# Patient Record
Sex: Female | Born: 1960 | Race: White | Hispanic: No | Marital: Married | Smoking: Former smoker
Health system: Southern US, Community
[De-identification: ages and names within clinical notes are randomized; demographics above are authoritative.]

## PROBLEM LIST (undated history)

## (undated) DIAGNOSIS — M1990 Unspecified osteoarthritis, unspecified site: Secondary | ICD-10-CM

## (undated) DIAGNOSIS — Z923 Personal history of irradiation: Secondary | ICD-10-CM

## (undated) DIAGNOSIS — M810 Age-related osteoporosis without current pathological fracture: Secondary | ICD-10-CM

## (undated) DIAGNOSIS — F419 Anxiety disorder, unspecified: Secondary | ICD-10-CM

## (undated) DIAGNOSIS — F32A Depression, unspecified: Secondary | ICD-10-CM

## (undated) DIAGNOSIS — Z862 Personal history of diseases of the blood and blood-forming organs and certain disorders involving the immune mechanism: Secondary | ICD-10-CM

## (undated) DIAGNOSIS — Z9221 Personal history of antineoplastic chemotherapy: Secondary | ICD-10-CM

## (undated) DIAGNOSIS — Z8619 Personal history of other infectious and parasitic diseases: Secondary | ICD-10-CM

## (undated) DIAGNOSIS — G709 Myoneural disorder, unspecified: Secondary | ICD-10-CM

## (undated) DIAGNOSIS — D649 Anemia, unspecified: Secondary | ICD-10-CM

## (undated) DIAGNOSIS — I447 Left bundle-branch block, unspecified: Secondary | ICD-10-CM

## (undated) DIAGNOSIS — T751XXA Unspecified effects of drowning and nonfatal submersion, initial encounter: Secondary | ICD-10-CM

## (undated) DIAGNOSIS — R011 Cardiac murmur, unspecified: Secondary | ICD-10-CM

## (undated) DIAGNOSIS — Z8669 Personal history of other diseases of the nervous system and sense organs: Secondary | ICD-10-CM

## (undated) DIAGNOSIS — F17200 Nicotine dependence, unspecified, uncomplicated: Secondary | ICD-10-CM

## (undated) DIAGNOSIS — T7840XA Allergy, unspecified, initial encounter: Secondary | ICD-10-CM

## (undated) DIAGNOSIS — C50919 Malignant neoplasm of unspecified site of unspecified female breast: Secondary | ICD-10-CM

## (undated) DIAGNOSIS — Z8659 Personal history of other mental and behavioral disorders: Secondary | ICD-10-CM

## (undated) DIAGNOSIS — C50912 Malignant neoplasm of unspecified site of left female breast: Secondary | ICD-10-CM

## (undated) HISTORY — PX: POLYPECTOMY: SHX149

## (undated) HISTORY — PX: TUBAL LIGATION: SHX77

## (undated) HISTORY — DX: Personal history of other mental and behavioral disorders: Z86.59

## (undated) HISTORY — DX: Malignant neoplasm of unspecified site of unspecified female breast: C50.919

## (undated) HISTORY — DX: Myoneural disorder, unspecified: G70.9

## (undated) HISTORY — DX: Personal history of other diseases of the nervous system and sense organs: Z86.69

## (undated) HISTORY — DX: Depression, unspecified: F32.A

## (undated) HISTORY — DX: Unspecified osteoarthritis, unspecified site: M19.90

## (undated) HISTORY — DX: Personal history of other infectious and parasitic diseases: Z86.19

## (undated) HISTORY — DX: Allergy, unspecified, initial encounter: T78.40XA

## (undated) HISTORY — DX: Anemia, unspecified: D64.9

## (undated) HISTORY — DX: Cardiac murmur, unspecified: R01.1

## (undated) HISTORY — DX: Age-related osteoporosis without current pathological fracture: M81.0

## (undated) HISTORY — DX: Unspecified effects of drowning and nonfatal submersion, initial encounter: T75.1XXA

## (undated) HISTORY — DX: Nicotine dependence, unspecified, uncomplicated: F17.200

## (undated) HISTORY — DX: Anxiety disorder, unspecified: F41.9

## (undated) HISTORY — DX: Personal history of diseases of the blood and blood-forming organs and certain disorders involving the immune mechanism: Z86.2

## (undated) HISTORY — DX: Malignant neoplasm of unspecified site of left female breast: C50.912

---
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
## 1985-11-04 HISTORY — PX: TONSILLECTOMY: SUR1361

## 1995-11-05 HISTORY — PX: KNEE SURGERY: SHX244

## 1999-10-04 ENCOUNTER — Emergency Department (HOSPITAL_COMMUNITY): Admission: EM | Admit: 1999-10-04 | Discharge: 1999-10-04 | Payer: Self-pay | Admitting: Emergency Medicine

## 2001-11-04 DIAGNOSIS — C50912 Malignant neoplasm of unspecified site of left female breast: Secondary | ICD-10-CM

## 2001-11-04 HISTORY — PX: BREAST LUMPECTOMY: SHX2

## 2001-11-04 HISTORY — DX: Malignant neoplasm of unspecified site of left female breast: C50.912

## 2002-09-13 ENCOUNTER — Encounter: Admission: RE | Admit: 2002-09-13 | Discharge: 2002-09-13 | Payer: Self-pay | Admitting: Family Medicine

## 2002-09-13 ENCOUNTER — Encounter: Payer: Self-pay | Admitting: Family Medicine

## 2002-09-13 ENCOUNTER — Other Ambulatory Visit: Admission: RE | Admit: 2002-09-13 | Discharge: 2002-09-13 | Payer: Self-pay | Admitting: Diagnostic Radiology

## 2002-09-13 ENCOUNTER — Encounter (INDEPENDENT_AMBULATORY_CARE_PROVIDER_SITE_OTHER): Payer: Self-pay | Admitting: *Deleted

## 2002-09-21 ENCOUNTER — Ambulatory Visit (HOSPITAL_COMMUNITY): Admission: RE | Admit: 2002-09-21 | Discharge: 2002-09-21 | Payer: Self-pay | Admitting: *Deleted

## 2002-09-21 ENCOUNTER — Encounter: Payer: Self-pay | Admitting: *Deleted

## 2002-09-23 ENCOUNTER — Encounter: Payer: Self-pay | Admitting: *Deleted

## 2002-09-23 ENCOUNTER — Ambulatory Visit (HOSPITAL_COMMUNITY): Admission: RE | Admit: 2002-09-23 | Discharge: 2002-09-23 | Payer: Self-pay | Admitting: *Deleted

## 2002-09-27 ENCOUNTER — Encounter: Payer: Self-pay | Admitting: General Surgery

## 2002-09-27 ENCOUNTER — Ambulatory Visit (HOSPITAL_BASED_OUTPATIENT_CLINIC_OR_DEPARTMENT_OTHER): Admission: RE | Admit: 2002-09-27 | Discharge: 2002-09-27 | Payer: Self-pay | Admitting: General Surgery

## 2002-10-14 ENCOUNTER — Encounter: Admission: RE | Admit: 2002-10-14 | Discharge: 2002-10-14 | Payer: Self-pay | Admitting: General Surgery

## 2002-10-14 ENCOUNTER — Encounter: Payer: Self-pay | Admitting: General Surgery

## 2002-12-17 ENCOUNTER — Ambulatory Visit (HOSPITAL_COMMUNITY): Admission: RE | Admit: 2002-12-17 | Discharge: 2002-12-17 | Payer: Self-pay | Admitting: General Surgery

## 2002-12-17 ENCOUNTER — Encounter: Payer: Self-pay | Admitting: General Surgery

## 2002-12-23 ENCOUNTER — Encounter: Admission: RE | Admit: 2002-12-23 | Discharge: 2002-12-23 | Payer: Self-pay | Admitting: General Surgery

## 2002-12-23 ENCOUNTER — Encounter: Payer: Self-pay | Admitting: General Surgery

## 2002-12-27 ENCOUNTER — Encounter (INDEPENDENT_AMBULATORY_CARE_PROVIDER_SITE_OTHER): Payer: Self-pay | Admitting: *Deleted

## 2002-12-27 ENCOUNTER — Ambulatory Visit (HOSPITAL_BASED_OUTPATIENT_CLINIC_OR_DEPARTMENT_OTHER): Admission: RE | Admit: 2002-12-27 | Discharge: 2002-12-27 | Payer: Self-pay | Admitting: General Surgery

## 2002-12-27 ENCOUNTER — Encounter: Admission: RE | Admit: 2002-12-27 | Discharge: 2002-12-27 | Payer: Self-pay | Admitting: General Surgery

## 2002-12-27 ENCOUNTER — Encounter: Payer: Self-pay | Admitting: General Surgery

## 2003-01-06 ENCOUNTER — Ambulatory Visit: Admission: RE | Admit: 2003-01-06 | Discharge: 2003-01-17 | Payer: Self-pay | Admitting: Radiation Oncology

## 2003-01-12 ENCOUNTER — Encounter: Admission: RE | Admit: 2003-01-12 | Discharge: 2003-01-12 | Payer: Self-pay | Admitting: Radiation Oncology

## 2003-01-18 ENCOUNTER — Ambulatory Visit (HOSPITAL_BASED_OUTPATIENT_CLINIC_OR_DEPARTMENT_OTHER): Admission: RE | Admit: 2003-01-18 | Discharge: 2003-01-18 | Payer: Self-pay | Admitting: General Surgery

## 2003-01-18 ENCOUNTER — Encounter: Payer: Self-pay | Admitting: General Surgery

## 2003-01-18 ENCOUNTER — Encounter (INDEPENDENT_AMBULATORY_CARE_PROVIDER_SITE_OTHER): Payer: Self-pay | Admitting: *Deleted

## 2003-01-18 ENCOUNTER — Encounter: Admission: RE | Admit: 2003-01-18 | Discharge: 2003-01-18 | Payer: Self-pay | Admitting: General Surgery

## 2003-01-24 ENCOUNTER — Ambulatory Visit: Admission: RE | Admit: 2003-01-24 | Discharge: 2003-04-06 | Payer: Self-pay | Admitting: Radiation Oncology

## 2003-03-19 ENCOUNTER — Encounter: Payer: Self-pay | Admitting: *Deleted

## 2003-03-19 ENCOUNTER — Inpatient Hospital Stay (HOSPITAL_COMMUNITY): Admission: EM | Admit: 2003-03-19 | Discharge: 2003-03-21 | Payer: Self-pay | Admitting: Emergency Medicine

## 2003-03-31 ENCOUNTER — Ambulatory Visit (HOSPITAL_COMMUNITY): Admission: RE | Admit: 2003-03-31 | Discharge: 2003-03-31 | Payer: Self-pay | Admitting: Radiation Oncology

## 2003-04-18 ENCOUNTER — Encounter: Payer: Self-pay | Admitting: Oncology

## 2003-04-18 ENCOUNTER — Ambulatory Visit (HOSPITAL_COMMUNITY): Admission: RE | Admit: 2003-04-18 | Discharge: 2003-04-18 | Payer: Self-pay | Admitting: Oncology

## 2003-05-05 ENCOUNTER — Ambulatory Visit: Admission: RE | Admit: 2003-05-05 | Discharge: 2003-05-05 | Payer: Self-pay | Admitting: Radiation Oncology

## 2003-06-27 ENCOUNTER — Ambulatory Visit (HOSPITAL_BASED_OUTPATIENT_CLINIC_OR_DEPARTMENT_OTHER): Admission: RE | Admit: 2003-06-27 | Discharge: 2003-06-27 | Payer: Self-pay | Admitting: General Surgery

## 2003-09-20 ENCOUNTER — Encounter: Admission: RE | Admit: 2003-09-20 | Discharge: 2003-09-20 | Payer: Self-pay | Admitting: Oncology

## 2003-12-08 ENCOUNTER — Ambulatory Visit: Admission: RE | Admit: 2003-12-08 | Discharge: 2003-12-08 | Payer: Self-pay | Admitting: Radiation Oncology

## 2004-09-03 ENCOUNTER — Encounter: Admission: RE | Admit: 2004-09-03 | Discharge: 2004-09-03 | Payer: Self-pay | Admitting: Oncology

## 2004-10-25 ENCOUNTER — Ambulatory Visit: Payer: Self-pay | Admitting: Oncology

## 2004-10-25 ENCOUNTER — Ambulatory Visit (HOSPITAL_COMMUNITY): Admission: RE | Admit: 2004-10-25 | Discharge: 2004-10-25 | Payer: Self-pay | Admitting: Oncology

## 2004-11-23 ENCOUNTER — Encounter: Admission: RE | Admit: 2004-11-23 | Discharge: 2004-11-23 | Payer: Self-pay | Admitting: Otolaryngology

## 2004-12-28 ENCOUNTER — Ambulatory Visit: Payer: Self-pay | Admitting: Oncology

## 2005-01-17 ENCOUNTER — Encounter: Admission: RE | Admit: 2005-01-17 | Discharge: 2005-01-17 | Payer: Self-pay | Admitting: Otolaryngology

## 2005-04-08 ENCOUNTER — Ambulatory Visit: Payer: Self-pay | Admitting: Oncology

## 2005-07-11 ENCOUNTER — Ambulatory Visit (HOSPITAL_COMMUNITY): Admission: RE | Admit: 2005-07-11 | Discharge: 2005-07-11 | Payer: Self-pay | Admitting: Oncology

## 2005-07-24 ENCOUNTER — Ambulatory Visit: Payer: Self-pay | Admitting: Oncology

## 2005-08-01 ENCOUNTER — Ambulatory Visit (HOSPITAL_COMMUNITY): Admission: RE | Admit: 2005-08-01 | Discharge: 2005-08-01 | Payer: Self-pay | Admitting: Oncology

## 2005-08-02 ENCOUNTER — Ambulatory Visit (HOSPITAL_COMMUNITY): Admission: RE | Admit: 2005-08-02 | Discharge: 2005-08-02 | Payer: Self-pay | Admitting: Oncology

## 2005-08-15 ENCOUNTER — Encounter: Admission: RE | Admit: 2005-08-15 | Discharge: 2005-08-15 | Payer: Self-pay | Admitting: General Surgery

## 2005-09-09 ENCOUNTER — Ambulatory Visit: Payer: Self-pay | Admitting: Oncology

## 2005-12-09 ENCOUNTER — Ambulatory Visit: Payer: Self-pay | Admitting: Oncology

## 2006-02-03 ENCOUNTER — Ambulatory Visit: Admission: RE | Admit: 2006-02-03 | Discharge: 2006-03-17 | Payer: Self-pay | Admitting: Radiation Oncology

## 2006-04-02 ENCOUNTER — Ambulatory Visit: Payer: Self-pay | Admitting: Oncology

## 2006-05-08 ENCOUNTER — Encounter: Admission: RE | Admit: 2006-05-08 | Discharge: 2006-05-08 | Payer: Self-pay | Admitting: Oncology

## 2006-08-18 ENCOUNTER — Encounter: Admission: RE | Admit: 2006-08-18 | Discharge: 2006-08-18 | Payer: Self-pay | Admitting: Oncology

## 2006-08-21 ENCOUNTER — Ambulatory Visit: Payer: Self-pay | Admitting: Oncology

## 2006-09-16 ENCOUNTER — Encounter: Admission: RE | Admit: 2006-09-16 | Discharge: 2006-09-16 | Payer: Self-pay | Admitting: Oncology

## 2006-12-24 ENCOUNTER — Ambulatory Visit: Payer: Self-pay | Admitting: Oncology

## 2007-02-03 ENCOUNTER — Emergency Department: Payer: Self-pay | Admitting: Emergency Medicine

## 2007-06-18 ENCOUNTER — Emergency Department: Payer: Self-pay

## 2007-06-28 ENCOUNTER — Ambulatory Visit: Payer: Self-pay | Admitting: Oncology

## 2007-09-09 ENCOUNTER — Encounter: Admission: RE | Admit: 2007-09-09 | Discharge: 2007-09-09 | Payer: Self-pay | Admitting: Oncology

## 2007-09-19 ENCOUNTER — Emergency Department (HOSPITAL_COMMUNITY): Admission: EM | Admit: 2007-09-19 | Discharge: 2007-09-19 | Payer: Self-pay | Admitting: Emergency Medicine

## 2007-10-16 ENCOUNTER — Ambulatory Visit: Payer: Self-pay | Admitting: Oncology

## 2007-10-20 LAB — TSH: TSH: 2.012 u[IU]/mL (ref 0.350–5.500)

## 2008-01-14 ENCOUNTER — Ambulatory Visit: Payer: Self-pay | Admitting: Oncology

## 2008-01-14 ENCOUNTER — Ambulatory Visit (HOSPITAL_COMMUNITY): Admission: RE | Admit: 2008-01-14 | Discharge: 2008-01-14 | Payer: Self-pay | Admitting: Oncology

## 2008-01-20 ENCOUNTER — Ambulatory Visit (HOSPITAL_COMMUNITY): Admission: RE | Admit: 2008-01-20 | Discharge: 2008-01-20 | Payer: Self-pay | Admitting: Oncology

## 2008-01-25 ENCOUNTER — Ambulatory Visit (HOSPITAL_COMMUNITY): Admission: RE | Admit: 2008-01-25 | Discharge: 2008-01-25 | Payer: Self-pay | Admitting: Oncology

## 2008-02-05 LAB — CBC WITH DIFFERENTIAL/PLATELET
Eosinophils Absolute: 0 10*3/uL (ref 0.0–0.5)
HCT: 41.4 % (ref 34.8–46.6)
LYMPH%: 12 % — ABNORMAL LOW (ref 14.0–48.0)
MCV: 86.4 fL (ref 81.0–101.0)
MONO#: 0.8 10*3/uL (ref 0.1–0.9)
MONO%: 6.3 % (ref 0.0–13.0)
NEUT#: 10.3 10*3/uL — ABNORMAL HIGH (ref 1.5–6.5)
NEUT%: 81.1 % — ABNORMAL HIGH (ref 39.6–76.8)
Platelets: 318 10*3/uL (ref 145–400)
RBC: 4.79 10*6/uL (ref 3.70–5.32)
WBC: 12.6 10*3/uL — ABNORMAL HIGH (ref 3.9–10.0)

## 2008-02-05 LAB — COMPREHENSIVE METABOLIC PANEL
Alkaline Phosphatase: 75 U/L (ref 39–117)
BUN: 21 mg/dL (ref 6–23)
CO2: 26 mEq/L (ref 19–32)
Creatinine, Ser: 1.02 mg/dL (ref 0.40–1.20)
Glucose, Bld: 93 mg/dL (ref 70–99)
Total Bilirubin: 0.4 mg/dL (ref 0.3–1.2)
Total Protein: 7.7 g/dL (ref 6.0–8.3)

## 2008-02-05 LAB — CANCER ANTIGEN 27.29: CA 27.29: 21 U/mL (ref 0–39)

## 2008-02-05 LAB — LACTATE DEHYDROGENASE: LDH: 160 U/L (ref 94–250)

## 2008-02-26 ENCOUNTER — Emergency Department (HOSPITAL_COMMUNITY): Admission: EM | Admit: 2008-02-26 | Discharge: 2008-02-26 | Payer: Self-pay | Admitting: Emergency Medicine

## 2008-03-01 ENCOUNTER — Ambulatory Visit: Payer: Self-pay | Admitting: Oncology

## 2008-03-10 ENCOUNTER — Ambulatory Visit (HOSPITAL_COMMUNITY): Admission: RE | Admit: 2008-03-10 | Discharge: 2008-03-10 | Payer: Self-pay | Admitting: Oncology

## 2008-03-14 ENCOUNTER — Ambulatory Visit: Payer: Self-pay | Admitting: Oncology

## 2008-06-09 ENCOUNTER — Ambulatory Visit: Payer: Self-pay | Admitting: Oncology

## 2008-06-23 ENCOUNTER — Other Ambulatory Visit: Admission: RE | Admit: 2008-06-23 | Discharge: 2008-06-23 | Payer: Self-pay | Admitting: Gynecology

## 2008-09-09 ENCOUNTER — Encounter: Admission: RE | Admit: 2008-09-09 | Discharge: 2008-09-09 | Payer: Self-pay | Admitting: Oncology

## 2008-09-09 ENCOUNTER — Ambulatory Visit: Payer: Self-pay | Admitting: Oncology

## 2008-09-14 ENCOUNTER — Ambulatory Visit: Payer: Self-pay | Admitting: Gynecology

## 2009-03-14 ENCOUNTER — Ambulatory Visit: Payer: Self-pay | Admitting: Oncology

## 2009-05-12 ENCOUNTER — Ambulatory Visit: Payer: Self-pay | Admitting: Genetic Counselor

## 2009-09-11 ENCOUNTER — Encounter: Admission: RE | Admit: 2009-09-11 | Discharge: 2009-09-11 | Payer: Self-pay | Admitting: Oncology

## 2009-09-22 ENCOUNTER — Encounter
Admission: RE | Admit: 2009-09-22 | Discharge: 2009-11-01 | Payer: Self-pay | Admitting: Physical Medicine & Rehabilitation

## 2009-09-25 ENCOUNTER — Ambulatory Visit (HOSPITAL_COMMUNITY)
Admission: RE | Admit: 2009-09-25 | Discharge: 2009-09-25 | Payer: Self-pay | Admitting: Physical Medicine and Rehabilitation

## 2009-09-25 ENCOUNTER — Ambulatory Visit: Payer: Self-pay | Admitting: Physical Medicine and Rehabilitation

## 2010-03-14 ENCOUNTER — Ambulatory Visit: Payer: Self-pay | Admitting: Oncology

## 2010-07-14 ENCOUNTER — Emergency Department: Payer: Self-pay | Admitting: Emergency Medicine

## 2010-09-06 ENCOUNTER — Ambulatory Visit: Payer: Self-pay | Admitting: Oncology

## 2010-10-05 ENCOUNTER — Encounter: Admission: RE | Admit: 2010-10-05 | Discharge: 2010-10-05 | Payer: Self-pay | Admitting: Oncology

## 2010-10-11 ENCOUNTER — Encounter
Admission: RE | Admit: 2010-10-11 | Discharge: 2010-10-11 | Payer: Self-pay | Source: Home / Self Care | Attending: Oncology | Admitting: Oncology

## 2010-11-04 HISTORY — PX: BREAST BIOPSY: SHX20

## 2010-11-14 ENCOUNTER — Emergency Department (HOSPITAL_COMMUNITY)
Admission: EM | Admit: 2010-11-14 | Discharge: 2010-11-14 | Payer: Self-pay | Source: Home / Self Care | Admitting: Emergency Medicine

## 2010-11-24 ENCOUNTER — Encounter: Payer: Self-pay | Admitting: Oncology

## 2010-12-11 ENCOUNTER — Encounter (HOSPITAL_BASED_OUTPATIENT_CLINIC_OR_DEPARTMENT_OTHER): Payer: Self-pay | Admitting: Oncology

## 2010-12-11 DIAGNOSIS — Z853 Personal history of malignant neoplasm of breast: Secondary | ICD-10-CM

## 2011-02-11 ENCOUNTER — Emergency Department (HOSPITAL_COMMUNITY)
Admission: EM | Admit: 2011-02-11 | Discharge: 2011-02-11 | Disposition: A | Discharge status: Home / Self Care | Payer: Self-pay | Attending: Emergency Medicine | Admitting: Emergency Medicine

## 2011-02-11 ENCOUNTER — Emergency Department (HOSPITAL_COMMUNITY): Payer: Self-pay

## 2011-02-11 DIAGNOSIS — Z853 Personal history of malignant neoplasm of breast: Secondary | ICD-10-CM | POA: Insufficient documentation

## 2011-02-11 DIAGNOSIS — M79609 Pain in unspecified limb: Secondary | ICD-10-CM | POA: Insufficient documentation

## 2011-03-22 NOTE — Op Note (Signed)
   NAMESHERRI, MCARTHY                          ACCOUNT NO.:  [id number]   MEDICAL RECORD NO.:  [id number]                   PATIENT TYPE:  AMB   LOCATION:  DSC                                  FACILITY:  [facility]   PHYSICIAN:  Rose Phi. Maple Hudson, M.D.                DATE OF BIRTH:  10-10-1961   DATE OF PROCEDURE:  01/18/2003  DATE OF DISCHARGE:                                 OPERATIVE REPORT   PREOPERATIVE DIAGNOSIS:  Residual intramammary node, left breast.   POSTOPERATIVE DIAGNOSIS:  Residual intramammary node, left breast.   OPERATION PERFORMED:  Excision of residual intramammary node, left breast  with needle localization and specimen mammography.   SURGEON:  Rose Phi. Maple Hudson, M.D.   ANESTHESIA:  MAC.   DESCRIPTION OF PROCEDURE:  Prior to coming to the operating room, the  intramammary node of the upper outer quadrant of the left breast had been  localized with a wire.  The patient was placed on the operating table with  the left arm extended on the arm board.  The left breast was prepped and  draped in the usual fashion.  A curved incision was then outlined using the  previously placed wire as a point.  The area was infiltrated with 1%  Xylocaine with Adrenalin.  The incision was made and the wire delivered into  the incision and then the wire and surrounding tissue excised.  Specimen  mammography confirmed the removal of the nodule.  Hemostasis obtained with  the cautery.  Subcuticular closure with 4-0 Monocryl and Steri-Strips  carried out.  Dressing applied.  The patient was then transferred to the  recovery room in satisfactory condition having tolerated the procedure well.                                                Rose Phi. Maple Hudson, M.D.    PRY/MEDQ  D:  01/18/2003  T:  01/18/2003  Job:  [id number]

## 2011-03-22 NOTE — Discharge Summary (Signed)
   Sabrina Wright, Sabrina Wright                          ACCOUNT NO.:  [id number]   MEDICAL RECORD NO.:  [id number]                   PATIENT TYPE:  INP   LOCATION:  0281                                 FACILITY:  [facility]   PHYSICIAN:  Merilynn Finland, M.D.                DATE OF BIRTH:  10/04/1961   DATE OF ADMISSION:  03/19/2003  DATE OF DISCHARGE:  03/21/2003                                 DISCHARGE SUMMARY   DISCHARGE SUMMARY:  Sabrina Wright is a very pleasant 50 year old female with a  history of breast cancer who had undergone radiation therapy and completion  of all of her adjunct treatment who had a subacute onset on the day prior to  admission of back pain radiating to both shoulders.  It was worse when she  had a cough or a deep breath.  She was admitted and had a thorough work up  including chest x-ray, a VQ scan and CT scan all of these things revealed no  abnormality.  EKG was normal.  She was given prednisone with a great deal of  relief of her pain and she was discharged home on 60 mg a day of prednisone  and Percocet as needed for pain.  She was discharged in stable condition and  she will followup with Dr. Aliene Altes one week after discharge.                                                Merilynn Finland, M.D.    JSS/MEDQ  D:  03/30/2003  T:  03/30/2003  Job:  [id number]

## 2011-03-22 NOTE — Op Note (Signed)
   Sabrina Wright, Sabrina Wright                          ACCOUNT NO.:  [id number]   MEDICAL RECORD NO.:  [id number]                   PATIENT TYPE:  AMB   LOCATION:  DSC                                  FACILITY:  [facility]   PHYSICIAN:  Rose Phi. Maple Hudson, M.D.                DATE OF BIRTH:  1961/10/21   DATE OF PROCEDURE:  06/27/2003  DATE OF DISCHARGE:                                 OPERATIVE REPORT   PREOPERATIVE DIAGNOSIS:  Carcinoma of the left breast.   POSTOPERATIVE DIAGNOSIS:  Carcinoma of the left breast.   OPERATION:  Removal of porta-cath.   SURGEON:  Rose Phi. Maple Hudson, M.D.   ANESTHESIA:  Local.   DESCRIPTION OF PROCEDURE:  The patient was placed on the operating table and  the right upper  chest was prepped and draped in the usual sterile fashion.  Local infiltration with 1% Xylocaine with adrenalin was then used and the  incision was made and exposed the port.   I grasped the catheter and removed it and then elevated the port and divided  the 2 stitches holding it in place and slipped it out. There was no  bleeding. The wound was closed with subcuticular 4-0 Monocryl and Steri-  Strips. A dressing was applied. The patient was then allowed to go home.                                               Rose Phi. Maple Hudson, M.D.    PRY/MEDQ  D:  06/27/2003  T:  06/27/2003  Job:  [id number]

## 2011-03-22 NOTE — H&P (Signed)
   NAME:  Sabrina Wright, Sabrina Wright NO.:  [id number]   MEDICAL RECORD NO.:  [id number]                   PATIENT TYPE:  INP   LOCATION:                                       FACILITY:  [facility]   PHYSICIAN:  Merilynn Finland, M.D.                DATE OF BIRTH:  1960/12/21   DATE OF ADMISSION:  03/19/2003  DATE OF DISCHARGE:  03/21/2003                                HISTORY & PHYSICAL   HISTORY OF PRESENT ILLNESS:  The patient is a very pleasant 50 year old  female with a history of breast cancer now undergoing radiation therapy and  completion all of her adjutant treatment with subacute onset yesterday of  back pain radiating to both shoulders.  The pain is worse with a cough and  deep inspiration.  She has had no fever or chills, no leg pain, no  productive cough, no sweat.  She is admitted for further workup to include a  CT scan of her chest, an EKG and pain control.   PAST MEDICAL HISTORY:  1. Significant  for the above mentioned breast cancer.  2. Status post now lumpectomy, February 2004.   SOCIAL HISTORY:  Smoking history.  No alcohol.   MEDICATIONS:  Currently on no medications.   ALLERGIES:  No known drug allergies.   FAMILY HISTORY:  Noncontributory.   PHYSICAL EXAMINATION:  VITAL SIGNS:  She is afebrile.  Vitals are stable.  She is 98% on room air.  HEART:  Regular, rate and rhythm.  ABDOMEN:  Soft, nontender.  CHEST:  Clear.  EXTREMITIES:  Range of motion.  No edema.   IMPRESSION:  A 50 year old female with a history of breast cancer undergoing  radiation at this point with subacute onset of back pain radiating to both  shoulders.  At this point the differential would have to include pulmonary  emboli, as well as pneumonitis.  At this point she is having excruciating  pain.  We are going to get pain control.  We are going to get a CT scan of  her chest to rule out a pulmonary embolus and also an EKG.           Merilynn Finland, M.D.    JSS/MEDQ  D:  03/19/2003  T:  03/19/2003  Job:  [id number]

## 2011-03-22 NOTE — Op Note (Signed)
[name], Sabrina Wright                          ACCOUNT NO.:  [id number]   MEDICAL RECORD NO.:  [id number]                   PATIENT TYPE:  AMB   LOCATION:  DSC                                  FACILITY:  [facility]   PHYSICIAN:  Rose Phi. Maple Hudson, M.D.                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   PROCEDURES:  1. Blue dye injection.  2. Left partial mastectomy with needle localization and specimen     mammography.  3. Left sentinel lymph node biopsy.   SURGEON:  Rose Phi. Maple Hudson, M.D.   ANESTHESIA:  General.   DESCRIPTION OF PROCEDURE:  This 50 year old married female had presented in  November with a 6 cm carcinoma in the upper inner quadrant of her left  breast.  She received preop chemotherapy with a total clinical resolution of  the palpable tumor and absence of any enhancing tissue on MRI.  She is  scheduled now for a partial mastectomy and sentinel node biopsy.   Prior to coming to the operating room, 1 mCi of Technetium sulfur colloid  was injected intradermally as well as having a localization wire placed in  the upper inner quadrant to identify where the previously-placed clip was.   After suitable general anesthesia was induced, the patient was placed in the  supine position with the left arm extended on the arm board.  Lymphazurin  blue 5 mL was injected in the subareolar tissue.   After prepping and draping, a curved incision in the upper inner quadrant of  the left breast was made and wide excision of the wire and surrounding  tissue was carried out.  All the tissue looked normal.  It was oriented for  the pathologist.  Specimen mammography confirmed the removal of the clip,  and touch prep showed no evidence of tumor on the margins.   While that was being done, we scanned the left axilla with the Neoprobe and  there was one real hot spot.  A transverse axillary incision was made with  dissection through the subcutaneous tissues to the clavipectoral fascia.  A  hot and blue lymph node was identified, and it was removed and submitted as  a sentinel node.  There were no other palpable blue or hot nodes.  Touch  prep on the node was negative.   With that information and good hemostasis, all the incisions were closed  with 3-0 Vicryl and subcuticular 4-0 Monocryl and Steri-Strips.  Dressing  applied.  Patient transferred to the recovery room in satisfactory  condition, having tolerated the procedure well.  Rose Phi. Maple Hudson, M.D.    PRY/MEDQ  D:  12/27/2002  T:  12/27/2002  Job:  [id number]   cc:   Merilynn Finland, M.D.  [address] [place] [facility]  [place]  [place] [address]  Fax: [address]  [address]  [place]  [place] [address]  Fax: [phone]

## 2011-03-22 NOTE — Op Note (Signed)
   Sabrina Wright, Sabrina Wright                          ACCOUNT NO.:  [id number]   MEDICAL RECORD NO.:  [id number]                   PATIENT TYPE:  AMB   LOCATION:  DSC                                  FACILITY:  [facility]   PHYSICIAN:  Rose Phi. Maple Hudson, M.D.                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   OPERATION/PROCEDURE:  Insertion of Port-A-Cath.   SURGEON:  Rose Phi. Maple Hudson, M.D.   ANESTHESIA:  MAC.   DESCRIPTION OF PROCEDURE:  With a roll between the shoulders, this patient  was placed on the operating room table and the right upper chest and neck  prepped and draped in the usual fashion.  Under local anesthesia we  attempted on several occasions to do a right subclavian puncture but could  not accomplish it.  A right internal jugular puncture was then carried out  by Dr. Gelene Mink with the guide wire inserted with great ease.  We then made a transverse incision on the anterior chest wall and developed  a pocket for the implantable X-port. Then tunneled between the IJ puncture  site and the port and passed the catheter and then attached it to the port  and anchored the port in the pocket with two 2-0 Prolene sutures.  Catheter  length was measured to the fourth interspace and cut.  We then passed the  dilator and peel-away sheath over the wire and removed the wire and the  dilator and passed the catheter to the peel-away sheath and then removed it.  Proper positioning of the catheter tip.  That the system had no kinking was  confirmed by fluoroscopy.  Incision was then closed with 3-0 Vicryl and  subcuticular 4-0 Monocryl and Steri-Strips.  The system was then accessed, aspirated and then fully heparinized.  Dressings were then applied and the patient was  transferred to the recovery  room in satisfactory condition having tolerated the procedure well.                                                Rose Phi. Maple Hudson, M.D.    PRY/MEDQ  D:  09/27/2002  T:  09/27/2002  Job:  [id number]   cc:   Merilynn Finland, M.D.  [address] [place] [facility]  [place]  [place] [address]  Fax: [address]  [address]  [place]  [place] [address]  Fax: [phone]

## 2011-04-11 ENCOUNTER — Encounter (HOSPITAL_BASED_OUTPATIENT_CLINIC_OR_DEPARTMENT_OTHER): Payer: PRIVATE HEALTH INSURANCE | Admitting: Oncology

## 2011-04-11 ENCOUNTER — Other Ambulatory Visit: Payer: Self-pay | Admitting: Oncology

## 2011-04-11 DIAGNOSIS — D249 Benign neoplasm of unspecified breast: Secondary | ICD-10-CM

## 2011-04-11 DIAGNOSIS — R079 Chest pain, unspecified: Secondary | ICD-10-CM

## 2011-04-11 DIAGNOSIS — Z9889 Other specified postprocedural states: Secondary | ICD-10-CM

## 2011-04-11 DIAGNOSIS — Z853 Personal history of malignant neoplasm of breast: Secondary | ICD-10-CM

## 2011-05-02 ENCOUNTER — Emergency Department: Payer: Self-pay | Admitting: Emergency Medicine

## 2011-05-09 ENCOUNTER — Emergency Department: Payer: Self-pay | Admitting: Emergency Medicine

## 2011-07-30 LAB — BASIC METABOLIC PANEL WITH GFR
BUN: 10
CO2: 25
Chloride: 102
Creatinine, Ser: 0.92

## 2011-07-30 LAB — CBC
HCT: 39.9
Hemoglobin: 13.2
MCHC: 33.1
MCV: 88
Platelets: 401 — ABNORMAL HIGH
RBC: 4.54
RDW: 13.8
WBC: 12.3 — ABNORMAL HIGH

## 2011-07-30 LAB — D-DIMER, QUANTITATIVE: D-Dimer, Quant: 0.37

## 2011-07-30 LAB — BASIC METABOLIC PANEL
Calcium: 9.3
GFR calc Af Amer: >60
GFR calc non Af Amer: >60
Glucose, Bld: 133 — ABNORMAL HIGH
Potassium: 3.8
Sodium: 138

## 2011-07-30 LAB — DIFFERENTIAL
Basophils Absolute: 0.1
Basophils Relative: 1
Eosinophils Absolute: 0.1
Eosinophils Relative: 1
Lymphocytes Relative: 13
Lymphs Abs: 1.6
Monocytes Absolute: 0.8
Monocytes Relative: 6
Neutro Abs: 9.7 — ABNORMAL HIGH
Neutrophils Relative %: 79 — ABNORMAL HIGH

## 2011-09-30 ENCOUNTER — Telehealth: Payer: Self-pay | Admitting: Oncology

## 2011-09-30 NOTE — Telephone Encounter (Signed)
lmonvm advising the pt of her r/s md appt from dec to jan 2013@9 :30am

## 2011-10-07 ENCOUNTER — Ambulatory Visit
Admission: RE | Admit: 2011-10-07 | Discharge: 2011-10-07 | Disposition: A | Discharge status: Home / Self Care | Payer: PRIVATE HEALTH INSURANCE | Source: Ambulatory Visit | Attending: Oncology | Admitting: Oncology

## 2011-10-07 DIAGNOSIS — Z9889 Other specified postprocedural states: Secondary | ICD-10-CM

## 2011-10-15 ENCOUNTER — Ambulatory Visit: Payer: PRIVATE HEALTH INSURANCE | Admitting: Oncology

## 2011-11-05 DIAGNOSIS — Z8619 Personal history of other infectious and parasitic diseases: Secondary | ICD-10-CM

## 2011-11-05 HISTORY — PX: CARDIAC CATHETERIZATION: SHX172

## 2011-11-05 HISTORY — DX: Personal history of other infectious and parasitic diseases: Z86.19

## 2011-11-15 ENCOUNTER — Other Ambulatory Visit: Payer: Self-pay | Admitting: *Deleted

## 2011-11-15 ENCOUNTER — Ambulatory Visit (HOSPITAL_BASED_OUTPATIENT_CLINIC_OR_DEPARTMENT_OTHER): Payer: PRIVATE HEALTH INSURANCE | Admitting: Oncology

## 2011-11-15 ENCOUNTER — Telehealth: Payer: Self-pay | Admitting: Oncology

## 2011-11-15 VITALS — BP 125/69 | HR 57 | Temp 96.8°F | Ht 64.0 in | Wt 166.5 lb

## 2011-11-15 DIAGNOSIS — C50919 Malignant neoplasm of unspecified site of unspecified female breast: Secondary | ICD-10-CM

## 2011-11-15 DIAGNOSIS — R0789 Other chest pain: Secondary | ICD-10-CM

## 2011-11-15 DIAGNOSIS — Z853 Personal history of malignant neoplasm of breast: Secondary | ICD-10-CM

## 2011-11-15 NOTE — Telephone Encounter (Signed)
Mammogram ordered [facility] for [facility]. Order done by [employer]. Other order will be cancelled by Dr. Truett Perna

## 2011-11-15 NOTE — Progress Notes (Signed)
OFFICE PROGRESS NOTE   INTERVAL HISTORY:   She returns as scheduled. She feels well. The left neck lymph node is smaller. The node in the left axilla is unchanged. She continues to have discomfort at the lateral left breast and axillary areas. She has a good appetite. She has no other complaint. A bilateral mammogram on 10/07/2011 was negative.   Objective:  Vital signs in last 24 hours:  Blood pressure 125/69, pulse 57, temperature 96.8 F (36 C), temperature source Oral, height 5\' 4"  (1.626 m), weight 166 lb 8 oz (75.524 kg).    HEENT: Oropharynx without visible mass. Neck without mass. Lymphatics: There is a 1/2-1 cm mobile left anterior cervical node. No other cervical or supraclavicular nodes. No right axillary node. There is a 1/2-1 cm mobile high medial left axillary node Resp: Lungs clear bilaterally Cardio: Regular rate and rhythm GI: No hepatomegaly Vascular: No leg edema  Breast: Right breast without mass. Status post left lumpectomy. No evidence for local tumor recurrence. No discrete mass in either breast. Mild tenderness at the lateral aspect of the left breast and in the left axilla.    Medications: I have reviewed the patient's current medications.  Assessment/Plan: 1. Left-sided breast cancer diagnosed in November 2003. 2. Left neck and left axillary lymph nodes:  Stable. 3. Chronic left chest wall pain and tenderness. 4. Biopsy of an abnormality at the 12 o'clock position of the right breast December 2011 with benign pathology. 5. Increased pain with focal tenderness at the left lateral chest wall with CT suggestive of 9th rib fracture on January 25, 2008. 6. History of left anterior chest pain/tenderness with CT February 26, 2008 suggestive of an infectious infiltrate. 7.     Status post GYN evaluation by Dr. Audie Box for irregular bleeding   Disposition:  She remains in clinical remission from breast cancer. She will continue annual mammography. She will return  for an office visit in one year.   Lucile Shutters, MD  11/15/2011  12:28 PM

## 2011-11-17 ENCOUNTER — Emergency Department: Payer: Self-pay | Admitting: Emergency Medicine

## 2012-02-24 ENCOUNTER — Emergency Department: Payer: Self-pay | Admitting: Emergency Medicine

## 2012-06-09 ENCOUNTER — Inpatient Hospital Stay: Payer: Self-pay | Admitting: Internal Medicine

## 2012-06-09 LAB — CBC
HGB: 13.5 g/dL (ref 12.0–16.0)
Platelet: 302 10*3/uL (ref 150–440)
RBC: 4.52 10*6/uL (ref 3.80–5.20)
RDW: 13.7 % (ref 11.5–14.5)
WBC: 9.7 10*3/uL (ref 3.6–11.0)

## 2012-06-09 LAB — BASIC METABOLIC PANEL
Calcium, Total: 9 mg/dL (ref 8.5–10.1)
Chloride: 107 mmol/L (ref 98–107)
Co2: 23 mmol/L (ref 21–32)
EGFR (Non-African Amer.): >60
Osmolality: 281 (ref 275–301)
Potassium: 3.7 mmol/L (ref 3.5–5.1)
Sodium: 141 mmol/L (ref 136–145)

## 2012-06-09 LAB — TROPONIN I: Troponin-I: <0.02 ng/mL

## 2012-06-09 LAB — CK TOTAL AND CKMB (NOT AT ARMC)
CK, Total: 251 U/L — ABNORMAL HIGH (ref 21–215)
CK-MB: 2.2 ng/mL (ref 0.5–3.6)

## 2012-06-10 DIAGNOSIS — R079 Chest pain, unspecified: Secondary | ICD-10-CM

## 2012-06-10 LAB — LIPID PANEL
Cholesterol: 161 mg/dL (ref 0–200)
HDL Cholesterol: 48 mg/dL (ref 40–60)
Ldl Cholesterol, Calc: 97 mg/dL (ref 0–100)
VLDL Cholesterol, Calc: 16 mg/dL (ref 5–40)

## 2012-06-10 LAB — CBC WITH DIFFERENTIAL/PLATELET
Basophil #: 0 10*3/uL (ref 0.0–0.1)
HCT: 39.7 % (ref 35.0–47.0)
Lymphocyte #: 3 10*3/uL (ref 1.0–3.6)
Lymphocyte %: 31.5 %
MCHC: 33.5 g/dL (ref 32.0–36.0)
MCV: 90 fL (ref 80–100)
Monocyte %: 7.8 %
Neutrophil #: 5.1 10*3/uL (ref 1.4–6.5)
Neutrophil %: 53.4 %
Platelet: 284 10*3/uL (ref 150–440)
RDW: 13.5 % (ref 11.5–14.5)

## 2012-06-10 LAB — CK TOTAL AND CKMB (NOT AT ARMC)
CK, Total: 184 U/L (ref 21–215)
CK-MB: 1.4 ng/mL (ref 0.5–3.6)

## 2012-06-10 LAB — COMPREHENSIVE METABOLIC PANEL
Albumin: 3.4 g/dL (ref 3.4–5.0)
Alkaline Phosphatase: 95 U/L (ref 50–136)
Anion Gap: 6 — ABNORMAL LOW (ref 7–16)
BUN: 9 mg/dL (ref 7–18)
Bilirubin,Total: 0.4 mg/dL (ref 0.2–1.0)
Calcium, Total: 8.9 mg/dL (ref 8.5–10.1)
Co2: 27 mmol/L (ref 21–32)
Creatinine: 0.67 mg/dL (ref 0.60–1.30)
EGFR (Non-African Amer.): >60
Osmolality: 280 (ref 275–301)
Potassium: 4.3 mmol/L (ref 3.5–5.1)
SGPT (ALT): 19 U/L (ref 12–78)
Sodium: 141 mmol/L (ref 136–145)
Total Protein: 6.7 g/dL (ref 6.4–8.2)

## 2012-06-11 LAB — CBC
MCH: 30.5 pg (ref 26.0–34.0)
Platelet: 301 10*3/uL (ref 150–440)
RBC: 4.69 10*6/uL (ref 3.80–5.20)
RDW: 13.5 % (ref 11.5–14.5)
WBC: 9.2 10*3/uL (ref 3.6–11.0)

## 2012-06-11 LAB — BASIC METABOLIC PANEL
Anion Gap: 7 (ref 7–16)
BUN: 13 mg/dL (ref 7–18)
Chloride: 106 mmol/L (ref 98–107)
Co2: 26 mmol/L (ref 21–32)
Creatinine: 0.74 mg/dL (ref 0.60–1.30)
EGFR (African American): >60
EGFR (Non-African Amer.): >60
Osmolality: 278 (ref 275–301)
Potassium: 4.1 mmol/L (ref 3.5–5.1)
Sodium: 139 mmol/L (ref 136–145)

## 2012-06-11 LAB — PROTIME-INR
INR: 0.9
Prothrombin Time: 12.3 secs (ref 11.5–14.7)

## 2012-06-15 ENCOUNTER — Telehealth: Payer: Self-pay | Admitting: Cardiovascular Disease

## 2012-06-15 NOTE — Telephone Encounter (Signed)
Pt was notified.  

## 2012-06-15 NOTE — Telephone Encounter (Signed)
Called patient to set up follow-up from hospital visit and post cath.  Patient does not have insurance for outpatient appointments and did not wish to schedule anything at the time due to financial constraints at this time.  Patient would like to know the results from her stress test and cath and I told her I would see if we could call her with these results after MD signed off on them at the hospital. I also told her she should follow up with [facility] for primary care needs, since she did not have one.

## 2012-06-15 NOTE — Telephone Encounter (Signed)
I spent a long time talking with her and her husband about the results Abnormal stress test led to cardiac catheterization Cardiac catheterization showed mild diffuse disease, no significant stenosis, no intervention needed Encouraged her to stop smoking

## 2012-06-19 ENCOUNTER — Encounter: Payer: PRIVATE HEALTH INSURANCE | Admitting: Cardiovascular Disease

## 2012-07-11 ENCOUNTER — Emergency Department (HOSPITAL_COMMUNITY)
Admission: EM | Admit: 2012-07-11 | Discharge: 2012-07-11 | Disposition: A | Discharge status: Home / Self Care | Payer: PRIVATE HEALTH INSURANCE | Attending: Emergency Medicine | Admitting: Emergency Medicine

## 2012-07-11 ENCOUNTER — Encounter (HOSPITAL_COMMUNITY): Payer: Self-pay | Admitting: *Deleted

## 2012-07-11 ENCOUNTER — Emergency Department (HOSPITAL_COMMUNITY): Payer: PRIVATE HEALTH INSURANCE

## 2012-07-11 DIAGNOSIS — Z885 Allergy status to narcotic agent status: Secondary | ICD-10-CM | POA: Insufficient documentation

## 2012-07-11 DIAGNOSIS — R079 Chest pain, unspecified: Secondary | ICD-10-CM | POA: Insufficient documentation

## 2012-07-11 DIAGNOSIS — Z91012 Allergy to eggs: Secondary | ICD-10-CM | POA: Insufficient documentation

## 2012-07-11 DIAGNOSIS — F17200 Nicotine dependence, unspecified, uncomplicated: Secondary | ICD-10-CM | POA: Insufficient documentation

## 2012-07-11 DIAGNOSIS — Z853 Personal history of malignant neoplasm of breast: Secondary | ICD-10-CM | POA: Insufficient documentation

## 2012-07-11 HISTORY — DX: Left bundle-branch block, unspecified: I44.7

## 2012-07-11 LAB — CBC
HCT: 40.7 % (ref 36.0–46.0)
Hemoglobin: 13.6 g/dL (ref 12.0–15.0)
MCH: 29.9 pg (ref 26.0–34.0)
RBC: 4.55 MIL/uL (ref 3.87–5.11)

## 2012-07-11 LAB — POCT I-STAT, CHEM 8
Creatinine, Ser: 0.7 mg/dL (ref 0.50–1.10)
Glucose, Bld: 87 mg/dL (ref 70–99)
Hemoglobin: 14.3 g/dL (ref 12.0–15.0)
Potassium: 3.8 mEq/L (ref 3.5–5.1)

## 2012-07-11 MED ORDER — OXYCODONE-ACETAMINOPHEN 5-325 MG PO TABS: 1.0000 | ORAL_TABLET | Freq: Four times a day (QID) | ORAL | Status: AC | PRN

## 2012-07-11 MED ORDER — MORPHINE SULFATE 4 MG/ML IJ SOLN
4.0000 mg | Freq: Once | INTRAMUSCULAR | Status: AC
  Administered 2012-07-11: 4 mg via INTRAVENOUS
  Filled 2012-07-11: qty 1

## 2012-07-11 NOTE — ED Notes (Signed)
Patient transported to X-ray 

## 2012-07-11 NOTE — ED Notes (Signed)
Pt to ED c/o L chest pressure that radiates to underneath her L scapula accompanied by L arm pain and nausea.  She was seen at [facility] on 06/09/12, was given a cardiac cath (no blockage) and was dx with a LBBB.  Pt states for last 5 days she has exp L chest pressure again and the last 2 days it has started radiating to her L shoulder blade.  Pt came in the am b/c the pain is too great.  Pt has not taken asa or nitro today.

## 2012-07-11 NOTE — ED Notes (Signed)
Authorization for disclosure of Medical records signed by Debe Coder and witnessed by Guerry Minors, RN.

## 2012-07-11 NOTE — ED Provider Notes (Signed)
History     CSN: [id number]  Arrival date & time 07/11/12  0609   First MD Initiated Contact with Patient 07/11/12 [phone]      Chief Complaint  Patient presents with  . Chest Pain    (Consider location/radiation/quality/duration/timing/severity/associated sxs/prior treatment) Patient is a 51 y.o. female presenting with chest pain.  Chest Pain Pertinent negatives for primary symptoms include no shortness of breath, no abdominal pain, no nausea and no vomiting.  Pertinent negatives for associated symptoms include no numbness and no weakness.   patient presents with left sided chest pain. Recently seen for the same at [facility] and had a reported negative heart cath. She was told that she has left bundle branch block. The pain on the front of her chest is constant it is pressure. The pain in the back interscapular area is sharper and comes and goes. The pain had improved after August when she was seen but has returned now. She's had for the last 5 days. No relief with her medicines at home. No fevers. No cough. She's a history of breast cancer had radiation in the shoulder. She states she's been told some of the arteries are hardening in that arm. She has some chronic pain in the shoulder. The  Past Medical History  Diagnosis Date  . LBBB (left bundle branch block)   . Breast CA   . Cardiac catheterization as cause of abnormal reaction of patient, or of later complication, without mention of misadventure at time of procedure     Past Surgical History  Procedure Date  . Breast lumpectomy 2003    L  . Tonsillectomy     No family history on file.  History  Substance Use Topics  . Smoking status: Current Everyday Smoker -- 0.5 packs/day  . Smokeless tobacco: Not on file  . Alcohol Use: Yes     occasional    OB History    Grav Para Term Preterm Abortions TAB SAB Ect Mult Living                  Review of Systems  Constitutional: Negative for activity change and  appetite change.  HENT: Negative for neck stiffness.   Eyes: Negative for pain.  Respiratory: Negative for chest tightness and shortness of breath.   Cardiovascular: Positive for chest pain. Negative for leg swelling.  Gastrointestinal: Negative for nausea, vomiting, abdominal pain and diarrhea.  Genitourinary: Negative for flank pain.  Musculoskeletal: Negative for back pain.  Skin: Negative for rash.  Neurological: Negative for weakness, numbness and headaches.  Psychiatric/Behavioral: Negative for behavioral problems.    Allergies  Codeine and Eggs or egg-derived products  Home Medications   Current Outpatient Rx  Name Route Sig Dispense Refill  . ACETAMINOPHEN 500 MG PO TABS Oral Take 1,000 mg by mouth every 6 (six) hours as needed. pain    . ASPIRIN 325 MG PO TABS Oral Take 650 mg by mouth 2 (two) times daily as needed. pain    . MUSCLE RUB 10-15 % EX CREA Topical Apply 1 application topically as needed. pain    . ONE-DAILY MULTI VITAMINS PO TABS Oral Take 1 tablet by mouth daily.    . OXYCODONE-ACETAMINOPHEN 5-325 MG PO TABS Oral Take 1-2 tablets by mouth every 6 (six) hours as needed for pain. 10 tablet 0    BP 100/65  Pulse 52  Temp 97.9 F (36.6 C) (Oral)  Resp 17  Ht 5\' 4"  (1.626 m)  Wt 165 lb (  74.844 kg)  BMI 28.32 kg/m2  SpO2 99%  Physical Exam  Nursing note and vitals reviewed. Constitutional: She is oriented to person, place, and time. She appears well-developed and well-nourished.  HENT:  Head: Normocephalic and atraumatic.  Eyes: EOM are normal. Pupils are equal, round, and reactive to light.  Neck: Normal range of motion. Neck supple.  Cardiovascular: Normal rate, regular rhythm and normal heart sounds.   No murmur heard. Pulmonary/Chest: Effort normal and breath sounds normal. No respiratory distress. She has no wheezes. She has no rales.  Abdominal: Soft. Bowel sounds are normal. She exhibits no distension. There is no tenderness. There is no rebound  and no guarding.  Musculoskeletal: Normal range of motion.       Tenderness over left para scapular area. No rash. No mass.   Neurological: She is alert and oriented to person, place, and time. No cranial nerve deficit.  Skin: Skin is warm and dry.  Psychiatric: She has a normal mood and affect. Her speech is normal.    ED Course  Procedures (including critical care time)   Labs Reviewed  CBC  POCT I-STAT, CHEM 8  POCT I-STAT TROPONIN I  LAB REPORT - SCANNED   No results found.   1. Chest pain     Date: 07/13/2012  Rate: 75  Rhythm: normal sinus rhythm  QRS Axis: normal  Intervals: normal  ST/T Wave abnormalities: normal  Conduction Disutrbances:nonspecific intraventricular conduction delay  Narrative Interpretation:   Old EKG Reviewed: none available     MDM  Patient with chest pain. Recent negative heart cath in reviewed records from [facility], EKG and xray reassure. Will d/c with pain meds        Juliet Rude. Rubin Payor, MD 07/13/12 2107

## 2012-10-14 ENCOUNTER — Ambulatory Visit
Admission: RE | Admit: 2012-10-14 | Discharge: 2012-10-14 | Disposition: A | Discharge status: Home / Self Care | Payer: PRIVATE HEALTH INSURANCE | Source: Ambulatory Visit | Attending: Oncology | Admitting: Oncology

## 2012-10-14 DIAGNOSIS — Z853 Personal history of malignant neoplasm of breast: Secondary | ICD-10-CM

## 2012-11-13 ENCOUNTER — Telehealth: Payer: Self-pay | Admitting: Oncology

## 2012-11-13 ENCOUNTER — Ambulatory Visit (HOSPITAL_BASED_OUTPATIENT_CLINIC_OR_DEPARTMENT_OTHER): Payer: BC Managed Care – PPO | Admitting: Oncology

## 2012-11-13 VITALS — BP 123/67 | HR 62 | Temp 97.0°F | Resp 20 | Ht 64.0 in | Wt 166.2 lb

## 2012-11-13 DIAGNOSIS — Z853 Personal history of malignant neoplasm of breast: Secondary | ICD-10-CM

## 2012-11-13 DIAGNOSIS — Z1231 Encounter for screening mammogram for malignant neoplasm of breast: Secondary | ICD-10-CM

## 2012-11-13 DIAGNOSIS — C50919 Malignant neoplasm of unspecified site of unspecified female breast: Secondary | ICD-10-CM

## 2012-11-13 NOTE — Telephone Encounter (Signed)
gv and printed appt scheduel for pt for Dec 2014 and Jan 2015....mammo scheduled with Charlynne Pander for Dec 12th @ 9:30am

## 2012-11-13 NOTE — Progress Notes (Signed)
   [facility]    OFFICE PROGRESS NOTE   INTERVAL HISTORY:   She returns as scheduled. She feels well. Stable left chest wall pain. The left neck lymph node is smaller.  A bilateral mammogram 10/14/2012 was negative.  Objective:  Vital signs in last 24 hours:  Blood pressure 123/67, pulse 62, temperature 97 F (36.1 C), temperature source Oral, resp. rate 20, height 5\' 4"  (1.626 m), weight 166 lb 3.2 oz (75.388 kg).    HEENT: ? Slight fullness at the right compared to the left tonsillar fossa. Lymphatics: 1/2-1 cm mobile high left anterior cervical node. No other cervical, supraclavicular, or axillary nodes Resp: Distant breath sounds, clear bilaterally Cardio: Regular rate and rhythm GI: No hepatomegaly Vascular: No leg edema  Breasts: Right breast without mass. Status post left lumpectomy. No evidence for local tumor recurrence. No mass in either breast. Tenderness at the lateral aspect of the left breast and in the left axilla.     Medications: I have reviewed the patient's current medications.  Assessment/Plan: 1. Left-sided breast cancer diagnosed in November 2003. 2. History of Left neck and left axillary lymph nodes: No axillary adenopathy appreciated today. The left neck node appears stable to smaller. 3. Chronic left chest wall pain and tenderness. 4. Biopsy of an abnormality at the 12 o'clock position of the right breast December 2011 with benign pathology. 5. Increased pain with focal tenderness at the left lateral chest wall with CT suggestive of 9th rib fracture on January 25, 2008. 6. History of left anterior chest pain/tenderness with CT February 26, 2008 suggestive of an infectious infiltrate. 7. Status post GYN evaluation by Dr. Audie Box for irregular bleeding    Disposition:  She remains in clinical remission from breast cancer. She will be scheduled for a bilateral mammogram in December of 2014. Ms. Garrod will return for an office visit in one  year.   Thornton Papas, MD  11/13/2012  1:44 PM

## 2012-12-12 ENCOUNTER — Emergency Department: Payer: Self-pay | Admitting: Emergency Medicine

## 2012-12-12 LAB — URINALYSIS, COMPLETE
Glucose,UR: NEGATIVE mg/dL (ref 0–75)
Ketone: NEGATIVE
Ph: 5 (ref 4.5–8.0)
Specific Gravity: 1.025 (ref 1.003–1.030)
Squamous Epithelial: 12
WBC UR: 7 /HPF (ref 0–5)

## 2012-12-12 LAB — COMPREHENSIVE METABOLIC PANEL
Albumin: 3.9 g/dL (ref 3.4–5.0)
Anion Gap: 6 — ABNORMAL LOW (ref 7–16)
BUN: 10 mg/dL (ref 7–18)
Bilirubin,Total: 0.2 mg/dL (ref 0.2–1.0)
Chloride: 108 mmol/L — ABNORMAL HIGH (ref 98–107)
Co2: 29 mmol/L (ref 21–32)
EGFR (Non-African Amer.): >60
Osmolality: 283 (ref 275–301)
SGOT(AST): 28 U/L (ref 15–37)
Sodium: 143 mmol/L (ref 136–145)
Total Protein: 7.5 g/dL (ref 6.4–8.2)

## 2012-12-12 LAB — CBC
HCT: 40.8 % (ref 35.0–47.0)
HGB: 13.7 g/dL (ref 12.0–16.0)
MCH: 30.4 pg (ref 26.0–34.0)
MCHC: 33.7 g/dL (ref 32.0–36.0)
Platelet: 363 10*3/uL (ref 150–440)
RBC: 4.52 10*6/uL (ref 3.80–5.20)
WBC: 12 10*3/uL — ABNORMAL HIGH (ref 3.6–11.0)

## 2012-12-12 LAB — CK TOTAL AND CKMB (NOT AT ARMC)
CK, Total: 101 U/L (ref 21–215)
CK-MB: <0.5 ng/mL — ABNORMAL LOW (ref 0.5–3.6)

## 2012-12-12 LAB — CK: CK, Total: 102 U/L (ref 21–215)

## 2013-01-28 ENCOUNTER — Ambulatory Visit (INDEPENDENT_AMBULATORY_CARE_PROVIDER_SITE_OTHER): Payer: BC Managed Care – PPO | Admitting: Family Medicine

## 2013-01-28 ENCOUNTER — Encounter: Payer: Self-pay | Admitting: Family Medicine

## 2013-01-28 VITALS — BP 128/72 | HR 64 | Temp 97.5°F | Ht 64.0 in | Wt 166.8 lb

## 2013-01-28 DIAGNOSIS — C50912 Malignant neoplasm of unspecified site of left female breast: Secondary | ICD-10-CM

## 2013-01-28 DIAGNOSIS — F17200 Nicotine dependence, unspecified, uncomplicated: Secondary | ICD-10-CM

## 2013-01-28 DIAGNOSIS — D649 Anemia, unspecified: Secondary | ICD-10-CM | POA: Insufficient documentation

## 2013-01-28 DIAGNOSIS — M79602 Pain in left arm: Secondary | ICD-10-CM

## 2013-01-28 DIAGNOSIS — Z853 Personal history of malignant neoplasm of breast: Secondary | ICD-10-CM | POA: Insufficient documentation

## 2013-01-28 DIAGNOSIS — M79609 Pain in unspecified limb: Secondary | ICD-10-CM

## 2013-01-28 DIAGNOSIS — C50919 Malignant neoplasm of unspecified site of unspecified female breast: Secondary | ICD-10-CM

## 2013-01-28 DIAGNOSIS — M792 Neuralgia and neuritis, unspecified: Secondary | ICD-10-CM | POA: Insufficient documentation

## 2013-01-28 DIAGNOSIS — Z87891 Personal history of nicotine dependence: Secondary | ICD-10-CM | POA: Insufficient documentation

## 2013-01-28 MED ORDER — PREGABALIN 75 MG PO CAPS: 75.0000 mg | ORAL_CAPSULE | Freq: Two times a day (BID) | ORAL | Status: DC

## 2013-01-28 MED ORDER — TRAMADOL HCL 50 MG PO TABS: 50.0000 mg | ORAL_TABLET | Freq: Three times a day (TID) | ORAL | Status: DC | PRN

## 2013-01-28 NOTE — Assessment & Plan Note (Signed)
Followed by onc.  Stable.

## 2013-01-28 NOTE — Assessment & Plan Note (Signed)
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 Trial of lyrica - samples and coupon provided today. Also provided with tramadol prn breakthrough pain. rtc 1 mo for f/u. If failed, consider gabapentin trial again.
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
## 2013-01-28 NOTE — Assessment & Plan Note (Signed)
Contemplative. Provided with [facility] resources and encouraged cessation.

## 2013-01-28 NOTE — Patient Instructions (Addendum)
Let's start lyrica 75mg  twice daily for nerve pain of left arm. (samples and prescription provided today). May also use tramadol as needed for breakthrough pain - sent to pharmacy. Keep working on smoking cessation.  Look into [facility]com for resources to help you quit. Return in 1 month for follow up. Return in next few months for well woman exam, prior fasting for blood work.

## 2013-02-04 ENCOUNTER — Telehealth: Payer: Self-pay

## 2013-02-04 MED ORDER — GABAPENTIN 300 MG PO CAPS: 300.0000 mg | ORAL_CAPSULE | Freq: Three times a day (TID) | ORAL | Status: DC

## 2013-02-04 NOTE — Telephone Encounter (Signed)
Pt left v/m; pt has taken samples of Lyrica given; pt went to [facility] [address] to get prescription of Lyrica; cost $80 with discount card; pt cannot afford.pt request substitute med for Lyrica sent to [facility] [address].Please advise.

## 2013-02-04 NOTE — Telephone Encounter (Signed)
Patient notified

## 2013-02-04 NOTE — Telephone Encounter (Signed)
We can try gabapentin again - sent in. Take 300mg  nightly for 1 week then 300mg  bid for 1 week then may go up to 300mg  tid.  Sedation precautions.

## 2013-02-24 ENCOUNTER — Telehealth: Payer: Self-pay

## 2013-02-24 NOTE — Telephone Encounter (Signed)
Marylene Land, case mgr with [employer] left v/m with courtesy call  to let Dr Reece Agar know that pt is currently working with nurse case mgr re; disease management education. Marylene Land does not need call back.

## 2013-02-25 ENCOUNTER — Telehealth: Payer: Self-pay | Admitting: *Deleted

## 2013-02-25 ENCOUNTER — Encounter: Payer: Self-pay | Admitting: Family Medicine

## 2013-02-25 ENCOUNTER — Encounter: Payer: Self-pay | Admitting: *Deleted

## 2013-02-25 ENCOUNTER — Ambulatory Visit (INDEPENDENT_AMBULATORY_CARE_PROVIDER_SITE_OTHER): Payer: BC Managed Care – PPO | Admitting: Family Medicine

## 2013-02-25 VITALS — BP 110/70 | HR 60 | Temp 97.3°F | Ht 63.5 in | Wt 164.5 lb

## 2013-02-25 DIAGNOSIS — M79602 Pain in left arm: Secondary | ICD-10-CM

## 2013-02-25 DIAGNOSIS — M79609 Pain in unspecified limb: Secondary | ICD-10-CM

## 2013-02-25 MED ORDER — HYDROCODONE-ACETAMINOPHEN 5-325 MG PO TABS: 0.5000 | ORAL_TABLET | Freq: Three times a day (TID) | ORAL | Status: DC | PRN

## 2013-02-25 MED ORDER — AMITRIPTYLINE HCL 50 MG PO TABS: 50.0000 mg | ORAL_TABLET | Freq: Every day | ORAL | Status: DC

## 2013-02-25 NOTE — Telephone Encounter (Signed)
PA form for Lyrica is in your inbox. 

## 2013-02-25 NOTE — Patient Instructions (Addendum)
Return for physical. May stop gabapentin and tramadol. May try elavil (amitriptyline).  Start 25mg  nightly, then after 7 days increase to 50mg  nightly for 7 nights then may increase to 100mg  nightly. May use hydrocodone as needed for pain (1/2 to 1 tablet at a time). Price out lyrica at some other pharmacies, check with insurance as well as to why so expensive (discount card says $25/month).

## 2013-02-25 NOTE — Telephone Encounter (Signed)
Filled and placed in my kim's box.
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
## 2013-02-25 NOTE — Assessment & Plan Note (Signed)
Thought neuropathic, has had what sounds like comprehensive workup for other causes. Lyrica did help, but too expensive for patient - I have suggested she call around local pharmacies for best cost and check with her insurance.  I will fill out PA for lyrica in hopes of cheaper price Gabapentin/tramadol has not helped. Requests trial of elavil - will start this.   Will also provide with hydrocodone for pain as needed. OTC NSAIDs/tylenol have not been effective in past. Worsened pain with increased exertion at work - I have asked her to stay on light duty for next 2 weeks to see if resting shoulder and upper arm will improve pain.   May need to discuss permanent light duty.

## 2013-02-25 NOTE — Telephone Encounter (Signed)
PA form for Lyrica is in your inbox.

## 2013-02-26 NOTE — Telephone Encounter (Signed)
Form faxed. Will await determination. 

## 2013-03-03 NOTE — Telephone Encounter (Signed)
Apparently the patient doesn't have drug coverage/"pharmacy" benefits through [facility] only medical.

## 2013-03-04 ENCOUNTER — Telehealth: Payer: Self-pay | Admitting: Family Medicine

## 2013-03-04 DIAGNOSIS — Z0000 Encounter for general adult medical examination without abnormal findings: Secondary | ICD-10-CM

## 2013-03-04 NOTE — Telephone Encounter (Signed)
Message left notifying patient. Advised to call me back and let me know if she would like for me to try to get her some samples of Lyrica.

## 2013-03-04 NOTE — Telephone Encounter (Signed)
Message copied by Judy Pimple on Thu Mar 04, 2013  5:43 PM ------      Message from: Alvina Chou      Created: Thu Mar 04, 2013 11:18 AM      Regarding: Lab orders for Friday, 5.2.14       Patient is scheduled for CPX labs, please order future labs, Thanks , Terri                  Dr Timoteo Expose patient ------

## 2013-03-08 ENCOUNTER — Other Ambulatory Visit (INDEPENDENT_AMBULATORY_CARE_PROVIDER_SITE_OTHER): Payer: BC Managed Care – PPO

## 2013-03-08 DIAGNOSIS — D649 Anemia, unspecified: Secondary | ICD-10-CM

## 2013-03-08 DIAGNOSIS — Z0000 Encounter for general adult medical examination without abnormal findings: Secondary | ICD-10-CM

## 2013-03-08 LAB — CBC WITH DIFFERENTIAL/PLATELET
Basophils Absolute: 0.1 10*3/uL (ref 0.0–0.1)
Basophils Relative: 0.6 % (ref 0.0–3.0)
Eosinophils Absolute: 0.3 10*3/uL (ref 0.0–0.7)
Eosinophils Relative: 3.2 % (ref 0.0–5.0)
HCT: 43.7 % (ref 36.0–46.0)
Lymphs Abs: 2.5 10*3/uL (ref 0.7–4.0)
MCV: 89.8 fl (ref 78.0–100.0)
Neutro Abs: 5.6 10*3/uL (ref 1.4–7.7)
Platelets: 306 10*3/uL (ref 150.0–400.0)
RBC: 4.86 Mil/uL (ref 3.87–5.11)
WBC: 9.1 10*3/uL (ref 4.5–10.5)

## 2013-03-09 LAB — LIPID PANEL
Cholesterol: 181 mg/dL (ref 0–200)
HDL: 49.5 mg/dL (ref >39.00)
Triglycerides: 72 mg/dL (ref 0.0–149.0)
VLDL: 14.4 mg/dL (ref 0.0–40.0)

## 2013-03-10 ENCOUNTER — Encounter: Payer: Self-pay | Admitting: Family Medicine

## 2013-03-11 ENCOUNTER — Ambulatory Visit (INDEPENDENT_AMBULATORY_CARE_PROVIDER_SITE_OTHER): Payer: BC Managed Care – PPO | Admitting: Family Medicine

## 2013-03-11 ENCOUNTER — Encounter: Payer: Self-pay | Admitting: Family Medicine

## 2013-03-11 ENCOUNTER — Other Ambulatory Visit (HOSPITAL_COMMUNITY)
Admission: RE | Admit: 2013-03-11 | Discharge: 2013-03-11 | Disposition: A | Discharge status: Home / Self Care | Payer: BC Managed Care – PPO | Source: Ambulatory Visit | Attending: Family Medicine | Admitting: Family Medicine

## 2013-03-11 VITALS — BP 128/74 | HR 64 | Temp 97.8°F | Ht 64.0 in | Wt 163.8 lb

## 2013-03-11 DIAGNOSIS — M5412 Radiculopathy, cervical region: Secondary | ICD-10-CM

## 2013-03-11 DIAGNOSIS — Z0000 Encounter for general adult medical examination without abnormal findings: Secondary | ICD-10-CM

## 2013-03-11 DIAGNOSIS — Z1151 Encounter for screening for human papillomavirus (HPV): Secondary | ICD-10-CM | POA: Insufficient documentation

## 2013-03-11 DIAGNOSIS — N6320 Unspecified lump in the left breast, unspecified quadrant: Secondary | ICD-10-CM | POA: Insufficient documentation

## 2013-03-11 DIAGNOSIS — M792 Neuralgia and neuritis, unspecified: Secondary | ICD-10-CM

## 2013-03-11 DIAGNOSIS — Z1211 Encounter for screening for malignant neoplasm of colon: Secondary | ICD-10-CM

## 2013-03-11 DIAGNOSIS — N63 Unspecified lump in unspecified breast: Secondary | ICD-10-CM

## 2013-03-11 DIAGNOSIS — F17200 Nicotine dependence, unspecified, uncomplicated: Secondary | ICD-10-CM

## 2013-03-11 DIAGNOSIS — Z01419 Encounter for gynecological examination (general) (routine) without abnormal findings: Secondary | ICD-10-CM | POA: Insufficient documentation

## 2013-03-11 NOTE — Assessment & Plan Note (Signed)
Preventative protocols reviewed and updated unless pt declined. Discussed healthy diet and lifestyle. Pap and pelvic and breast exam today. sent home with iFOB today.

## 2013-03-11 NOTE — Patient Instructions (Addendum)
Pass by lab for stool kit Watch dry mouth on amitriptyline - if too severe, only take 100mg  nightly. Breast exam today.  Pass by Marion's office to make appointment for left diagnostic mammogram. Pap smear today. Return as needed or in 1 year for next physical.

## 2013-03-11 NOTE — Assessment & Plan Note (Signed)
Improved on amitriptyline 
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
## 2013-03-11 NOTE — Assessment & Plan Note (Signed)
Continue to encourage cessation. 

## 2013-03-11 NOTE — Assessment & Plan Note (Signed)
Small barely palpable nodule on left inferior breast.  ?cyst. Given personal hx, will obtain diagnostic mammogram of left breast to further evaluate. Pt agrees with plan.

## 2013-03-17 ENCOUNTER — Encounter: Payer: Self-pay | Admitting: *Deleted

## 2013-03-18 ENCOUNTER — Other Ambulatory Visit: Payer: Self-pay

## 2013-03-18 MED ORDER — AMITRIPTYLINE HCL 50 MG PO TABS: 100.0000 mg | ORAL_TABLET | Freq: Every day | ORAL | Status: DC

## 2013-03-18 NOTE — Telephone Encounter (Signed)
plz notify I sent in new sig

## 2013-03-18 NOTE — Telephone Encounter (Signed)
Pt left v/m that Dr Sharen Hones had advised pt to increase amityriptyline 50 mg from taking one tab to taking 2 tabs at bedtime. Pt said [facility] had refill available but was for one tab at bedtime.Please advise.

## 2013-03-19 NOTE — Telephone Encounter (Signed)
Advised patient script has been sent to pharmacy. 

## 2013-03-23 ENCOUNTER — Ambulatory Visit
Admission: RE | Admit: 2013-03-23 | Discharge: 2013-03-23 | Disposition: A | Discharge status: Home / Self Care | Payer: BC Managed Care – PPO | Source: Ambulatory Visit | Attending: Family Medicine | Admitting: Family Medicine

## 2013-03-23 DIAGNOSIS — N6320 Unspecified lump in the left breast, unspecified quadrant: Secondary | ICD-10-CM

## 2013-03-24 ENCOUNTER — Encounter: Payer: Self-pay | Admitting: *Deleted

## 2013-04-04 ENCOUNTER — Encounter: Payer: Self-pay | Admitting: Family Medicine

## 2013-08-06 ENCOUNTER — Telehealth: Payer: Self-pay

## 2013-08-06 MED ORDER — AMITRIPTYLINE HCL 50 MG PO TABS: 100.0000 mg | ORAL_TABLET | Freq: Every day | ORAL | Status: DC

## 2013-08-06 NOTE — Telephone Encounter (Signed)
Notified patient that medication was sent into pharmacy

## 2013-08-06 NOTE — Telephone Encounter (Signed)
Pt called back and Lyrica cost to pt is $200.00 and still too expensive; pt request  Amitriptyline refill faxed to [facility];fax # [phone].pt request cb when refilled. (Pt taking amitriptyline for nerve damage to lt arm).

## 2013-08-06 NOTE — Telephone Encounter (Signed)
plz notify amitriptyline sent in

## 2013-08-06 NOTE — Telephone Encounter (Signed)
Pt said the amitriptyline is not as effective as Lyrica; pt has spoken with ins co and pt thinks Lyrica will be more affordable now and request to stop amitriptyline and start Lyrica. [facility]. Pt request cb. Pt wants to call ins co again to verify cost of Lyrica before submit this request. Pt will cb.

## 2013-10-06 ENCOUNTER — Telehealth: Payer: Self-pay

## 2013-10-06 DIAGNOSIS — E28319 Asymptomatic premature menopause: Secondary | ICD-10-CM | POA: Insufficient documentation

## 2013-10-06 NOTE — Telephone Encounter (Signed)
plz notify dexa scan ordered.

## 2013-10-06 NOTE — Telephone Encounter (Signed)
Patient notified

## 2013-10-06 NOTE — Telephone Encounter (Signed)
Pt left v/m pt is scheduled to have mammogram on 10/15/13; pt received letter from breast center and recommend pt to have bone density test this year. Pt request order for bone density.Please advise.

## 2013-10-15 ENCOUNTER — Ambulatory Visit: Payer: BC Managed Care – PPO

## 2013-11-04 DIAGNOSIS — M810 Age-related osteoporosis without current pathological fracture: Secondary | ICD-10-CM

## 2013-11-04 HISTORY — DX: Age-related osteoporosis without current pathological fracture: M81.0

## 2013-11-10 ENCOUNTER — Ambulatory Visit
Admission: RE | Admit: 2013-11-10 | Discharge: 2013-11-10 | Disposition: A | Discharge status: Home / Self Care | Payer: BC Managed Care – PPO | Source: Ambulatory Visit | Attending: Oncology | Admitting: Oncology

## 2013-11-10 DIAGNOSIS — Z1231 Encounter for screening mammogram for malignant neoplasm of breast: Secondary | ICD-10-CM

## 2013-11-12 ENCOUNTER — Encounter (INDEPENDENT_AMBULATORY_CARE_PROVIDER_SITE_OTHER): Payer: Self-pay

## 2013-11-12 ENCOUNTER — Ambulatory Visit (HOSPITAL_BASED_OUTPATIENT_CLINIC_OR_DEPARTMENT_OTHER): Payer: BC Managed Care – PPO | Admitting: Oncology

## 2013-11-12 VITALS — BP 135/72 | HR 85 | Temp 97.2°F | Resp 18 | Ht 64.0 in | Wt 151.5 lb

## 2013-11-12 DIAGNOSIS — C50912 Malignant neoplasm of unspecified site of left female breast: Secondary | ICD-10-CM

## 2013-11-12 DIAGNOSIS — Z853 Personal history of malignant neoplasm of breast: Secondary | ICD-10-CM

## 2013-11-12 DIAGNOSIS — F17200 Nicotine dependence, unspecified, uncomplicated: Secondary | ICD-10-CM

## 2013-11-12 DIAGNOSIS — R071 Chest pain on breathing: Secondary | ICD-10-CM
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
## 2013-11-13 ENCOUNTER — Encounter: Payer: Self-pay | Admitting: Family Medicine

## 2013-11-26 ENCOUNTER — Ambulatory Visit
Admission: RE | Admit: 2013-11-26 | Discharge: 2013-11-26 | Disposition: A | Discharge status: Home / Self Care | Payer: BC Managed Care – PPO | Source: Ambulatory Visit | Attending: Family Medicine | Admitting: Family Medicine

## 2013-11-26 DIAGNOSIS — E28319 Asymptomatic premature menopause: Secondary | ICD-10-CM

## 2013-11-29 ENCOUNTER — Encounter: Payer: Self-pay | Admitting: Family Medicine

## 2013-11-29 DIAGNOSIS — M810 Age-related osteoporosis without current pathological fracture: Secondary | ICD-10-CM | POA: Insufficient documentation

## 2013-12-06 ENCOUNTER — Encounter: Payer: Self-pay | Admitting: Family Medicine

## 2013-12-06 ENCOUNTER — Ambulatory Visit (INDEPENDENT_AMBULATORY_CARE_PROVIDER_SITE_OTHER): Payer: BC Managed Care – PPO | Admitting: Family Medicine

## 2013-12-06 VITALS — BP 138/84 | HR 68 | Temp 97.5°F | Wt 173.5 lb

## 2013-12-06 DIAGNOSIS — R238 Other skin changes: Secondary | ICD-10-CM | POA: Insufficient documentation

## 2013-12-06 DIAGNOSIS — R21 Rash and other nonspecific skin eruption: Secondary | ICD-10-CM

## 2013-12-06 DIAGNOSIS — M810 Age-related osteoporosis without current pathological fracture: Secondary | ICD-10-CM

## 2013-12-06 DIAGNOSIS — F17200 Nicotine dependence, unspecified, uncomplicated: Secondary | ICD-10-CM

## 2013-12-06 MED ORDER — ALENDRONATE SODIUM 70 MG PO TABS: 70.0000 mg | ORAL_TABLET | ORAL | Status: DC

## 2013-12-06 MED ORDER — DESONIDE 0.05 % EX CREA: TOPICAL_CREAM | Freq: Two times a day (BID) | CUTANEOUS | Status: DC

## 2013-12-06 NOTE — Assessment & Plan Note (Signed)
Continue to encourage cessation. Provided website for further resources. Did not tolerate wellbutrin in past. Has used patches in past. Consider chantix.

## 2013-12-06 NOTE — Assessment & Plan Note (Addendum)
Anticipate initial bath salt reaction with superimposed xerosis.  No evidence of infection today.  Treat with desonide cream and moisturizing cream, see pt instructions for further plan. Update if persistent rash for further evaluation.
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
## 2013-12-06 NOTE — Patient Instructions (Addendum)
Try to get most or all of your calcium from your food--aim for 1200. To figure out dietary calcium: 300 mg/day from all non dairy foods plus 300 mg per cup of milk, other dairy, or fortified juice. Non dairy foods that contain calcium: Kale, oranges, sardines, oatmeal, soy milk/soybeans, salmon, white beans, dried figs, turnip greens, almonds, broccoli, tofu. Increase dietary vitamin D intake.  That means more fish - mackerel, tuna, salmon, herring, sardines, catfish, cod liver oil, and eggs.  And small doses of sunshine throughout the day. I'd suggest starting calcium and vitamin D supplement (600mg /400units) once daily along with dietary changes. Start fosamax for osteoporosis. Keep working on quitting smoking - we may talk about medicine to help.  Look into www.quitline[place].org For skin - use topical steroid on legs and then moisturize with aveeno or eucerin cream daily.  Avoid hot showers.  May try oatmeal baths.  Osteoporosis Throughout your life, your body breaks down old bone and replaces it with new bone. As you get older, your body does not replace bone as quickly as it breaks it down. By the age of 6 years, most people begin to gradually lose bone because of the imbalance between bone loss and replacement. Some people lose more bone than others. Bone loss beyond a specified normal degree is considered osteoporosis.  Osteoporosis affects the strength and durability of your bones. The inside of the ends of your bones and your flat bones, like the bones of your pelvis, look like honeycomb, filled with tiny open spaces. As bone loss occurs, your bones become less dense. This means that the open spaces inside your bones become bigger and the walls between these spaces become thinner. This makes your bones weaker. Bones of a person with osteoporosis can become so weak that they can break (fracture) during minor accidents, such as a simple fall. CAUSES  The following factors have been associated  with the development of osteoporosis:  Smoking.  Drinking more than 2 alcoholic drinks several days per week.  Long-term use of certain medicines:  Corticosteroids.  Chemotherapy medicines.  Thyroid medicines.  Antiepileptic medicines.  Gonadal hormone suppression medicine.  Immunosuppression medicine.  Being underweight.  Lack of physical activity.  Lack of exposure to the sun. This can lead to vitamin D deficiency.  Certain medical conditions:  Certain inflammatory bowel diseases, such as Crohn disease and ulcerative colitis.  Diabetes.  Hyperthyroidism.  Hyperparathyroidism. RISK FACTORS Anyone can develop osteoporosis. However, the following factors can increase your risk of developing osteoporosis:  Gender Women are at higher risk than men.  Age Being older than 71 years increases your risk.  Ethnicity White and Asian people have an increased risk.  Weight Being extremely underweight can increase your risk of osteoporosis.  Family history of osteoporosis Having a family member who has developed osteoporosis can increase your risk. SYMPTOMS  Usually, people with osteoporosis have no symptoms.  DIAGNOSIS  Signs during a physical exam that may prompt your caregiver to suspect osteoporosis include:  Decreased height. This is usually caused by the compression of the bones that form your spine (vertebrae) because they have weakened and become fractured.  A curving or rounding of the upper back (kyphosis). To confirm signs of osteoporosis, your caregiver may request a procedure that uses 2 low-dose X-ray beams with different levels of energy to measure your bone mineral density (dual-energy X-ray absorptiometry [DXA]). Also, your caregiver may check your level of vitamin D. TREATMENT  The goal of osteoporosis treatment is to  strengthen bones in order to decrease the risk of bone fractures. There are different types of medicines available to help achieve this  goal. Some of these medicines work by slowing the processes of bone loss. Some medicines work by increasing bone density. Treatment also involves making sure that your levels of calcium and vitamin D are adequate. PREVENTION  There are things you can do to help prevent osteoporosis. Adequate intake of calcium and vitamin D can help you achieve optimal bone mineral density. Regular exercise can also help, especially resistance and weight-bearing activities. If you smoke, quitting smoking is an important part of osteoporosis prevention. MAKE SURE YOU:  Understand these instructions.  Will watch your condition.  Will get help right away if you are not doing well or get worse. FOR MORE INFORMATION www.osteo.org and EquipmentWeekly.com.ee Document Released: 07/31/2005 Document Revised: 02/15/2013 Document Reviewed: 10/05/2011 [facility] Patient Information 2014 [place], [place].

## 2013-12-06 NOTE — Assessment & Plan Note (Signed)
New dx - reviewed dx and management. Reviewed recommended daily amt of calcium and vitamin D. Check vit D next lab work. Reviewed treatments for osteoporosis - will avoid hormonal med 2/2 breast cancer history. Start bisphosphonate. Discussed risks common side effects and benefits of med including GI distress, atypical fracture, avoidance during dental work. See pt instructions for plan and provided osteoporosis patient handout.

## 2013-12-06 NOTE — Progress Notes (Signed)
Pre-visit discussion using our clinic review tool. No additional management support is needed unless otherwise documented below in the visit note.  

## 2013-12-08 ENCOUNTER — Telehealth: Payer: Self-pay | Admitting: Family Medicine

## 2013-12-08 NOTE — Telephone Encounter (Signed)
Relevant patient education assigned to patient using Emmi. ° °

## 2014-02-03 ENCOUNTER — Encounter: Payer: Self-pay | Admitting: Family Medicine

## 2014-02-03 ENCOUNTER — Ambulatory Visit (INDEPENDENT_AMBULATORY_CARE_PROVIDER_SITE_OTHER): Payer: BC Managed Care – PPO | Admitting: Family Medicine

## 2014-02-03 VITALS — BP 140/82 | HR 72 | Temp 97.5°F | Ht 64.0 in | Wt 174.0 lb

## 2014-02-03 DIAGNOSIS — R21 Rash and other nonspecific skin eruption: Secondary | ICD-10-CM

## 2014-02-03 MED ORDER — PREDNISONE 20 MG PO TABS: ORAL_TABLET | ORAL | Status: DC

## 2014-02-03 NOTE — Progress Notes (Signed)
   BP 140/82  Pulse 72  Temp(Src) 97.5 F (36.4 C) (Oral)  Ht 5\' 4"  (1.626 m)  Wt 174 lb (78.926 kg)  BMI 29.85 kg/m2  SpO2 97%   CC: worsening skin rash  Subjective:    Patient ID: Sabrina Wright, female    DOB: May 31, 1961, 53 y.o.   MRN: [id number]  HPI: RODERICA CATHELL is a 53 y.o. female presenting on 02/03/2014 for Rash   See prior note for details. From last office visit 12/06/2013: New skin rash on bilateral legs and lower back. Very itchy. Did use new bath salts recently - since then had reaction to bath salts (11/2013). Was spread throughout body but now better. Has residual dry patches on areas of skin. Has tried lotions (coco butter), baby oil.  No new foods, medicines, lotions, detergents, soaps, shampoos.  No other similar rashes at home. At that time, anticipated initial bath salt reaction with superimposed xerosis. No infection.  Treated with desonide cream and moisturizing cream.  Today: No more bath salt exposure.  Desonide cream did help significantly.  Ran out 2 wks ago and since rash has returned with a vengeance. Pruritic red rash on chest, abdomen, thighs and shins.  Some on arms. No similar rashes at home.  No systemic sxs.  Relevant past medical, surgical, family and social history reviewed and updated as indicated.  Allergies and medications reviewed and updated. Current Outpatient Prescriptions on File Prior to Visit  Medication Sig  . alendronate (FOSAMAX) 70 MG tablet Take 1 tablet (70 mg total) by mouth every 7 (seven) days. Take with a full glass of water on an empty stomach.  Marland Kitchen amitriptyline (ELAVIL) 50 MG tablet Take 2 tablets (100 mg total) by mouth at bedtime.  . Calcium Carb-Cholecalciferol (CALCIUM-VITAMIN D) 600-400 MG-UNIT TABS Take 1 tablet by mouth daily.  Marland Kitchen desonide (DESOWEN) 0.05 % cream Apply topically 2 (two) times daily. Apply to AA, no more than 2 wks at a time.  . Multiple Vitamins-Minerals (MULTIVITAMIN PO) Take 1 tablet by mouth daily.    No current facility-administered medications on file prior to visit.    Review of Systems Per HPI unless specifically indicated above    Objective:    BP 140/82  Pulse 72  Temp(Src) 97.5 F (36.4 C) (Oral)  Ht 5\' 4"  (1.626 m)  Wt 174 lb (78.926 kg)  BMI 29.85 kg/m2  SpO2 97%  Physical Exam  Nursing note and vitals reviewed. Constitutional: She appears well-developed and well-nourished. No distress.  Skin: Skin is warm and dry. Rash noted. There is erythema.  Erythematous pruritic rash on arms, chest and abdomen and anterior thighs, some annular areas Spares posterior legs and back and face/scalp.       Assessment & Plan:   Problem List Items Addressed This Visit   Rash and nonspecific skin eruption - Primary     Persistent rash for last 3 months.  Still looks like an irritant dermatitis but unclear etiology.  Not consistent with scabies. Will treat with prednisone and antihistamine course. Continue other measures for symptomatic relief. If return of rash, will refer to dermatology.        Follow up plan: No Follow-up on file.

## 2014-02-03 NOTE — Progress Notes (Signed)
Pre visit review using our clinic review tool, if applicable. No additional management support is needed unless otherwise documented below in the visit note. 

## 2014-02-03 NOTE — Assessment & Plan Note (Signed)
Persistent rash for last 3 months.  Still looks like an irritant dermatitis but unclear etiology.  Not consistent with scabies. Will treat with prednisone and antihistamine course. Continue other measures for symptomatic relief. If return of rash, will refer to dermatology.

## 2014-02-03 NOTE — Patient Instructions (Signed)
I'm not sure where this rash comes from but it seems consistent with an irritant dermatitis.   Treat with claritin during the day and benadryl at night for itch. Start prednisone course (sent to pharmacy) to calm down inflammation. If rash returns, call me for referral to dermatology. Good to see you today, call us with questions.

## 2014-03-09 ENCOUNTER — Encounter: Payer: Self-pay | Admitting: Family Medicine

## 2014-03-09 ENCOUNTER — Ambulatory Visit (INDEPENDENT_AMBULATORY_CARE_PROVIDER_SITE_OTHER): Payer: BC Managed Care – PPO | Admitting: Family Medicine

## 2014-03-09 ENCOUNTER — Ambulatory Visit (INDEPENDENT_AMBULATORY_CARE_PROVIDER_SITE_OTHER)
Admission: RE | Admit: 2014-03-09 | Discharge: 2014-03-09 | Disposition: A | Discharge status: Home / Self Care | Payer: BC Managed Care – PPO | Source: Ambulatory Visit | Attending: Family Medicine | Admitting: Family Medicine

## 2014-03-09 VITALS — BP 100/60 | HR 83 | Temp 97.9°F | Ht 64.0 in | Wt 172.5 lb

## 2014-03-09 DIAGNOSIS — R0781 Pleurodynia: Secondary | ICD-10-CM

## 2014-03-09 DIAGNOSIS — F17200 Nicotine dependence, unspecified, uncomplicated: Secondary | ICD-10-CM

## 2014-03-09 DIAGNOSIS — R079 Chest pain, unspecified: Secondary | ICD-10-CM

## 2014-03-09 MED ORDER — HYDROCODONE-ACETAMINOPHEN 5-325 MG PO TABS: 1.0000 | ORAL_TABLET | Freq: Four times a day (QID) | ORAL | Status: DC | PRN

## 2014-03-09 NOTE — Patient Instructions (Signed)
Use heat on painful area 10 minutes at a time  Take a deep breath every hour with a pillow to prevent pneumonia  xrays now - we will call you with a result  Use norco for pain with caution of sedation  Think about quitting smoking

## 2014-03-09 NOTE — Progress Notes (Signed)
Pre visit review using our clinic review tool, if applicable. No additional management support is needed unless otherwise documented below in the visit note. 
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
## 2014-03-10 NOTE — Assessment & Plan Note (Signed)
Disc in detail risks of smoking and possible outcomes including copd, vascular/ heart disease, cancer , respiratory and sinus infections  Pt voices understanding  cxr today

## 2014-03-10 NOTE — Assessment & Plan Note (Signed)
After trauma -see HPI Xray of chest and R ribs Reassuring exam  Suspect contusion or non displ rib fx  Hx of breast ca and smoking  norco for pain with caution  Disc deep breaths to prevent atelectasis

## 2014-05-02 ENCOUNTER — Telehealth: Payer: Self-pay

## 2014-05-02 DIAGNOSIS — R21 Rash and other nonspecific skin eruption: Secondary | ICD-10-CM

## 2014-05-02 NOTE — Telephone Encounter (Signed)
Pt left v/m pt has been seen x 2 for rash; pt was to cb if rash reappeared. Pt has rash again; 02/03/14 office note advised if rash again would do dermatology referral.Please advise.

## 2014-05-03 NOTE — Telephone Encounter (Signed)
Patient notified and will await call.

## 2014-05-03 NOTE — Telephone Encounter (Signed)
plz notify referral placed.

## 2014-05-05 ENCOUNTER — Other Ambulatory Visit: Payer: Self-pay | Admitting: Dermatology

## 2014-05-21 ENCOUNTER — Encounter: Payer: Self-pay | Admitting: Family Medicine

## 2014-09-16 ENCOUNTER — Other Ambulatory Visit: Payer: Self-pay

## 2014-09-16 DIAGNOSIS — Z1231 Encounter for screening mammogram for malignant neoplasm of breast: Secondary | ICD-10-CM

## 2014-11-11 ENCOUNTER — Encounter: Payer: Self-pay | Admitting: *Deleted

## 2014-11-11 ENCOUNTER — Ambulatory Visit
Admission: RE | Admit: 2014-11-11 | Discharge: 2014-11-11 | Disposition: A | Discharge status: Home / Self Care | Payer: BLUE CROSS/BLUE SHIELD | Source: Ambulatory Visit

## 2014-11-11 DIAGNOSIS — Z1231 Encounter for screening mammogram for malignant neoplasm of breast: Secondary | ICD-10-CM

## 2014-11-11 LAB — HM MAMMOGRAPHY: HM Mammogram: NORMAL

## 2014-12-08 ENCOUNTER — Other Ambulatory Visit: Payer: Self-pay | Admitting: *Deleted

## 2014-12-08 NOTE — Telephone Encounter (Signed)
Ok to refill 

## 2014-12-09 MED ORDER — AMITRIPTYLINE HCL 50 MG PO TABS: 100.0000 mg | ORAL_TABLET | Freq: Every day | ORAL | Status: DC

## 2015-02-01 ENCOUNTER — Other Ambulatory Visit: Payer: Self-pay | Admitting: Family Medicine

## 2015-02-01 DIAGNOSIS — Z13220 Encounter for screening for lipoid disorders: Secondary | ICD-10-CM

## 2015-02-01 DIAGNOSIS — M810 Age-related osteoporosis without current pathological fracture: Secondary | ICD-10-CM

## 2015-02-03 ENCOUNTER — Other Ambulatory Visit (INDEPENDENT_AMBULATORY_CARE_PROVIDER_SITE_OTHER): Payer: BLUE CROSS/BLUE SHIELD

## 2015-02-03 DIAGNOSIS — Z13220 Encounter for screening for lipoid disorders: Secondary | ICD-10-CM | POA: Diagnosis not present

## 2015-02-03 DIAGNOSIS — M810 Age-related osteoporosis without current pathological fracture: Secondary | ICD-10-CM | POA: Diagnosis not present

## 2015-02-03 LAB — BASIC METABOLIC PANEL
BUN: 13 mg/dL (ref 6–23)
CO2: 29 meq/L (ref 19–32)
Calcium: 9.5 mg/dL (ref 8.4–10.5)
Chloride: 105 mEq/L (ref 96–112)
Creatinine, Ser: 0.77 mg/dL (ref 0.40–1.20)
GFR: 83.03 mL/min (ref >60.00)
Glucose, Bld: 92 mg/dL (ref 70–99)
Potassium: 4.1 mEq/L (ref 3.5–5.1)
SODIUM: 137 meq/L (ref 135–145)

## 2015-02-03 LAB — LIPID PANEL
CHOLESTEROL: 174 mg/dL (ref 0–200)
HDL: 55.2 mg/dL (ref >39.00)
LDL Cholesterol: 104 mg/dL — ABNORMAL HIGH (ref 0–99)
NonHDL: 118.8
Total CHOL/HDL Ratio: 3
Triglycerides: 73 mg/dL (ref 0.0–149.0)
VLDL: 14.6 mg/dL (ref 0.0–40.0)

## 2015-02-03 LAB — TSH: TSH: 2.45 u[IU]/mL (ref 0.35–4.50)

## 2015-02-03 LAB — VITAMIN D 25 HYDROXY (VIT D DEFICIENCY, FRACTURES): VITD: 10.17 ng/mL — ABNORMAL LOW (ref 30.00–100.00)

## 2015-02-08 ENCOUNTER — Ambulatory Visit (INDEPENDENT_AMBULATORY_CARE_PROVIDER_SITE_OTHER): Payer: BLUE CROSS/BLUE SHIELD | Admitting: Family Medicine

## 2015-02-08 ENCOUNTER — Encounter: Payer: Self-pay | Admitting: *Deleted

## 2015-02-08 ENCOUNTER — Other Ambulatory Visit: Payer: Self-pay | Admitting: Family Medicine

## 2015-02-08 ENCOUNTER — Encounter: Payer: Self-pay | Admitting: Family Medicine

## 2015-02-08 ENCOUNTER — Other Ambulatory Visit (HOSPITAL_COMMUNITY)
Admission: RE | Admit: 2015-02-08 | Discharge: 2015-02-08 | Disposition: A | Discharge status: Home / Self Care | Payer: BLUE CROSS/BLUE SHIELD | Source: Ambulatory Visit | Attending: Family Medicine | Admitting: Family Medicine

## 2015-02-08 VITALS — BP 110/66 | HR 76 | Temp 98.0°F | Ht 64.0 in | Wt 184.0 lb

## 2015-02-08 DIAGNOSIS — Z113 Encounter for screening for infections with a predominantly sexual mode of transmission: Secondary | ICD-10-CM | POA: Insufficient documentation

## 2015-02-08 DIAGNOSIS — Z1211 Encounter for screening for malignant neoplasm of colon: Secondary | ICD-10-CM

## 2015-02-08 DIAGNOSIS — F17200 Nicotine dependence, unspecified, uncomplicated: Secondary | ICD-10-CM

## 2015-02-08 DIAGNOSIS — Z853 Personal history of malignant neoplasm of breast: Secondary | ICD-10-CM

## 2015-02-08 DIAGNOSIS — E28319 Asymptomatic premature menopause: Secondary | ICD-10-CM

## 2015-02-08 DIAGNOSIS — Z01419 Encounter for gynecological examination (general) (routine) without abnormal findings: Secondary | ICD-10-CM | POA: Diagnosis not present

## 2015-02-08 DIAGNOSIS — M940 Chondrocostal junction syndrome [Tietze]: Secondary | ICD-10-CM | POA: Diagnosis not present

## 2015-02-08 DIAGNOSIS — Z124 Encounter for screening for malignant neoplasm of cervix: Secondary | ICD-10-CM

## 2015-02-08 DIAGNOSIS — N950 Postmenopausal bleeding: Secondary | ICD-10-CM

## 2015-02-08 DIAGNOSIS — Z0000 Encounter for general adult medical examination without abnormal findings: Secondary | ICD-10-CM

## 2015-02-08 DIAGNOSIS — M810 Age-related osteoporosis without current pathological fracture: Secondary | ICD-10-CM

## 2015-02-08 DIAGNOSIS — Z23 Encounter for immunization: Secondary | ICD-10-CM

## 2015-02-08 DIAGNOSIS — Z720 Tobacco use: Secondary | ICD-10-CM

## 2015-02-08 NOTE — Addendum Note (Signed)
Addended by: Royann Shivers A on: 02/08/2015 12:15 PM   Modules accepted: Orders

## 2015-02-08 NOTE — Assessment & Plan Note (Addendum)
Pap today. Exam with some discharge.  Schedule pelvic US and refer to GYN. Pt agrees. Will also send CT/GC and wet prep with pap.

## 2015-02-08 NOTE — Assessment & Plan Note (Signed)
Preventative protocols reviewed and updated unless pt declined. Discussed healthy diet and lifestyle.  

## 2015-02-08 NOTE — Assessment & Plan Note (Signed)
UTD mammogram. Check breast exam today. Released from onc care (breast cancer 2003)

## 2015-02-08 NOTE — Assessment & Plan Note (Signed)
Continue to encourage smoking cessation. Contemplative.  °

## 2015-02-08 NOTE — Assessment & Plan Note (Signed)
Reassured. Discussed tylenol/nsaid use and heating pad

## 2015-02-08 NOTE — Patient Instructions (Addendum)
Tdap today, pneumovax today. Start vitamin D 2000 units daily. Pap smear and pelvic exam and breast exam today. We will schedule pelvic ultrasound and refer you to [facility] by lab to pick up stool kit. Good to see you today, call us with questions Return as needed or in 1yr for next physical. 

## 2015-02-08 NOTE — Progress Notes (Signed)
Pre visit review using our clinic review tool, if applicable. No additional management support is needed unless otherwise documented below in the visit note. 

## 2015-02-08 NOTE — Progress Notes (Signed)
BP 110/66 mmHg  Pulse 76  Temp(Src) 98 F (36.7 C) (Oral)  Ht 5\' 4"  (1.626 m)  Wt 184 lb (83.462 kg)  BMI 31.57 kg/m2   CC: CPE  Subjective:    Patient ID: Sabrina Wright, female    DOB: 12/04/1960, 54 y.o.   MRN: [id number]  HPI: Sabrina Wright is a 54 y.o. female presenting on 02/08/2015 for Annual Exam   LMP - 2005 - early due to chemo from breast cancer. Noticed vaginal spotting 3 weeks ago, lasted 3 days associated with cramping. Not associated with sex.  Osteoporosis - has trouble taking weekly fosamax. Only taking 1 calcium tablet daily. Tries to get ice cream and yogurt in diet or cheese. No milk.   Smoking - 3/4 ppd.   Preventative: Colon cancer screening - never returned prior stool kit - forgot. Requests new stool kit. Mammograms with onc given personal hx 2003. Latest normal 11/2014. Well woman exam 2014 - normal. h/o abnl paps in past. LMP 2005 - early menopause from chemo. Tdap today Flu shot - allergic to eggs so does not receive. Seat belt use discussed Sunscreen use discussed. No changing moles on skin.  Lives with husband and 2 daughters, 2 dogs and 2 cats  Occupation: Animal nutritionist at [employer]  Edu: HS  Activity: walks daily Diet: good water, fruits/vegetables daily, red meat 2x/wk, fish rarely.  Relevant past medical, surgical, family and social history reviewed and updated as indicated. Interim medical history since our last visit reviewed. Allergies and medications reviewed and updated. Current Outpatient Prescriptions on File Prior to Visit  Medication Sig  . amitriptyline (ELAVIL) 50 MG tablet Take 2 tablets (100 mg total) by mouth at bedtime.  . Calcium Carb-Cholecalciferol (CALCIUM-VITAMIN D) 600-400 MG-UNIT TABS Take 1 tablet by mouth daily.  . Multiple Vitamins-Minerals (MULTIVITAMIN PO) Take 1 tablet by mouth daily.  Marland Kitchen alendronate (FOSAMAX) 70 MG tablet Take 1 tablet (70 mg total) by mouth every 7 (seven) days. Take with a full  glass of water on an empty stomach. (Patient not taking: Reported on 02/08/2015)  . HYDROcodone-acetaminophen (NORCO) 5-325 MG per tablet Take 1 tablet by mouth every 6 (six) hours as needed for moderate pain. (Patient not taking: Reported on 02/08/2015)   No current facility-administered medications on file prior to visit.    Review of Systems  Constitutional: Negative for fever, chills, activity change, appetite change, fatigue and unexpected weight change.  HENT: Negative for hearing loss.   Eyes: Negative for visual disturbance.  Respiratory: Negative for cough, chest tightness, shortness of breath and wheezing.   Cardiovascular: Positive for chest pain (with lifting heavy things at work). Negative for palpitations and leg swelling.  Gastrointestinal: Negative for nausea, vomiting, abdominal pain, diarrhea, constipation, blood in stool and abdominal distention.  Genitourinary: Negative for hematuria and difficulty urinating.  Musculoskeletal: Negative for myalgias, arthralgias and neck pain.  Skin: Negative for rash.  Neurological: Positive for headaches. Negative for dizziness, seizures and syncope.  Hematological: Negative for adenopathy. Does not bruise/bleed easily.  Psychiatric/Behavioral: Negative for dysphoric mood. The patient is not nervous/anxious.    Per HPI unless specifically indicated above     Objective:    BP 110/66 mmHg  Pulse 76  Temp(Src) 98 F (36.7 C) (Oral)  Ht 5\' 4"  (1.626 m)  Wt 184 lb (83.462 kg)  BMI 31.57 kg/m2  Wt Readings from Last 3 Encounters:  02/08/15 184 lb (83.462 kg)  03/09/14 172 lb 8 oz (78.245  kg)  02/03/14 174 lb (78.926 kg)    Physical Exam  Constitutional: She is oriented to person, place, and time. She appears well-developed and well-nourished. No distress.  HENT:  Head: Normocephalic and atraumatic.  Right Ear: Hearing, tympanic membrane, external ear and ear canal normal.  Left Ear: Hearing, tympanic membrane, external ear and ear  canal normal.  Nose: Nose normal.  Mouth/Throat: Uvula is midline, oropharynx is clear and moist and mucous membranes are normal. No oropharyngeal exudate, posterior oropharyngeal edema or posterior oropharyngeal erythema.  Eyes: Conjunctivae and EOM are normal. Pupils are equal, round, and reactive to light. No scleral icterus.  Neck: Normal range of motion. Neck supple. No thyromegaly present.  Cardiovascular: Normal rate, regular rhythm, normal heart sounds and intact distal pulses.   No murmur heard. Pulses:      Radial pulses are 2+ on the right side, and 2+ on the left side.  Pulmonary/Chest: Effort normal and breath sounds normal. No respiratory distress. She has no wheezes. She has no rales. She exhibits tenderness (L 2nd intercostal tenderness). Right breast exhibits no inverted nipple, no mass, no nipple discharge, no skin change and no tenderness. Left breast exhibits no inverted nipple, no mass, no nipple discharge, no skin change and no tenderness.  Abdominal: Soft. Bowel sounds are normal. She exhibits no distension and no mass. There is no tenderness. There is no rebound and no guarding.  Genitourinary: Uterus normal. Pelvic exam was performed with patient supine. There is no rash, tenderness, lesion or injury on the right labia. There is no rash, tenderness, lesion or injury on the left labia. Cervix exhibits no motion tenderness, no discharge and no friability. Right adnexum displays no mass, no tenderness and no fullness. Left adnexum displays no mass, no tenderness and no fullness. Vaginal discharge (thick white) found.  Pap performed on cervix  Musculoskeletal: Normal range of motion. She exhibits no edema.  Lymphadenopathy:       Head (right side): No submental, no submandibular, no tonsillar, no preauricular and no posterior auricular adenopathy present.       Head (left side): No submental, no submandibular, no tonsillar, no preauricular and no posterior auricular adenopathy  present.    She has no cervical adenopathy.    She has axillary adenopathy.       Right axillary: No lateral adenopathy present.       Left axillary: Lateral (<1cm, tender) adenopathy present.       Right: No supraclavicular adenopathy present.       Left: No supraclavicular adenopathy present.  Neurological: She is alert and oriented to person, place, and time.  CN grossly intact, station and gait intact  Skin: Skin is warm and dry. No rash noted.  Psychiatric: She has a normal mood and affect. Her behavior is normal. Judgment and thought content normal.  Nursing note and vitals reviewed.  Results for orders placed or performed in visit on 02/03/15  Lipid panel  Result Value Ref Range   Cholesterol 174 0 - 200 mg/dL   Triglycerides 73.0 0.0 - 149.0 mg/dL   HDL 55.20 >39.00 mg/dL   VLDL 14.6 0.0 - 40.0 mg/dL   LDL Cholesterol 104 (H) 0 - 99 mg/dL   Total CHOL/HDL Ratio 3    NonHDL 118.80   TSH  Result Value Ref Range   TSH 2.45 0.35 - 4.50 uIU/mL  Basic metabolic panel  Result Value Ref Range   Sodium 137 135 - 145 mEq/L   Potassium 4.1 3.5 -  5.1 mEq/L   Chloride 105 96 - 112 mEq/L   CO2 29 19 - 32 mEq/L   Glucose, Bld 92 70 - 99 mg/dL   BUN 13 6 - 23 mg/dL   Creatinine, Ser 0.77 0.40 - 1.20 mg/dL   Calcium 9.5 8.4 - 10.5 mg/dL   GFR 83.03 >60.00 mL/min  Vit D  25 hydroxy (rtn osteoporosis monitoring)  Result Value Ref Range   VITD 10.17 (L) 30.00 - 100.00 ng/mL      Assessment & Plan:   Problem List Items Addressed This Visit    Smoker    Continue to encourage smoking cessation. Contemplative.      Postmenopausal bleeding    Pap today. Exam with some discharge.  Schedule pelvic US and refer to GYN. Pt agrees. Will also send CT/GC and wet prep with pap.      Relevant Orders   US Pelvis Complete   US Transvaginal Non-OB   Ambulatory referral to Gynecology   Osteoporosis    Trouble remembering to take bisphosphonate weekly. Discussed options. Pt will try to  place sticker on calendar one day a week. Vit D low - start 2000 IU daily. Last dexa 11/2013.      Relevant Medications   Cholecalciferol (VITAMIN D) 2000 UNITS CAPS   History of left breast cancer    UTD mammogram. Check breast exam today. Released from onc care (breast cancer 2003)      Health maintenance examination - Primary    Preventative protocols reviewed and updated unless pt declined. Discussed healthy diet and lifestyle.       Early menopause   Relevant Orders   Ambulatory referral to Gynecology   Costochondritis    Reassured. Discussed tylenol/nsaid use and heating pad       Other Visit Diagnoses    Special screening for malignant neoplasms, colon        Relevant Orders    Fecal occult blood, imunochemical        Follow up plan: Return in about 1 year (around 02/08/2016), or as needed, for annual exam, prior fasting for blood work.

## 2015-02-08 NOTE — Assessment & Plan Note (Signed)
Trouble remembering to take bisphosphonate weekly. Discussed options. Pt will try to place sticker on calendar one day a week. Vit D low - start 2000 IU daily. Last dexa 11/2013.

## 2015-02-09 ENCOUNTER — Ambulatory Visit
Admit: 2015-02-09 | Disposition: A | Discharge status: Home / Self Care | Payer: Self-pay | Attending: Family Medicine | Admitting: Family Medicine

## 2015-02-09 LAB — CYTOLOGY - PAP

## 2015-02-10 ENCOUNTER — Encounter: Payer: Self-pay | Admitting: Family Medicine

## 2015-02-10 ENCOUNTER — Encounter: Payer: Self-pay | Admitting: *Deleted

## 2015-02-10 LAB — CERVICOVAGINAL ANCILLARY ONLY: Candida vaginitis: NEGATIVE

## 2015-02-15 ENCOUNTER — Ambulatory Visit (INDEPENDENT_AMBULATORY_CARE_PROVIDER_SITE_OTHER): Payer: BLUE CROSS/BLUE SHIELD | Admitting: Obstetrics & Gynecology

## 2015-02-15 ENCOUNTER — Encounter: Payer: Self-pay | Admitting: Obstetrics & Gynecology

## 2015-02-15 VITALS — BP 111/66 | HR 89 | Ht 64.0 in | Wt 184.0 lb

## 2015-02-15 DIAGNOSIS — R9389 Abnormal findings on diagnostic imaging of other specified body structures: Secondary | ICD-10-CM

## 2015-02-15 DIAGNOSIS — N950 Postmenopausal bleeding: Secondary | ICD-10-CM | POA: Diagnosis not present

## 2015-02-15 DIAGNOSIS — Z853 Personal history of malignant neoplasm of breast: Secondary | ICD-10-CM | POA: Diagnosis not present

## 2015-02-15 DIAGNOSIS — R938 Abnormal findings on diagnostic imaging of other specified body structures: Secondary | ICD-10-CM

## 2015-02-15 NOTE — Patient Instructions (Signed)
Postmenopausal Bleeding  Postmenopausal bleeding is any bleeding a woman has after she has entered into menopause. Menopause is the end of a woman's fertile years. After menopause, a woman no longer ovulates or has menstrual periods.   Postmenopausal bleeding can be caused by various things. Any type of postmenopausal bleeding, even if it appears to be a typical menstrual period, is concerning. This should be evaluated by your health care provider. Any treatment will depend on the cause of the bleeding.  HOME CARE INSTRUCTIONS  Monitor your condition for any changes. The following actions may help to alleviate any discomfort you are experiencing:  · Avoid the use of tampons and douches as directed by your health care provider.   · Change your pads frequently.  · Get regular pelvic exams and Pap tests.  · Keep all follow-up appointments for diagnostic tests as directed by your health care provider.  SEEK MEDICAL CARE IF:   · Your bleeding lasts more than 1 week.  · You have abdominal pain.  · You have bleeding with sexual intercourse.  SEEK IMMEDIATE MEDICAL CARE IF:   · You have a fever, chills, headache, dizziness, muscle aches, and bleeding.  · You have severe pain with bleeding.  · You are passing blood clots.  · You have bleeding and need more than 1 pad an hour.  · You feel faint.  MAKE SURE YOU:  · Understand these instructions.  · Will watch your condition.  · Will get help right away if you are not doing well or get worse.  Document Released: 01/29/2006 Document Revised: 08/11/2013 Document Reviewed: 05/20/2013  ExitCare® Patient Information ©2015 [employer]. This information is not intended to replace advice given to you by your health care provider. Make sure you discuss any questions you have with your health care provider.

## 2015-02-15 NOTE — Progress Notes (Signed)
Patient had an episode of bleeding about one month ago, just a small amount lasted about 3 days, experienced cramps as well.  She has not had a cycle for about 10 years.  Last pap smear done last week at [facility].

## 2015-02-15 NOTE — Addendum Note (Signed)
Addended by: Verita Schneiders A on: 02/15/2015 11:25 AM   Modules accepted: Level of Service

## 2015-02-15 NOTE — Progress Notes (Signed)
   CLINIC ENCOUNTER NOTE  History:  54 y.o. PMP woman with history of hormone positive left breast cancer here for evaluation of postmenopausal bleeding. Occurred last month, lasted 3 days with some cramping.  No other associated symptoms.  Seen by her PCP at [facility], did pap smear which was normal. Of note, patient has been menopausal since 2005 secondary to chemotherapy received after left breast lumpectomy, did not receive any Tamoxifen or other therapy.  The following portions of the patient's history were reviewed and updated as appropriate: allergies, current medications, past family history, past medical history, past social history, past surgical history and problem list. Normal pap on 02/08/15.  Normal mammogram on 11/09/14.   Review of Systems:  Pertinent items are noted in HPI. Comprehensive review of systems was otherwise negative.  Objective:  Physical Exam BP 111/66 mmHg  Pulse 89  Ht 5\' 4"  (1.626 m)  Wt 184 lb (83.462 kg)  BMI 31.57 kg/m2  LMP 02/14/2005 (Approximate) Constitutional: Well-developed, well-nourished female in no acute distress.  Head: Normocephalic, atraumatic,  Eyes: Conjunctivae and EOM are normal. Pupils are equal, round, and reactive to light. No scleral icterus.  Neck: Normal range of motion, supple, no masses Neurologic: Alert and oriented to person, place, and time.  Psychiatric: She has a normal mood and affect. Her behavior is normal. Judgment and thought content normal.  Cardiovascular: Normal heart rate noted, regular rhythm Respiratory: No problems with respiration noted, normal respiratory effort Abdomen: Soft, nontender and nondistended Pelvic: Normal appearing external genitalia; normal appearing vaginal mucosa and cervix with mild atrophy.  Normal appearing discharge. Normal uterine size, no other palpable masses, no uterine or adnexal tenderness  Labs and Imaging 02/09/15 Pelvic ultrasound at [facility]  7 week sized uterus, 15 mm heterogeonous  endometrial stripe, normal adnexa. No other anomalies  ENDOMETRIAL BIOPSY     The indications for endometrial biopsy were reviewed.   Risks of the biopsy including cramping, bleeding, infection, uterine perforation, inadequate specimen and need for additional procedures  were discussed. The patient states she understands and agrees to undergo procedure today. Consent was signed. Time out was performed. Urine HCG was negative. During the pelvic exam, the cervix was prepped with Betadine. A single-toothed tenaculum was placed on the anterior lip of the cervix to stabilize it. The 3 mm pipelle was introduced into the endometrial cavity without difficulty to a depth of 7cm, and a small amount of mucus-rich tissue was obtained after two passes and sent to pathology. The instruments were removed from the patient's vagina. Minimal bleeding from the cervix was noted. The patient tolerated the procedure well. Routine post-procedure instructions were given to the patient.    Assessment & Plan:  Had an in-depth discussion about etiologies of postmenopausal bleeding, concern about precancerous/hyperplasia or cancerous etiology. Also discussed role of unopposed estrogen exposure in leading to thickened or proliferative endometrium; and its possible correlation with endometrial hyperplasia/carcinoma.  Discussed that obesity is linked to endometrial pathology given that adipose cells produce extra estrogen (estrone) which can cause the endometrium to have a high amount of estrogen exposure. Will follow up results of endometrial biopsy and manage accordingly. Bleeding precautions reviewed Routine preventative health maintenance measures emphasized.   Total face-to-face time with patient: 30 minutes. Over 50% of encounter was spent on counseling and coordination of care.   Verita Schneiders, MD, [place] Att[facility] [place] for [employer], [place]

## 2015-02-21 NOTE — Discharge Summary (Signed)
PATIENT NAME:  Sabrina Wright, Sabrina Wright MR#:  [id number] DATE OF BIRTH:  07-11-1961  DATE OF ADMISSION:  06/09/2012 DATE OF DISCHARGE:  06/11/2012  ADMISSION DIAGNOSIS: Chest pain.   DISCHARGE DIAGNOSES:  1. Chest pain, noncardiac in origin, status post stress test which was abnormal. Had a cardiac cath which just showed minor irregularities.  2. Nicotine addiction. The patient was counseled regarding stopping of smoking.   CONSULTANT: Dr. Rockey Situ   PERTINENT LABS AND EVALUATIONS: Admitting glucose 106, BUN 11, creatinine 0.81, sodium 141, potassium 3.7, chloride 107, CO2 23, calcium 9.0. LFTs were normal. Troponin was less than 0.02 x3. CK-MB 2.2, 1.6, and 1.4.   EKG showed normal sinus rhythm with no ST-T wave changes. WBC 9.7, hemoglobin 13.5, platelet count 302. INR 0.9.   Stress MIBI showed anterior wall abnormality concerning for possible ischemia.   HOSPITAL COURSE: Cardiac cath was done by Dr. Rockey Situ showed minor irregularities and no other significant abnormalities. The patient was asymptomatic, is doing well, and is stable for discharge. Her pain was likely noncardiac in origin. It has since resolved.   DISCHARGE MEDICATIONS:  1. Multivitamins daily.  2. Tylenol 650 mg q.4 p.r.n. pain.   DISCHARGE INSTRUCTIONS: The patient is instructed to stop smoking.   DIET: Regular.   ACTIVITY: As tolerated.   TIMEFRAME FOR FOLLOW-UP: Follow-up with primary MD in 1 to 2 weeks. If no MD then can go to the [facility].   TIME SPENT: 32 minutes.   ____________________________ Lafonda Mosses Posey Pronto, MD shp:drc D: 06/11/2012 13:45:31 ET T: 06/12/2012 10:23:21 ET JOB#: [id number]  cc: Aubrielle Stroud H. Posey Pronto, MD, <Dictator> Alric Seton MD ELECTRONICALLY SIGNED 06/13/2012 10:50

## 2015-02-21 NOTE — H&P (Signed)
PATIENT NAME:  Sabrina Wright, Sabrina Wright MR#:  [id number] DATE OF BIRTH:  [birth year]  DATE OF ADMISSION:  06/09/2012  PRIMARY CARE PHYSICIAN: Previously Dr. Miguel Aschoff but she has not seen anybody recently because of insurance issues.   ER PHYSICIAN: Conni Slipper, MD  CHIEF COMPLAINT: Chest pain.   HISTORY OF PRESENT ILLNESS: The patient is a 54 year old female with no past medical history who came in because of chest pain that started around 3:30 this morning. The patient's thought she pulled a muscle and then she slept. After that she woke up and went to work and she drank a bottle of water. Then after that she felt very severe chest heaviness on the left side that radiated to the back. She also felt some nausea and also sweating at that time. She took some water and then came to the emergency room.  In the ER, she did receive some nitroglycerin. She says that helped her a lot. She mainly feels tightness in the chest at this time. No cough and no fever. Also, she has trouble breathing at this time.  PAST MEDICAL HISTORY: History of breast cancer status post lumpectomy and lymph node dissection, in 2003. She received some chemotherapy and radiation.   DRUG ALLERGIES: Codeine.   SOCIAL HISTORY: She smokes about 3/4 pack per day. No alcohol. She works at a [employer].   FAMILY HISTORY: Significant for father who had a heart attack and died at the age of 19. Parents also had history of diabetes.   MEDICATIONS: She does not take any medications on a regular basis.   PAST SURGICAL HISTORY: Lumpectomy for breast cancer.   REVIEW OF SYSTEMS: CONSTITUTIONAL: She feels okay at this time. Denies any fever or fatigue. EYES: No blurred vision. No glaucoma. RESPIRATORY: The patient has slight cough but no trouble breathing. CARDIOVASCULAR: Chest pain and some nausea this morning. She had no syncope, no palpitations. GASTROINTESTINAL: No nausea. No vomiting. No abdominal pain. No constipation. No diarrhea.  No change in bowel habits. GU: No dysuria. ENDOCRINE: No polyuria or nocturia. MUSCULOSKELETAL: No joint pain. NEUROLOGIC: No numbness or weakness. PSYCH: No anxiety or insomnia.   PHYSICAL EXAMINATION:   VITAL SIGNS: Temperature 98, pulse 70, respirations 18, blood pressure 116/59, and saturations 98% on room air.   GENERAL: Alert, awake, and oriented, answering questions appropriately.   HEAD/EYES: Head atraumatic, normocephalic. Pupils equally reacting to light. Extraocular movements are intact.   ENT: No tympanic membrane congestion. No turbinate hypertrophy. No oropharyngeal erythema.   NECK: Normal range of motion. No JVD. No carotid bruits. No lymphadenopathy.   CARDIOVASCULAR: S1 and S2 regular. PMI not displaced.   LUNGS: Clear to auscultation. No wheeze. No rales.   ABDOMEN: Soft, nontender, and nondistended. Bowel sounds present.   EXTREMITIES: No extremity edema. No cyanosis. No clubbing.   NEUROLOGIC: The patient is alert, awake, and oriented. Cranial nerves II through XII are intact. Power is 5/5 in upper and lower extremities. Sensation is intact. Deep tendon reflexes are 2+ bilaterally.   PSYCH: Mood and affect are within normal limits.  LABORATORY, DIAGNOSTIC AND RADIOLOGIC DATA: EKG showed normal sinus rhythm. No ST-T changes. Rate is 73 beats per minute.   Chest x-ray: No acute cardiopulmonary disease.   WBC 9.7, hemoglobin 13.5, hematocrit 40.5, and platelets 302.   Electrolytes: Sodium 141, potassium 3.7, chloride 107, bicarbonate 23, BUN 11, creatinine 0.81, and glucose 106. Troponin less than 0.02. CK total is elevated to 51 and CPK-MB 2.2.  ASSESSMENT AND PLAN: This is a 54 year old female patient with chest pain relieved with nitroglycerin. Symptoms are concerning for angina. The patient is admitted to the hospitalist service on telemetry. Continue to cycle cardiac enzymes, two more sets, and get stress test tomorrow morning. Continue her on aspirin and  nitroglycerin and also a small dose of beta blocker and Lovenox. Obtain a lipid panel in the morning. The patient has history of smoking. I counseled the patient for 3 minutes. She says she does not want to quit at this time.   TIME SPENT ON ADMISSION: Approximately 55 minutes.  ____________________________ Epifanio Lesches, MD sk:slb D: 06/09/2012 15:54:50 ET T: 06/09/2012 16:15:35 ET JOB#: [id number]  cc: Epifanio Lesches, MD, <Dictator> Richard L. Rosanna Randy, MD Epifanio Lesches MD ELECTRONICALLY SIGNED 06/19/2012 13:18

## 2015-03-02 ENCOUNTER — Ambulatory Visit (INDEPENDENT_AMBULATORY_CARE_PROVIDER_SITE_OTHER): Payer: BLUE CROSS/BLUE SHIELD | Admitting: Obstetrics & Gynecology

## 2015-03-02 ENCOUNTER — Encounter: Payer: Self-pay | Admitting: Obstetrics & Gynecology

## 2015-03-02 VITALS — BP 126/80 | HR 76 | Resp 16 | Ht 64.0 in | Wt 185.0 lb

## 2015-03-02 DIAGNOSIS — N950 Postmenopausal bleeding: Secondary | ICD-10-CM | POA: Diagnosis not present

## 2015-03-02 NOTE — Patient Instructions (Signed)
Return to clinic for any scheduled appointments or for any gynecologic concerns as needed.   
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
## 2015-03-21 ENCOUNTER — Encounter: Payer: Self-pay | Admitting: Family Medicine

## 2015-03-21 ENCOUNTER — Ambulatory Visit (INDEPENDENT_AMBULATORY_CARE_PROVIDER_SITE_OTHER): Payer: BLUE CROSS/BLUE SHIELD | Admitting: Family Medicine

## 2015-03-21 VITALS — BP 100/70 | HR 97 | Temp 98.3°F | Wt 177.0 lb

## 2015-03-21 DIAGNOSIS — R21 Rash and other nonspecific skin eruption: Secondary | ICD-10-CM | POA: Diagnosis not present

## 2015-03-21 MED ORDER — CLOBETASOL PROPIONATE 0.05 % EX CREA: 1.0000 "application " | TOPICAL_CREAM | Freq: Two times a day (BID) | CUTANEOUS | Status: DC

## 2015-03-21 NOTE — Patient Instructions (Signed)
This doesn't look like shingles. I wonder about bug bite. Treat with steroid cream sent to pharmacy today - should help calm itch. Continue benadryl. Let us know if spreading or not improving with treatment.

## 2015-03-21 NOTE — Progress Notes (Signed)
   BP 100/70 mmHg  Pulse 97  Temp(Src) 98.3 F (36.8 C)  Wt 177 lb (80.287 kg)  SpO2 98%  LMP 02/14/2005 (Approximate)   CC: ?shingles  Subjective:    Patient ID: Sabrina Wright, female    DOB: 1961/04/14, 54 y.o.   MRN: [id number]  HPI: Sabrina Wright is a 54 y.o. female presenting on 03/21/2015 for Acute Visit   Rash noticed 4d ago on L lateral abdomen that has been very itchy. Has bruised skin from itching. Tried neosporin and coco butter cream without significant improvement.  No new lotions, detergents, soaps or shampoo. No new foods, no new medications.  H/o recurrent shingles last 2013 usually L buttock. This feels similar.   Relevant past medical, surgical, family and social history reviewed and updated as indicated. Interim medical history since our last visit reviewed. Allergies and medications reviewed and updated. Current Outpatient Prescriptions on File Prior to Visit  Medication Sig  . alendronate (FOSAMAX) 70 MG tablet TAKE 1 TABLET BY MOUTH 30 MINUTES BEFOREBREAKFAST ONCE A WEEK WITH ATLEAST 8 OZ. OF WATER.  Marland Kitchen amitriptyline (ELAVIL) 50 MG tablet Take 2 tablets (100 mg total) by mouth at bedtime.  . Calcium Carb-Cholecalciferol (CALCIUM-VITAMIN D) 600-400 MG-UNIT TABS Take 1 tablet by mouth daily.  . Cholecalciferol (VITAMIN D) 2000 UNITS CAPS Take 1 capsule by mouth daily.  Marland Kitchen HYDROcodone-acetaminophen (NORCO) 5-325 MG per tablet Take 1 tablet by mouth every 6 (six) hours as needed for moderate pain.  . Multiple Vitamins-Minerals (MULTIVITAMIN PO) Take 1 tablet by mouth daily.   No current facility-administered medications on file prior to visit.    Review of Systems Per HPI unless specifically indicated above     Objective:    BP 100/70 mmHg  Pulse 97  Temp(Src) 98.3 F (36.8 C)  Wt 177 lb (80.287 kg)  SpO2 98%  LMP 02/14/2005 (Approximate)  Wt Readings from Last 3 Encounters:  03/21/15 177 lb (80.287 kg)  03/02/15 185 lb (83.915 kg)  02/15/15 184 lb  (83.462 kg)    Physical Exam  Constitutional: She appears well-developed and well-nourished. No distress.  Skin: Skin is warm and dry. Rash noted. There is erythema.  Papular rash with surrounding erythema L lateral abdominal wall - superior to patch bruising noted as well  Nursing note and vitals reviewed.     Assessment & Plan:   Problem List Items Addressed This Visit    Skin rash - Primary    Left lateral abdomen rash not consistent with shingles (nonvesicular). Anticipate bug bites with surrounding local skin reaction and bruising from scratching superior to this - treat with clobetasol cream x 1-2 wks and benadryl for itch. Update if not improving with treatment.          Follow up plan: Return if symptoms worsen or fail to improve.

## 2015-03-21 NOTE — Assessment & Plan Note (Addendum)
Left lateral abdomen rash not consistent with shingles (nonvesicular). Anticipate bug bites with surrounding local skin reaction and bruising from scratching superior to this - treat with clobetasol cream x 1-2 wks and benadryl for itch. Update if not improving with treatment.

## 2015-03-21 NOTE — Progress Notes (Signed)
Pre visit review using our clinic review tool, if applicable. No additional management support is needed unless otherwise documented below in the visit note. 

## 2015-05-31 ENCOUNTER — Telehealth: Payer: Self-pay | Admitting: *Deleted

## 2015-06-01 NOTE — Telephone Encounter (Signed)
error 

## 2015-08-05 ENCOUNTER — Emergency Department (HOSPITAL_COMMUNITY): Payer: BLUE CROSS/BLUE SHIELD

## 2015-08-05 ENCOUNTER — Emergency Department (HOSPITAL_COMMUNITY)
Admission: EM | Admit: 2015-08-05 | Discharge: 2015-08-05 | Disposition: A | Discharge status: Home / Self Care | Payer: BLUE CROSS/BLUE SHIELD | Attending: Physician Assistant | Admitting: Physician Assistant

## 2015-08-05 ENCOUNTER — Encounter (HOSPITAL_COMMUNITY): Payer: Self-pay | Admitting: *Deleted

## 2015-08-05 DIAGNOSIS — R079 Chest pain, unspecified: Secondary | ICD-10-CM | POA: Diagnosis not present

## 2015-08-05 DIAGNOSIS — R110 Nausea: Secondary | ICD-10-CM | POA: Insufficient documentation

## 2015-08-05 DIAGNOSIS — M810 Age-related osteoporosis without current pathological fracture: Secondary | ICD-10-CM | POA: Diagnosis not present

## 2015-08-05 DIAGNOSIS — Z862 Personal history of diseases of the blood and blood-forming organs and certain disorders involving the immune mechanism: Secondary | ICD-10-CM | POA: Diagnosis not present

## 2015-08-05 DIAGNOSIS — Z853 Personal history of malignant neoplasm of breast: Secondary | ICD-10-CM | POA: Insufficient documentation

## 2015-08-05 DIAGNOSIS — G43909 Migraine, unspecified, not intractable, without status migrainosus: Secondary | ICD-10-CM | POA: Insufficient documentation

## 2015-08-05 DIAGNOSIS — Z79899 Other long term (current) drug therapy: Secondary | ICD-10-CM | POA: Diagnosis not present

## 2015-08-05 DIAGNOSIS — R05 Cough: Secondary | ICD-10-CM | POA: Diagnosis not present

## 2015-08-05 DIAGNOSIS — F329 Major depressive disorder, single episode, unspecified: Secondary | ICD-10-CM | POA: Diagnosis not present

## 2015-08-05 DIAGNOSIS — Z720 Tobacco use: Secondary | ICD-10-CM | POA: Diagnosis not present

## 2015-08-05 DIAGNOSIS — Z8619 Personal history of other infectious and parasitic diseases: Secondary | ICD-10-CM | POA: Diagnosis not present

## 2015-08-05 DIAGNOSIS — G8929 Other chronic pain: Secondary | ICD-10-CM | POA: Insufficient documentation

## 2015-08-05 LAB — BASIC METABOLIC PANEL
ANION GAP: 9 (ref 5–15)
BUN: 9 mg/dL (ref 6–20)
CHLORIDE: 100 mmol/L — AB (ref 101–111)
CO2: 24 mmol/L (ref 22–32)
Calcium: 9 mg/dL (ref 8.9–10.3)
Creatinine, Ser: 0.75 mg/dL (ref 0.44–1.00)
GFR calc Af Amer: >60 mL/min (ref >60)
Glucose, Bld: 118 mg/dL — ABNORMAL HIGH (ref 65–99)
POTASSIUM: 3.4 mmol/L — AB (ref 3.5–5.1)
SODIUM: 133 mmol/L — AB (ref 135–145)

## 2015-08-05 LAB — CBC
HEMATOCRIT: 41 % (ref 36.0–46.0)
HEMOGLOBIN: 13.7 g/dL (ref 12.0–15.0)
MCH: 29.9 pg (ref 26.0–34.0)
MCHC: 33.4 g/dL (ref 30.0–36.0)
MCV: 89.5 fL (ref 78.0–100.0)
Platelets: 337 10*3/uL (ref 150–400)
RBC: 4.58 MIL/uL (ref 3.87–5.11)
RDW: 13.7 % (ref 11.5–15.5)
WBC: 10.7 10*3/uL — AB (ref 4.0–10.5)

## 2015-08-05 LAB — I-STAT TROPONIN, ED: TROPONIN I, POC: 0 ng/mL (ref 0.00–0.08)

## 2015-08-05 LAB — TROPONIN I: Troponin I: <0.03 ng/mL (ref <0.031)

## 2015-08-05 MED ORDER — KETOROLAC TROMETHAMINE 15 MG/ML IJ SOLN
15.0000 mg | Freq: Once | INTRAMUSCULAR | Status: AC
  Administered 2015-08-05: 15 mg via INTRAVENOUS
  Filled 2015-08-05: qty 1

## 2015-08-05 MED ORDER — ASPIRIN 81 MG PO CHEW
324.0000 mg | CHEWABLE_TABLET | Freq: Once | ORAL | Status: AC
  Administered 2015-08-05: 324 mg via ORAL
  Filled 2015-08-05: qty 4

## 2015-08-05 NOTE — ED Notes (Signed)
Patient transported to X-ray 

## 2015-08-05 NOTE — Discharge Instructions (Signed)
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
## 2015-08-05 NOTE — ED Notes (Signed)
The pt is c/o lt upper chest pain with lt arm pain and hand numbness since last pm.. She has lt arm pain from breast cancer radiation  Not recent.  She reports that her lt arm has been painful since the radiation

## 2015-09-04 ENCOUNTER — Other Ambulatory Visit: Payer: Self-pay | Admitting: Family Medicine

## 2015-11-05 HISTORY — PX: COLONOSCOPY: SHX174

## 2016-01-03 ENCOUNTER — Encounter: Payer: Self-pay | Admitting: Family Medicine

## 2016-01-03 ENCOUNTER — Encounter: Payer: Self-pay | Admitting: *Deleted

## 2016-01-03 ENCOUNTER — Ambulatory Visit (INDEPENDENT_AMBULATORY_CARE_PROVIDER_SITE_OTHER): Payer: BLUE CROSS/BLUE SHIELD | Admitting: Family Medicine

## 2016-01-03 VITALS — BP 118/70 | HR 80 | Temp 98.1°F | Wt 179.2 lb

## 2016-01-03 DIAGNOSIS — M5416 Radiculopathy, lumbar region: Secondary | ICD-10-CM | POA: Insufficient documentation

## 2016-01-03 DIAGNOSIS — M5126 Other intervertebral disc displacement, lumbar region: Secondary | ICD-10-CM | POA: Diagnosis not present

## 2016-01-03 MED ORDER — CYCLOBENZAPRINE HCL 10 MG PO TABS: 5.0000 mg | ORAL_TABLET | Freq: Two times a day (BID) | ORAL | Status: DC | PRN

## 2016-01-03 MED ORDER — NAPROXEN 500 MG PO TABS: ORAL_TABLET | ORAL | Status: DC

## 2016-01-03 MED ORDER — DEXAMETHASONE SODIUM PHOSPHATE 10 MG/ML IJ SOLN
10.0000 mg | Freq: Once | INTRAMUSCULAR | Status: AC
  Administered 2016-01-03: 10 mg via INTRAMUSCULAR

## 2016-01-03 NOTE — Progress Notes (Signed)
   BP 118/70 mmHg  Pulse 80  Temp(Src) 98.1 F (36.7 C) (Oral)  Wt 179 lb 4 oz (81.307 kg)  LMP 02/14/2005 (Approximate)   CC: back pain x2d  Subjective:    Patient ID: Sabrina Wright, female    DOB: 26-Feb-1961, 55 y.o.   MRN: [id number]  HPI: Sabrina Wright is a 55 y.o. female presenting on 01/03/2016 for Back Pain   2d h/o LBP with radiation down left lateral leg past knee. Denies inciting trauma/injury to back. Progressively worsened. Denies numbness, fevers/chills, bowel/bladder incontinence. No hematuria.   Taking advil 400mg  without improvement.   Relevant past medical, surgical, family and social history reviewed and updated as indicated. Interim medical history since our last visit reviewed. Allergies and medications reviewed and updated. Current Outpatient Prescriptions on File Prior to Visit  Medication Sig  . amitriptyline (ELAVIL) 50 MG tablet Take 2 tablets (100 mg total) by mouth at bedtime.  . Cholecalciferol (VITAMIN D PO) Take 1 capsule by mouth at bedtime.  . clobetasol cream (TEMOVATE) [id number] % APPLY 1 APPLICATION TOPICALLY 2 (TWO) TIMES DAILY. APPLY TO AFFECTED AREA NO MORE THAN 2 WEEKS AT A TIME+  . ibuprofen (ADVIL,MOTRIN) 200 MG tablet Take 400 mg by mouth every 6 (six) hours as needed (pain).  . Multiple Vitamin (MULTIVITAMIN WITH MINERALS) TABS tablet Take 1 tablet by mouth at bedtime.  Marland Kitchen alendronate (FOSAMAX) 70 MG tablet TAKE 1 TABLET BY MOUTH 30 MINUTES BEFOREBREAKFAST ONCE A WEEK WITH ATLEAST 8 OZ. OF WATER. (Patient not taking: Reported on 08/05/2015)   No current facility-administered medications on file prior to visit.    Review of Systems Per HPI unless specifically indicated in ROS section     Objective:    BP 118/70 mmHg  Pulse 80  Temp(Src) 98.1 F (36.7 C) (Oral)  Wt 179 lb 4 oz (81.307 kg)  LMP 02/14/2005 (Approximate)  Wt Readings from Last 3 Encounters:  01/03/16 179 lb 4 oz (81.307 kg)  08/05/15 170 lb (77.111 kg)  03/21/15 177 lb  (80.287 kg)    Physical Exam  Constitutional: She is oriented to person, place, and time. She appears well-developed and well-nourished. No distress.  Musculoskeletal: She exhibits no edema.  + mid lumbar pain midline spine ++ L lumbar paraspinous mm tenderness ++ SLR L>R No pain with int/ext rotation at hip. Discomfort with palpation of L SIJ and sciatic notch  Neurological: She is alert and oriented to person, place, and time. She has normal strength. No sensory deficit.  5/5 strength BLE Antalgic gait  Skin: Skin is warm and dry. No rash noted.  Psychiatric: She has a normal mood and affect.  Nursing note and vitals reviewed.     Assessment & Plan:   Problem List Items Addressed This Visit    Lumbar herniated disc - Primary    Anticipate L sided lumbar HNP with +SLR L>R. Treat with dexamethasone 10mg  IM today, then start naprosyn/flexeril course. Update if not improving with treatment, update sooner if worsening or loss of strength of L leg. Out of work for 3 days.  Pt agrees with plan.          Follow up plan: Return if symptoms worsen or fail to improve.

## 2016-01-03 NOTE — Progress Notes (Signed)
Pre visit review using our clinic review tool, if applicable. No additional management support is needed unless otherwise documented below in the visit note. 

## 2016-01-03 NOTE — Assessment & Plan Note (Addendum)
Anticipate L sided lumbar HNP with +SLR L>R. Treat with dexamethasone 10mg  IM today, then start naprosyn/flexeril course. Update if not improving with treatment, update sooner if worsening or loss of strength of L leg. Out of work for 3 days.  Pt agrees with plan.

## 2016-01-03 NOTE — Patient Instructions (Signed)
You likely have bulging or herniated disc causing your pain. Steroid shot today then take naprosyn anti inflammatory twice daily with food and flexeril muscle relaxant (caution it can make you sleepy).  Let us know if not improving with this for further treatment/evaluation.  Herniated Disk A herniated disk occurs when a disk in your spine bulges out too far. This condition is also called a ruptured disk or slipped disk. Your spine (backbone) is made up of bones called vertebrae. Between each pair of vertebrae is an oval disk with a soft, spongy center that acts as a shock absorber when you move. The spongy center is surrounded by a tough outer ring. When you have a herniated disk, the spongy center of the disk bulges out or ruptures through the outer ring. A herniated disk can press on a nerve between your vertebrae and cause pain. A herniated disk can occur anywhere in your back or neck area, but the lower back is the most common spot. CAUSES  In many cases, a herniated disk occurs just from getting older. As you age, the spongy insides of your disks tend to shrink and dry out. A herniated disk can result from gradual wear and tear. Injury or sudden strain can also cause a herniated disk.  RISK FACTORS Aging is the main risk factor for a herniated disk. Other risk factors include:  Being a man between the ages of 77 and 20 years.  Having a job that requires heavy lifting, bending, or twisting.  Having a job that requires long hours of driving.  Not getting enough exercise.  Being overweight.  Smoking. SIGNS AND SYMPTOMS  Signs and symptoms depend on which disk is herniated.  For a herniated disk in the lower back, you may have sharp pain in:  One part of your leg, hip, or buttocks.  The back of your calf.  The top or sole of your foot (sciatica).   For a herniated disk in the neck, you may feel pain:  When you move your neck.  Near or over your shoulder blade.  That moves  to your upper arm, forearm, or fingers.   You may also have muscle weakness. It may be hard to:  Lift your leg or arm.  Stand on your toes.  Squeeze tightly with one of your hands.  Other symptoms can include:  Numbness or tingling in the affected areas of your body.  Loss of bladder or bowel control. This is a rare but serious sign of a severe herniated disk in the lower back. DIAGNOSIS  Your health care provider will do a physical exam. During this exam, you may have to move certain body parts or assume various positions. For example, your health care provider may do the straight-leg test. This is a good way to test for a herniated disk in your lower back. In this test, the health care provider lifts your leg while you lie on your back. This is to see if you feel pain down your leg. Your health care provider will also check for numbness or loss of feeling.  Your health care provider will also check your:  Reflexes.  Muscle strength.  Posture.  Other tests may be done to help in making a diagnosis. These may include:  An X-ray of the spine to rule out other causes of back pain.   Other imaging studies, such as an MRI or CT scan. This is to check whether the herniated disk is pressing on your spinal  canal.  Electromyography (EMG). This test checks the nerves that control muscles. It is sometimes used to identify the specific area of nerve involvement.  TREATMENT  In many cases, herniated disk symptoms go away over a period of days or weeks. You will most likely be free of symptoms in 3-4 months. Treatment may include the following:  The initial treatment for a herniated disk is ashort period of rest.  Bed rest is often limited to 1 or 2 days. Resting for too long delays recovery.  If you have a herniated disk in your lower back, you should avoid sitting as much as possible because sitting increases pressure on the disk.  Medicines. These may include:   Nonsteroidal  anti-inflammatory drugs (NSAIDs).  Muscle relaxants for back spasms.  Narcotic pain medicine if your pain is very bad.   Steroid injections. You may need these along the involved nerve root to help control pain. The steroid is injected in the area of the herniated disk. It helps by reducing swelling around the disk.  Physical therapy. This may include exercises to strengthen the muscles that help support your spine.   You may need surgery if other treatments do not work.  HOME CARE INSTRUCTIONS Follow all your health care provider's instructions. These may include:  Take all medicines as directed by your health care provider.  Rest for 2 days and then start moving.  Do not sit or stand for long periods of time.  Maintain good posture when sitting and standing.  Avoid movements that cause pain, such as bending or lifting.  When you are able to start lifting things again:  [place] with your knees.  Keep your back straight.  Hold heavy objects close to your body.  If you are overweight, ask your health care provider to help you start a weight-loss program.  When you are able to start exercising, ask your health care provider how much and what type of exercise is best for you.  Work with a physical therapist on stretching and strengthening exercises for your back.  Do not wear high-heeled shoes.  Do not sleep on your belly.  Do not smoke.  Keep all follow-up visits as directed by your health care provider. SEEK MEDICAL CARE IF:  You have back or neck pain that is not getting better after 4 weeks.  You have very bad pain in your back or neck.  You develop numbness, tingling, or weakness along with pain. SEEK IMMEDIATE MEDICAL CARE IF:   You have numbness, tingling, or weakness that makes you unable to use your arms or legs.  You lose control of your bladder or bowels.  You have dizziness or fainting.  You have shortness of breath.  MAKE SURE YOU:    Understand these instructions.  Will watch your condition.  Will get help right away if you are not doing well or get worse.   This information is not intended to replace advice given to you by your health care provider. Make sure you discuss any questions you have with your health care provider.   Document Released: 10/18/2000 Document Revised: 11/11/2014 Document Reviewed: 09/24/2013 Elsevier Interactive Patient Education [employer].

## 2016-01-03 NOTE — Addendum Note (Signed)
Addended by: Royann Shivers A on: 01/03/2016 11:02 AM   Modules accepted: Orders

## 2016-01-09 ENCOUNTER — Other Ambulatory Visit: Payer: Self-pay

## 2016-01-09 DIAGNOSIS — Z1231 Encounter for screening mammogram for malignant neoplasm of breast: Secondary | ICD-10-CM

## 2016-01-19 ENCOUNTER — Ambulatory Visit
Admission: RE | Admit: 2016-01-19 | Discharge: 2016-01-19 | Disposition: A | Discharge status: Home / Self Care | Payer: BLUE CROSS/BLUE SHIELD | Source: Ambulatory Visit

## 2016-01-19 DIAGNOSIS — Z1231 Encounter for screening mammogram for malignant neoplasm of breast: Secondary | ICD-10-CM

## 2016-01-19 LAB — HM MAMMOGRAPHY: HM MAMMO: NORMAL

## 2016-01-22 ENCOUNTER — Encounter: Payer: Self-pay | Admitting: *Deleted

## 2016-02-07 ENCOUNTER — Other Ambulatory Visit: Payer: Self-pay | Admitting: Family Medicine

## 2016-02-07 DIAGNOSIS — E559 Vitamin D deficiency, unspecified: Secondary | ICD-10-CM

## 2016-02-07 DIAGNOSIS — Z1159 Encounter for screening for other viral diseases: Secondary | ICD-10-CM

## 2016-02-07 DIAGNOSIS — D649 Anemia, unspecified: Secondary | ICD-10-CM

## 2016-02-07 DIAGNOSIS — M810 Age-related osteoporosis without current pathological fracture: Secondary | ICD-10-CM

## 2016-02-08 ENCOUNTER — Other Ambulatory Visit: Payer: Self-pay | Admitting: Family Medicine

## 2016-02-08 ENCOUNTER — Other Ambulatory Visit (INDEPENDENT_AMBULATORY_CARE_PROVIDER_SITE_OTHER): Payer: BLUE CROSS/BLUE SHIELD

## 2016-02-08 DIAGNOSIS — Z1159 Encounter for screening for other viral diseases: Secondary | ICD-10-CM

## 2016-02-08 DIAGNOSIS — E559 Vitamin D deficiency, unspecified: Secondary | ICD-10-CM | POA: Diagnosis not present

## 2016-02-08 DIAGNOSIS — M810 Age-related osteoporosis without current pathological fracture: Secondary | ICD-10-CM

## 2016-02-08 DIAGNOSIS — D649 Anemia, unspecified: Secondary | ICD-10-CM | POA: Diagnosis not present

## 2016-02-08 LAB — CBC WITH DIFFERENTIAL/PLATELET
BASOS PCT: 0.9 % (ref 0.0–3.0)
Basophils Absolute: 0.1 10*3/uL (ref 0.0–0.1)
EOS ABS: 0.3 10*3/uL (ref 0.0–0.7)
EOS PCT: 4 % (ref 0.0–5.0)
HCT: 38.4 % (ref 36.0–46.0)
Hemoglobin: 12.8 g/dL (ref 12.0–15.0)
LYMPHS ABS: 1.8 10*3/uL (ref 0.7–4.0)
Lymphocytes Relative: 22.9 % (ref 12.0–46.0)
MCHC: 33.3 g/dL (ref 30.0–36.0)
MCV: 88 fl (ref 78.0–100.0)
MONO ABS: 0.5 10*3/uL (ref 0.1–1.0)
Monocytes Relative: 6.2 % (ref 3.0–12.0)
NEUTROS PCT: 66 % (ref 43.0–77.0)
Neutro Abs: 5.2 10*3/uL (ref 1.4–7.7)
Platelets: 348 10*3/uL (ref 150.0–400.0)
RBC: 4.36 Mil/uL (ref 3.87–5.11)
RDW: 14.1 % (ref 11.5–15.5)
WBC: 7.9 10*3/uL (ref 4.0–10.5)

## 2016-02-08 LAB — BASIC METABOLIC PANEL
BUN: 14 mg/dL (ref 6–23)
CALCIUM: 9.5 mg/dL (ref 8.4–10.5)
CHLORIDE: 104 meq/L (ref 96–112)
CO2: 28 mEq/L (ref 19–32)
CREATININE: 0.77 mg/dL (ref 0.40–1.20)
GFR: 82.72 mL/min (ref >60.00)
Glucose, Bld: 117 mg/dL — ABNORMAL HIGH (ref 70–99)
Potassium: 4 mEq/L (ref 3.5–5.1)
Sodium: 139 mEq/L (ref 135–145)

## 2016-02-08 LAB — HEPATITIS C ANTIBODY: HCV AB: NEGATIVE

## 2016-02-08 LAB — VITAMIN D 25 HYDROXY (VIT D DEFICIENCY, FRACTURES): VITD: 21.04 ng/mL — AB (ref 30.00–100.00)

## 2016-02-12 ENCOUNTER — Ambulatory Visit (INDEPENDENT_AMBULATORY_CARE_PROVIDER_SITE_OTHER): Payer: BLUE CROSS/BLUE SHIELD | Admitting: Family Medicine

## 2016-02-12 ENCOUNTER — Encounter: Payer: Self-pay | Admitting: *Deleted

## 2016-02-12 ENCOUNTER — Encounter: Payer: Self-pay | Admitting: Physical Medicine and Rehabilitation

## 2016-02-12 ENCOUNTER — Encounter: Payer: Self-pay | Admitting: Family Medicine

## 2016-02-12 VITALS — BP 102/64 | HR 72 | Temp 98.2°F | Ht 64.0 in | Wt 178.2 lb

## 2016-02-12 DIAGNOSIS — Z0000 Encounter for general adult medical examination without abnormal findings: Secondary | ICD-10-CM | POA: Insufficient documentation

## 2016-02-12 DIAGNOSIS — E559 Vitamin D deficiency, unspecified: Secondary | ICD-10-CM

## 2016-02-12 DIAGNOSIS — Z87891 Personal history of nicotine dependence: Secondary | ICD-10-CM

## 2016-02-12 DIAGNOSIS — F411 Generalized anxiety disorder: Secondary | ICD-10-CM

## 2016-02-12 DIAGNOSIS — N950 Postmenopausal bleeding: Secondary | ICD-10-CM

## 2016-02-12 DIAGNOSIS — M792 Neuralgia and neuritis, unspecified: Secondary | ICD-10-CM

## 2016-02-12 DIAGNOSIS — Z853 Personal history of malignant neoplasm of breast: Secondary | ICD-10-CM

## 2016-02-12 DIAGNOSIS — M810 Age-related osteoporosis without current pathological fracture: Secondary | ICD-10-CM

## 2016-02-12 DIAGNOSIS — F410 Panic disorder [episodic paroxysmal anxiety] without agoraphobia: Secondary | ICD-10-CM | POA: Insufficient documentation

## 2016-02-12 MED ORDER — PREGABALIN 75 MG PO CAPS: 75.0000 mg | ORAL_CAPSULE | Freq: Two times a day (BID) | ORAL | Status: DC

## 2016-02-12 MED ORDER — IBANDRONATE SODIUM 150 MG PO TABS: 150.0000 mg | ORAL_TABLET | ORAL | Status: DC

## 2016-02-12 MED ORDER — SERTRALINE HCL 50 MG PO TABS: 50.0000 mg | ORAL_TABLET | Freq: Every day | ORAL | Status: DC

## 2016-02-12 NOTE — Assessment & Plan Note (Signed)
No further bleeding episodes. 

## 2016-02-12 NOTE — Assessment & Plan Note (Signed)
Notes worsening anxiety since quit smoking, worse as passenger in car. Will trial sertraline 50mg  daily to local pharmacy. RTC 6 mo f/u visit.

## 2016-02-12 NOTE — Assessment & Plan Note (Signed)
Has not been able to take fosamax weekly (trouble remembering). Will trial boniva once monthly.

## 2016-02-12 NOTE — Assessment & Plan Note (Signed)
Monthly breast exams, yearly mammogram.s

## 2016-02-12 NOTE — Progress Notes (Signed)
BP 102/64 mmHg  Pulse 72  Temp(Src) 98.2 F (36.8 C) (Oral)  Ht 5\' 4"  (1.626 m)  Wt 178 lb 4 oz (80.854 kg)  BMI 30.58 kg/m2  LMP 02/14/2005 (Approximate)   CC: CPE  Subjective:    Patient ID: Sabrina Wright, female    DOB: Apr 24, 1961, 55 y.o.   MRN: [id number]  HPI: Sabrina Wright is a 55 y.o. female presenting on 02/12/2016 for Annual Exam   Quit smoking 12/06/2015!! Husband had MI. Anxiety has worsened however. Has been to ER twice with anxiety attacks. Notices worse with riding in cars.   Continue L arm pain from ?radiation form breast cancer - on amitriptyline 100mg  nightly which helps but some persistent pain. lyrica worked better. Gabapentin didn't help. Requests retrial lyrica.   Post menopausal bleeding - s/p biopsy with benign endometrial polyp and tissue.   Osteoporosis - doesn't remember to take fosamax.   Requests note for work to be able to have water with her.  Preventative: Colon cancer screening - never returned prior stool kits - forgot. Actually turned in today.  Mammogram normal 01/2016 - h/o breast cancer Well woman exam - normal 2016. h/o abnl paps in past, 3 latest were normal. Rpt around 2018. No further bleeding.  LMP 2005 - early menopause from chemo. Flu shot - allergic to eggs so does not receive. Tdap 02/2015 Pneumovax 02/2015 Seat belt use discussed Sunscreen use discussed. No changing moles on skin.  Lives with husband and 2 daughters, 2 dogs and 2 cats  Occupation: Animal nutritionist at [employer]  Edu: HS  Activity: walks daily Diet: good water, fruits/vegetables daily, red meat 2x/wk, fish rarely.  Relevant past medical, surgical, family and social history reviewed and updated as indicated. Interim medical history since our last visit reviewed. Allergies and medications reviewed and updated. Current Outpatient Prescriptions on File Prior to Visit  Medication Sig  . amitriptyline (ELAVIL) 50 MG tablet Take 2 tablets (100 mg total)  by mouth at bedtime.  . clobetasol cream (TEMOVATE) [id number] % APPLY 1 APPLICATION TOPICALLY 2 (TWO) TIMES DAILY. APPLY TO AFFECTED AREA NO MORE THAN 2 WEEKS AT A TIME+ (Patient taking differently: APPLY 1 APPLICATION TOPICALLY 2 (TWO) TIMES DAILY. APPLY TO AFFECTED AREA NO MORE THAN 2 WEEKS AT A TIME+ as needed)  . ibuprofen (ADVIL,MOTRIN) 200 MG tablet Take 400 mg by mouth every 6 (six) hours as needed (pain).  . Multiple Vitamin (MULTIVITAMIN WITH MINERALS) TABS tablet Take 1 tablet by mouth at bedtime.   No current facility-administered medications on file prior to visit.    Review of Systems  Constitutional: Negative for fever, chills, activity change, appetite change, fatigue and unexpected weight change.  HENT: Negative for hearing loss.   Eyes: Negative for visual disturbance.  Respiratory: Positive for chest tightness. Negative for cough, shortness of breath and wheezing.   Cardiovascular: Negative for chest pain, palpitations and leg swelling.  Gastrointestinal: Negative for nausea, vomiting, abdominal pain, diarrhea, constipation, blood in stool and abdominal distention.  Genitourinary: Negative for hematuria and difficulty urinating.  Musculoskeletal: Negative for myalgias, arthralgias and neck pain.  Skin: Negative for rash.  Neurological: Negative for dizziness, seizures, syncope and headaches.  Hematological: Negative for adenopathy. Does not bruise/bleed easily.  Psychiatric/Behavioral: Negative for dysphoric mood. The patient is nervous/anxious.    Per HPI unless specifically indicated in ROS section     Objective:    BP 102/64 mmHg  Pulse 72  Temp(Src) 98.2 F (36.8 C) (  Oral)  Ht 5\' 4"  (1.626 m)  Wt 178 lb 4 oz (80.854 kg)  BMI 30.58 kg/m2  LMP 02/14/2005 (Approximate)  Wt Readings from Last 3 Encounters:  02/12/16 178 lb 4 oz (80.854 kg)  01/03/16 179 lb 4 oz (81.307 kg)  08/05/15 170 lb (77.111 kg)    Physical Exam  Constitutional: She is oriented to person,  place, and time. She appears well-developed and well-nourished. No distress.  HENT:  Head: Normocephalic and atraumatic.  Right Ear: Hearing, tympanic membrane, external ear and ear canal normal.  Left Ear: Hearing, tympanic membrane, external ear and ear canal normal.  Nose: Nose normal.  Mouth/Throat: Uvula is midline, oropharynx is clear and moist and mucous membranes are normal. No oropharyngeal exudate, posterior oropharyngeal edema or posterior oropharyngeal erythema.  Eyes: Conjunctivae and EOM are normal. Pupils are equal, round, and reactive to light. No scleral icterus.  Neck: Normal range of motion. Neck supple. No thyromegaly present.  Cardiovascular: Normal rate, regular rhythm, normal heart sounds and intact distal pulses.   No murmur heard. Pulses:      Radial pulses are 2+ on the right side, and 2+ on the left side.  Pulmonary/Chest: Effort normal and breath sounds normal. No respiratory distress. She has no wheezes. She has no rales.  Abdominal: Soft. Bowel sounds are normal. She exhibits no distension and no mass. There is no tenderness. There is no rebound and no guarding.  Musculoskeletal: Normal range of motion. She exhibits no edema.  Lymphadenopathy:    She has no cervical adenopathy.  Neurological: She is alert and oriented to person, place, and time.  CN grossly intact, station and gait intact  Skin: Skin is warm and dry. No rash noted.  Psychiatric: She has a normal mood and affect. Her behavior is normal. Judgment and thought content normal.  Nursing note and vitals reviewed.  Results for orders placed or performed in visit on 02/08/16  Hepatitis C antibody  Result Value Ref Range   HCV Ab NEGATIVE NEGATIVE      Assessment & Plan:   Problem List Items Addressed This Visit    History of left breast cancer    Monthly breast exams, yearly mammogram.s      Ex-smoker    Congratulated on quitting smoking 12/2015.      Neuropathic pain, arm    Requests  retrial lyrica. Sent to pharmacy. Coupon provided today. Discussed taper off amitriptyline if can continue lyrica      Osteoporosis    Has not been able to take fosamax weekly (trouble remembering). Will trial boniva once monthly.       Relevant Medications   ibandronate (BONIVA) 150 MG tablet   Cholecalciferol (VITAMIN D) 2000 units CAPS   Postmenopausal bleeding    No further bleeding episodes      Vitamin D deficiency    Still deficient despite regularly taking 2000 IU daily. Will increase to 4000 IU daily. Pt has difficulty remembering to take weekly dosing (see fosamax above)      Health maintenance examination - Primary    Preventative protocols reviewed and updated unless pt declined. Discussed healthy diet and lifestyle.       Anxiety state    Notes worsening anxiety since quit smoking, worse as passenger in car. Will trial sertraline 50mg  daily to local pharmacy. RTC 6 mo f/u visit.      Relevant Medications   sertraline (ZOLOFT) 50 MG tablet       Follow up plan: Return  in about 6 months (around 08/13/2016), or as needed, for follow up visit.  [name], MD

## 2016-02-12 NOTE — Assessment & Plan Note (Signed)
Congratulated on quitting smoking 12/2015.

## 2016-02-12 NOTE — Assessment & Plan Note (Signed)
Requests retrial lyrica. Sent to pharmacy. Coupon provided today. Discussed taper off amitriptyline if can continue lyrica

## 2016-02-12 NOTE — Assessment & Plan Note (Signed)
Preventative protocols reviewed and updated unless pt declined. Discussed healthy diet and lifestyle.  

## 2016-02-12 NOTE — Assessment & Plan Note (Signed)
Still deficient despite regularly taking 2000 IU daily. Will increase to 4000 IU daily. Pt has difficulty remembering to take weekly dosing (see fosamax above)

## 2016-02-12 NOTE — Progress Notes (Signed)
Pre visit review using our clinic review tool, if applicable. No additional management support is needed unless otherwise documented below in the visit note. 

## 2016-02-12 NOTE — Patient Instructions (Addendum)
Trial once monthly boniva sent to pharmacy for bone strength. Price out lyrica 24m twice daily. If insurance covers this, slowly taper off amitriptyline (1 pill daily for 2 weeks then decrease to 1/2 pill daily for 2 weeks).  Trial zoloft (sertraline) 574mdaily for anxiety.  Increase vitamin D to 2 pills daily for 1 month to boost replacement. Return in 6 months for follow up visit with labs.  Health Maintenance, Female Adopting a healthy lifestyle and getting preventive care can go a long way to promote health and wellness. Talk with your health care provider about what schedule of regular examinations is right for you. This is a good chance for you to check in with your provider about disease prevention and staying healthy. In between checkups, there are plenty of things you can do on your own. Experts have done a lot of research about which lifestyle changes and preventive measures are most likely to keep you healthy. Ask your health care provider for more information. WEIGHT AND DIET  Eat a healthy diet  Be sure to include plenty of vegetables, fruits, low-fat dairy products, and lean protein.  Do not eat a lot of foods high in solid fats, added sugars, or salt.  Get regular exercise. This is one of the most important things you can do for your health.  Most adults should exercise for at least 150 minutes each week. The exercise should increase your heart rate and make you sweat (moderate-intensity exercise).  Most adults should also do strengthening exercises at least twice a week. This is in addition to the moderate-intensity exercise.  Maintain a healthy weight  Body mass index (BMI) is a measurement that can be used to identify possible weight problems. It estimates body fat based on height and weight. Your health care provider can help determine your BMI and help you achieve or maintain a healthy weight.  For females 2034ears of age and older:   A BMI below 18.5 is considered  underweight.  A BMI of 18.5 to 24.9 is normal.  A BMI of 25 to 29.9 is considered overweight.  A BMI of 30 and above is considered obese.  Watch levels of cholesterol and blood lipids  You should start having your blood tested for lipids and cholesterol at 2012ears of age, then have this test every 5 years.  You may need to have your cholesterol levels checked more often if:  Your lipid or cholesterol levels are high.  You are older than 5044ears of age.  You are at high risk for heart disease.  CANCER SCREENING   Lung Cancer  Lung cancer screening is recommended for adults [id number] old who are at high risk for lung cancer because of a history of smoking.  A yearly low-dose CT scan of the lungs is recommended for people who:  Currently smoke.  Have quit within the past 15 years.  Have at least a 30-pack-year history of smoking. A pack year is smoking an average of one pack of cigarettes a day for 1 year.  Yearly screening should continue until it has been 15 years since you quit.  Yearly screening should stop if you develop a health problem that would prevent you from having lung cancer treatment.  Breast Cancer  Practice breast self-awareness. This means understanding how your breasts normally appear and feel.  It also means doing regular breast self-exams. Let your health care provider know about any changes, no matter how small.  If you  are in your 20s or 30s, you should have a clinical breast exam (CBE) by a health care provider every 1-3 years as part of a regular health exam.  If you are 90 or older, have a CBE every year. Also consider having a breast X-ray (mammogram) every year.  If you have a family history of breast cancer, talk to your health care provider about genetic screening.  If you are at high risk for breast cancer, talk to your health care provider about having an MRI and a mammogram every year.  Breast cancer gene (BRCA) assessment is  recommended for women who have family members with BRCA-related cancers. BRCA-related cancers include:  Breast.  Ovarian.  Tubal.  Peritoneal cancers.  Results of the assessment will determine the need for genetic counseling and BRCA1 and BRCA2 testing. Cervical Cancer Your health care provider may recommend that you be screened regularly for cancer of the pelvic organs (ovaries, uterus, and vagina). This screening involves a pelvic examination, including checking for microscopic changes to the surface of your cervix (Pap test). You may be encouraged to have this screening done every 3 years, beginning at age 67.  For women ages 93-65, health care providers may recommend pelvic exams and Pap testing every 3 years, or they may recommend the Pap and pelvic exam, combined with testing for human papilloma virus (HPV), every 5 years. Some types of HPV increase your risk of cervical cancer. Testing for HPV may also be done on women of any age with unclear Pap test results.  Other health care providers may not recommend any screening for nonpregnant women who are considered low risk for pelvic cancer and who do not have symptoms. Ask your health care provider if a screening pelvic exam is right for you.  If you have had past treatment for cervical cancer or a condition that could lead to cancer, you need Pap tests and screening for cancer for at least 20 years after your treatment. If Pap tests have been discontinued, your risk factors (such as having a new sexual partner) need to be reassessed to determine if screening should resume. Some women have medical problems that increase the chance of getting cervical cancer. In these cases, your health care provider may recommend more frequent screening and Pap tests. Colorectal Cancer  This type of cancer can be detected and often prevented.  Routine colorectal cancer screening usually begins at 55 years of age and continues through 55 years of  age.  Your health care provider may recommend screening at an earlier age if you have risk factors for colon cancer.  Your health care provider may also recommend using home test kits to check for hidden blood in the stool.  A small camera at the end of a tube can be used to examine your colon directly (sigmoidoscopy or colonoscopy). This is done to check for the earliest forms of colorectal cancer.  Routine screening usually begins at age 97.  Direct examination of the colon should be repeated every 5-10 years through 55 years of age. However, you may need to be screened more often if early forms of precancerous polyps or small growths are found. Skin Cancer  Check your skin from head to toe regularly.  Tell your health care provider about any new moles or changes in moles, especially if there is a change in a mole's shape or color.  Also tell your health care provider if you have a mole that is larger than the size of  a pencil eraser.  Always use sunscreen. Apply sunscreen liberally and repeatedly throughout the day.  Protect yourself by wearing long sleeves, pants, a wide-brimmed hat, and sunglasses whenever you are outside. HEART DISEASE, DIABETES, AND HIGH BLOOD PRESSURE   High blood pressure causes heart disease and increases the risk of stroke. High blood pressure is more likely to develop in:  People who have blood pressure in the high end of the normal range (130-139/85-89 mm Hg).  People who are overweight or obese.  People who are African American.  If you are 67-29 years of age, have your blood pressure checked every 3-5 years. If you are 30 years of age or older, have your blood pressure checked every year. You should have your blood pressure measured twice--once when you are at a hospital or clinic, and once when you are not at a hospital or clinic. Record the average of the two measurements. To check your blood pressure when you are not at a hospital or clinic, you can  use:  An automated blood pressure machine at a pharmacy.  A home blood pressure monitor.  If you are between 98 years and 54 years old, ask your health care provider if you should take aspirin to prevent strokes.  Have regular diabetes screenings. This involves taking a blood sample to check your fasting blood sugar level.  If you are at a normal weight and have a low risk for diabetes, have this test once every three years after 55 years of age.  If you are overweight and have a high risk for diabetes, consider being tested at a younger age or more often. PREVENTING INFECTION  Hepatitis B  If you have a higher risk for hepatitis B, you should be screened for this virus. You are considered at high risk for hepatitis B if:  You were born in a country where hepatitis B is common. Ask your health care provider which countries are considered high risk.  Your parents were born in a high-risk country, and you have not been immunized against hepatitis B (hepatitis B vaccine).  You have HIV or AIDS.  You use needles to inject street drugs.  You live with someone who has hepatitis B.  You have had sex with someone who has hepatitis B.  You get hemodialysis treatment.  You take certain medicines for conditions, including cancer, organ transplantation, and autoimmune conditions. Hepatitis C  Blood testing is recommended for:  Everyone born from [birth year] through 1965.  Anyone with known risk factors for hepatitis C. Sexually transmitted infections (STIs)  You should be screened for sexually transmitted infections (STIs) including gonorrhea and chlamydia if:  You are sexually active and are younger than 55 years of age.  You are older than 55 years of age and your health care provider tells you that you are at risk for this type of infection.  Your sexual activity has changed since you were last screened and you are at an increased risk for chlamydia or gonorrhea. Ask your health care  provider if you are at risk.  If you do not have HIV, but are at risk, it may be recommended that you take a prescription medicine daily to prevent HIV infection. This is called pre-exposure prophylaxis (PrEP). You are considered at risk if:  You are sexually active and do not regularly use condoms or know the HIV status of your partner(s).  You take drugs by injection.  You are sexually active with a partner who has HIV.  Talk with your health care provider about whether you are at high risk of being infected with HIV. If you choose to begin PrEP, you should first be tested for HIV. You should then be tested every 3 months for as long as you are taking PrEP.  PREGNANCY   If you are premenopausal and you may become pregnant, ask your health care provider about preconception counseling.  If you may become pregnant, take 400 to 800 micrograms (mcg) of folic acid every day.  If you want to prevent pregnancy, talk to your health care provider about birth control (contraception). OSTEOPOROSIS AND MENOPAUSE   Osteoporosis is a disease in which the bones lose minerals and strength with aging. This can result in serious bone fractures. Your risk for osteoporosis can be identified using a bone density scan.  If you are 35 years of age or older, or if you are at risk for osteoporosis and fractures, ask your health care provider if you should be screened.  Ask your health care provider whether you should take a calcium or vitamin D supplement to lower your risk for osteoporosis.  Menopause may have certain physical symptoms and risks.  Hormone replacement therapy may reduce some of these symptoms and risks. Talk to your health care provider about whether hormone replacement therapy is right for you.  HOME CARE INSTRUCTIONS   Schedule regular health, dental, and eye exams.  Stay current with your immunizations.   Do not use any tobacco products including cigarettes, chewing tobacco, or  electronic cigarettes.  If you are pregnant, do not drink alcohol.  If you are breastfeeding, limit how much and how often you drink alcohol.  Limit alcohol intake to no more than 1 drink per day for nonpregnant women. One drink equals 12 ounces of beer, 5 ounces of wine, or 1 ounces of hard liquor.  Do not use street drugs.  Do not share needles.  Ask your health care provider for help if you need support or information about quitting drugs.  Tell your health care provider if you often feel depressed.  Tell your health care provider if you have ever been abused or do not feel safe at home.   This information is not intended to replace advice given to you by your health care provider. Make sure you discuss any questions you have with your health care provider.   Document Released: 05/06/2011 Document Revised: 11/11/2014 Document Reviewed: 09/22/2013 Elsevier Interactive Patient Education [employer].

## 2016-02-15 ENCOUNTER — Other Ambulatory Visit: Payer: BLUE CROSS/BLUE SHIELD

## 2016-02-15 DIAGNOSIS — Z1211 Encounter for screening for malignant neoplasm of colon: Secondary | ICD-10-CM

## 2016-02-15 LAB — FECAL OCCULT BLOOD, IMMUNOCHEMICAL: FECAL OCCULT BLD: POSITIVE — AB

## 2016-02-19 ENCOUNTER — Telehealth: Payer: Self-pay | Admitting: Family Medicine

## 2016-02-19 ENCOUNTER — Other Ambulatory Visit: Payer: Self-pay | Admitting: Family Medicine

## 2016-02-19 DIAGNOSIS — Z1211 Encounter for screening for malignant neoplasm of colon: Secondary | ICD-10-CM

## 2016-02-19 NOTE — Telephone Encounter (Signed)
Pt can not get Lyrica filled, it will cost her 2,000 dollars. SHe is askign to stay on what she was already on before.  cb number is [phone] Thanks

## 2016-02-21 MED ORDER — AMITRIPTYLINE HCL 100 MG PO TABS: 100.0000 mg | ORAL_TABLET | Freq: Every day | ORAL | Status: DC

## 2016-02-21 NOTE — Telephone Encounter (Signed)
Refilled amitriptyline. Sent in 100mg  dose so she can take one pill nightly. lyrica removed from med list.

## 2016-02-21 NOTE — Telephone Encounter (Signed)
Pt was notified of information below and verbalized understanding.

## 2016-02-22 ENCOUNTER — Ambulatory Visit (AMBULATORY_SURGERY_CENTER): Payer: Self-pay

## 2016-02-22 VITALS — Ht 64.0 in | Wt 182.2 lb

## 2016-02-22 DIAGNOSIS — Z1211 Encounter for screening for malignant neoplasm of colon: Secondary | ICD-10-CM

## 2016-02-22 NOTE — Progress Notes (Signed)
Eggs cause n and v only; no allergy to soy No home oxygen No diet meds No past problems with anesthesia  Has email and internet; registered for emmi

## 2016-02-27 ENCOUNTER — Encounter: Payer: Self-pay | Admitting: Gastroenterology

## 2016-02-27 ENCOUNTER — Ambulatory Visit (AMBULATORY_SURGERY_CENTER): Payer: BLUE CROSS/BLUE SHIELD | Admitting: Gastroenterology

## 2016-02-27 VITALS — BP 122/62 | HR 61 | Temp 97.3°F | Resp 14 | Ht 64.0 in | Wt 182.0 lb

## 2016-02-27 DIAGNOSIS — K621 Rectal polyp: Secondary | ICD-10-CM

## 2016-02-27 DIAGNOSIS — D123 Benign neoplasm of transverse colon: Secondary | ICD-10-CM

## 2016-02-27 DIAGNOSIS — D129 Benign neoplasm of anus and anal canal: Secondary | ICD-10-CM

## 2016-02-27 DIAGNOSIS — Z1211 Encounter for screening for malignant neoplasm of colon: Secondary | ICD-10-CM | POA: Diagnosis not present

## 2016-02-27 DIAGNOSIS — D128 Benign neoplasm of rectum: Secondary | ICD-10-CM

## 2016-02-27 MED ORDER — SODIUM CHLORIDE 0.9 % IV SOLN: 500.0000 mL | INTRAVENOUS | Status: DC

## 2016-02-27 NOTE — Progress Notes (Signed)
To recovery, report to Brown, RN, VSS. 

## 2016-02-27 NOTE — Patient Instructions (Signed)
YOU HAD AN ENDOSCOPIC PROCEDURE TODAY AT THE [facility]:   Refer to the procedure report that was given to you for any specific questions about what was found during the examination.  If the procedure report does not answer your questions, please call your gastroenterologist to clarify.  If you requested that your care partner not be given the details of your procedure findings, then the procedure report has been included in a sealed envelope for you to review at your convenience later.  YOU SHOULD EXPECT: Some feelings of bloating in the abdomen. Passage of more gas than usual.  Walking can help get rid of the air that was put into your GI tract during the procedure and reduce the bloating. If you had a lower endoscopy (such as a colonoscopy or flexible sigmoidoscopy) you may notice spotting of blood in your stool or on the toilet paper. If you underwent a bowel prep for your procedure, you may not have a normal bowel movement for a few days.  Please Note:  You might notice some irritation and congestion in your nose or some drainage.  This is from the oxygen used during your procedure.  There is no need for concern and it should clear up in a day or so.  SYMPTOMS TO REPORT IMMEDIATELY:   Following lower endoscopy (colonoscopy or flexible sigmoidoscopy):  Excessive amounts of blood in the stool  Significant tenderness or worsening of abdominal pains  Swelling of the abdomen that is new, acute  Fever of 100F or higher    For urgent or emergent issues, a gastroenterologist can be reached at any hour by calling [phone].   DIET: Your first meal following the procedure should be a small meal and then it is ok to progress to your normal diet. Heavy or fried foods are harder to digest and may make you feel nauseous or bloated.  Likewise, meals heavy in dairy and vegetables can increase bloating.  Drink plenty of fluids but you should avoid alcoholic beverages for 24  hours.  ACTIVITY:  You should plan to take it easy for the rest of today and you should NOT DRIVE or use heavy machinery until tomorrow (because of the sedation medicines used during the test).    FOLLOW UP: Our staff will call the number listed on your records the next business day following your procedure to check on you and address any questions or concerns that you may have regarding the information given to you following your procedure. If we do not reach you, we will leave a message.  However, if you are feeling well and you are not experiencing any problems, there is no need to return our call.  We will assume that you have returned to your regular daily activities without incident.  If any biopsies were taken you will be contacted by phone or by letter within the next 1-3 weeks.  Please call us at [phone] if you have not heard about the biopsies in 3 weeks.    SIGNATURES/CONFIDENTIALITY: You and/or your care partner have signed paperwork which will be entered into your electronic medical record.  These signatures attest to the fact that that the information above on your After Visit Summary has been reviewed and is understood.  Full responsibility of the confidentiality of this discharge information lies with you and/or your care-partner.   Resume medications. Information given on polyps,diverticulosis,hemorrhoids and high fiber diet. 

## 2016-02-27 NOTE — Op Note (Signed)
[place] Patient Name: Sabrina Wright Procedure Date: 02/27/2016 11:07 AM MRN: [id number] Endoscopist: Mauri Pole , MD Age: 55 Date of Birth: Mar 16, 1961 Gender: Female Procedure:                Colonoscopy Indications:              Screening for colorectal malignant neoplasm Medicines:                Monitored Anesthesia Care Procedure:                Pre-Anesthesia Assessment:                           - Prior to the procedure, a History and Physical                            was performed, and patient medications and                            allergies were reviewed. The patient's tolerance of                            previous anesthesia was also reviewed. The risks                            and benefits of the procedure and the sedation                            options and risks were discussed with the patient.                            All questions were answered, and informed consent                            was obtained. Prior Anticoagulants: The patient has                            taken no previous anticoagulant or antiplatelet                            agents. ASA Grade Assessment: II - A patient with                            mild systemic disease. After reviewing the risks                            and benefits, the patient was deemed in                            satisfactory condition to undergo the procedure.                           After obtaining informed consent, the colonoscope  was passed under direct vision. Throughout the                            procedure, the patient's blood pressure, pulse, and                            oxygen saturations were monitored continuously. The                            Model CF-HQ190L [id number]) scope was introduced                            through the anus and advanced to the the terminal                            ileum, with identification of the appendiceal                          orifice and IC valve. The colonoscopy was performed                            without difficulty. The patient tolerated the                            procedure well. The quality of the bowel                            preparation was good. The terminal ileum, ileocecal                            valve, appendiceal orifice, and rectum were                            photographed. Scope In: 11:17:19 AM Scope Out: 11:34:33 AM Scope Withdrawal Time: 0 hours 10 minutes 15 seconds  Total Procedure Duration: 0 hours 17 minutes 14 seconds  Findings:                 The perianal and digital rectal examinations were                            normal.                           A 7 mm polyp was found in the rectum. The polyp was                            sessile. The polyp was removed with a cold snare.                            Resection and retrieval were complete.                           Two sessile polyps were found in the rectum and  transverse colon. The polyps were 3 to 5 mm in                            size. These polyps were removed with a cold biopsy                            forceps. Resection and retrieval were complete.                           A few small-mouthed diverticula were found in the                            sigmoid colon.                           Non-bleeding internal hemorrhoids were found during                            retroflexion. The hemorrhoids were small. Complications:            No immediate complications. Estimated Blood Loss:     Estimated blood loss was minimal. Impression:               - One 7 mm polyp in the rectum, removed with a cold                            snare. Resected and retrieved.                           - Two 3 to 5 mm polyps in the rectum and in the                            transverse colon, removed with a cold biopsy                            forceps. Resected and retrieved.                            - Diverticulosis in the sigmoid colon.                           - Non-bleeding internal hemorrhoids. Recommendation:           - Patient has a contact number available for                            emergencies. The signs and symptoms of potential                            delayed complications were discussed with the                            patient. Return to normal activities tomorrow.  Written discharge instructions were provided to the                            patient.                           - Resume previous diet.                           - Continue present medications.                           - Await pathology results.                           - Repeat colonoscopy in 5 years for surveillance.                           - Return to GI clinic PRN. Mauri Pole, MD 02/27/2016 11:39:23 AM This report has been signed electronically.

## 2016-02-27 NOTE — Progress Notes (Signed)
Called to room to assist during endoscopic procedure.  Patient ID and intended procedure confirmed with present staff. Received instructions for my participation in the procedure from the performing physician.  

## 2016-02-28 ENCOUNTER — Telehealth: Payer: Self-pay | Admitting: *Deleted

## 2016-02-28 NOTE — Telephone Encounter (Signed)
  Follow up Call-  Call back number 02/27/2016  Post procedure Call Back phone  # [phone]  Permission to leave phone message Yes     Patient questions:  Do you have a fever, pain , or abdominal swelling? Yes.   Pain Score  2 *  Have you tolerated food without any problems? Yes.    Have you been able to return to your normal activities? Yes.    Do you have any questions about your discharge instructions: Diet   No. Medications  No. Follow up visit  No.  Do you have questions or concerns about your Care? No.  Actions: * If pain score is 4 or above: No action needed, pain <4.

## 2016-03-01 ENCOUNTER — Other Ambulatory Visit: Payer: Self-pay | Admitting: Family Medicine

## 2016-03-02 ENCOUNTER — Encounter: Payer: Self-pay | Admitting: Family Medicine

## 2016-03-09 ENCOUNTER — Encounter: Payer: Self-pay | Admitting: Gastroenterology

## 2016-03-13 ENCOUNTER — Encounter: Payer: Self-pay | Admitting: Family Medicine

## 2016-04-23 ENCOUNTER — Encounter: Payer: Self-pay | Admitting: Family Medicine

## 2016-04-23 ENCOUNTER — Ambulatory Visit (INDEPENDENT_AMBULATORY_CARE_PROVIDER_SITE_OTHER): Payer: BLUE CROSS/BLUE SHIELD | Admitting: Family Medicine

## 2016-04-23 VITALS — BP 110/60 | HR 82 | Temp 98.5°F | Ht 64.0 in | Wt 180.5 lb

## 2016-04-23 DIAGNOSIS — G5622 Lesion of ulnar nerve, left upper limb: Secondary | ICD-10-CM

## 2016-04-23 NOTE — Progress Notes (Signed)
Pre visit review using our clinic review tool, if applicable. No additional management support is needed unless otherwise documented below in the visit note. 
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
## 2016-05-09 ENCOUNTER — Ambulatory Visit (INDEPENDENT_AMBULATORY_CARE_PROVIDER_SITE_OTHER): Payer: BLUE CROSS/BLUE SHIELD | Admitting: Family Medicine

## 2016-05-09 ENCOUNTER — Encounter: Payer: Self-pay | Admitting: Family Medicine

## 2016-05-09 VITALS — BP 120/70 | HR 77 | Temp 98.2°F | Ht 64.0 in | Wt 181.8 lb

## 2016-05-09 DIAGNOSIS — G5622 Lesion of ulnar nerve, left upper limb: Secondary | ICD-10-CM | POA: Diagnosis not present

## 2016-05-09 DIAGNOSIS — M5412 Radiculopathy, cervical region: Secondary | ICD-10-CM | POA: Diagnosis not present

## 2016-05-09 DIAGNOSIS — M7702 Medial epicondylitis, left elbow: Secondary | ICD-10-CM | POA: Diagnosis not present

## 2016-05-09 DIAGNOSIS — M792 Neuralgia and neuritis, unspecified: Secondary | ICD-10-CM

## 2016-05-09 MED ORDER — METHYLPREDNISOLONE ACETATE 40 MG/ML IJ SUSP
40.0000 mg | Freq: Once | INTRAMUSCULAR | Status: AC
  Administered 2016-05-09: 40 mg via INTRA_ARTICULAR

## 2016-05-09 NOTE — Progress Notes (Signed)
Pre visit review using our clinic review tool, if applicable. No additional management support is needed unless otherwise documented below in the visit note. 
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
## 2016-06-05 ENCOUNTER — Other Ambulatory Visit: Payer: Self-pay | Admitting: Family Medicine

## 2016-06-05 ENCOUNTER — Encounter: Payer: Self-pay | Admitting: *Deleted

## 2016-06-05 ENCOUNTER — Encounter: Payer: Self-pay | Admitting: Family Medicine

## 2016-06-05 ENCOUNTER — Ambulatory Visit (INDEPENDENT_AMBULATORY_CARE_PROVIDER_SITE_OTHER): Payer: BLUE CROSS/BLUE SHIELD | Admitting: Family Medicine

## 2016-06-05 VITALS — BP 124/84 | HR 86 | Temp 98.3°F | Wt 183.2 lb

## 2016-06-05 DIAGNOSIS — M7702 Medial epicondylitis, left elbow: Secondary | ICD-10-CM | POA: Insufficient documentation

## 2016-06-05 MED ORDER — NAPROXEN 500 MG PO TABS
ORAL_TABLET | ORAL | 0 refills | Status: DC
Start: 2016-06-05 — End: 2017-02-14

## 2016-06-05 NOTE — Patient Instructions (Signed)
I think this is predominantly still golfer's elbow - treat with naprosyn 500mg  twice daily with food for 1 week then as needed, 2 wks light duty at work to give arm a rest, continue exercises previously provided.  Update [place] in 2 weeks if no improvement.

## 2016-06-05 NOTE — Progress Notes (Signed)
BP 124/84   Pulse 86   Temp 98.3 F (36.8 C) (Oral)   Wt 183 lb 4 oz (83.1 kg)   LMP 02/14/2005 (Approximate)   BMI 31.45 kg/m    CC: recheck elbow pain Subjective:    Patient ID: Sabrina Wright, female    DOB: Mar 25, 1961, 55 y.o.   MRN: [id number]  HPI: Sabrina Wright is a 55 y.o. female presenting on 06/05/2016 for Follow-up (recheck elbow; some better after injections-but now hurting again after returning to work)   Seen by Dr Lorelei Pont earlier this month - his notes were reviewed. 15mo h/o L elbow pain. Dx with L arm medial epicondylitis and ulnar neuropathy, treated with rehab and epicondylar injection (lidocaine/depomedrol). This helped for 3-4 days then return to work with repetitive motions - and pain quickly returned. Describes sharp pains inner elbow. Some hand numbness/tingling. No electrical shock pain. Plan was to consider EMG/NCS if no improvement noted. She has also been doing recommended exercises. She is also taking ibuprofen 600mg  several times a day.   She does work at [employer] as Scientist, water quality but also in office - has very low computer which aggravates pain.   Relevant past medical, surgical, family and social history reviewed and updated as indicated. Interim medical history since our last visit reviewed. Allergies and medications reviewed and updated. Current Outpatient Prescriptions on File Prior to Visit  Medication Sig  . amitriptyline (ELAVIL) 100 MG tablet Take 1 tablet (100 mg total) by mouth at bedtime.  . Cholecalciferol (VITAMIN D) 2000 units CAPS Take 2 capsules by mouth daily.  . clobetasol cream (TEMOVATE) AB-123456789 % APPLY 1 APPLICATION TOPICALLY 2 (TWO) TIMES DAILY. APPLY TO AFFECTED AREA NO MORE THAN 2 WEEKS AT A TIME+  . ibandronate (BONIVA) 150 MG tablet Take 1 tablet (150 mg total) by mouth every 30 (thirty) days. Take in the morning with a full glass of water, on an empty stomach  . ibuprofen (ADVIL,MOTRIN) 200 MG tablet Take 400 mg by mouth every 6 (six)  hours as needed (pain). Reported on 02/27/2016  . Multiple Vitamin (MULTIVITAMIN WITH MINERALS) TABS tablet Take 1 tablet by mouth at bedtime.  . sertraline (ZOLOFT) 50 MG tablet Take 1 tablet (50 mg total) by mouth daily.   No current facility-administered medications on file prior to visit.     Review of Systems Per HPI unless specifically indicated in ROS section     Objective:    BP 124/84   Pulse 86   Temp 98.3 F (36.8 C) (Oral)   Wt 183 lb 4 oz (83.1 kg)   LMP 02/14/2005 (Approximate)   BMI 31.45 kg/m   Wt Readings from Last 3 Encounters:  06/05/16 183 lb 4 oz (83.1 kg)  05/09/16 181 lb 12 oz (82.4 kg)  04/23/16 180 lb 8 oz (81.9 kg)    Physical Exam  Constitutional: She appears well-developed and well-nourished. No distress.  Musculoskeletal: She exhibits no edema.  R elbow WNL L elbow FROM Point tender to palpation at medial epicondyle, mild discomfort to palpation at ulnar nerve  Neurological:  Strength/sensation intact today BUE  Skin: Skin is warm and dry. No rash noted.  Psychiatric: She has a normal mood and affect.  Nursing note and vitals reviewed.     Assessment & Plan:   Problem List Items Addressed This Visit    Medial epicondylitis of left elbow - Primary    Persistent over last 2 months despite conservative treatment of rehab and steroid  injection earlier this month by Dr Lorelei Pont.  I will treat today with naprosyn course (pt aware to stop ibuprofen), continue golfer's elbow rehab exercises, start elbow strap use, and will write letter for work to avoid heavy lifting and repetitive motions.       Relevant Medications   naproxen (NAPROSYN) 500 MG tablet    Other Visit Diagnoses   None.      Follow up plan: Return if symptoms worsen or fail to improve.  [name], MD

## 2016-06-05 NOTE — Progress Notes (Signed)
Pre visit review using our clinic review tool, if applicable. No additional management support is needed unless otherwise documented below in the visit note. 

## 2016-06-05 NOTE — Assessment & Plan Note (Addendum)
Persistent over last 2 months despite conservative treatment of rehab and steroid injection earlier this month by Dr Lorelei Pont.  I will treat today with naprosyn course (pt aware to stop ibuprofen), continue golfer's elbow rehab exercises, start elbow strap use, and will write letter for work to avoid heavy lifting and repetitive motions.

## 2016-06-23 ENCOUNTER — Emergency Department
Admission: EM | Admit: 2016-06-23 | Discharge: 2016-06-23 | Disposition: A | Discharge status: Home / Self Care | Payer: BLUE CROSS/BLUE SHIELD | Attending: Emergency Medicine | Admitting: Emergency Medicine

## 2016-06-23 DIAGNOSIS — Z87891 Personal history of nicotine dependence: Secondary | ICD-10-CM | POA: Diagnosis not present

## 2016-06-23 DIAGNOSIS — Z853 Personal history of malignant neoplasm of breast: Secondary | ICD-10-CM | POA: Diagnosis not present

## 2016-06-23 DIAGNOSIS — Z79899 Other long term (current) drug therapy: Secondary | ICD-10-CM | POA: Diagnosis not present

## 2016-06-23 DIAGNOSIS — M79672 Pain in left foot: Secondary | ICD-10-CM | POA: Diagnosis not present

## 2016-06-23 MED ORDER — PREDNISONE 10 MG PO TABS: 10.0000 mg | ORAL_TABLET | Freq: Every day | ORAL | 0 refills | Status: DC

## 2016-06-23 MED ORDER — PREDNISONE 20 MG PO TABS
60.0000 mg | ORAL_TABLET | Freq: Once | ORAL | Status: AC
  Administered 2016-06-23: 60 mg via ORAL
  Filled 2016-06-23: qty 3

## 2016-06-23 NOTE — Discharge Instructions (Signed)
Please rest elevate and ice the lower extremity. Take medications as prescribed. Follow-up with podiatry if no improvement in 5-6 days. Avoid shoes because contact with the top of the foot.

## 2016-06-23 NOTE — ED Triage Notes (Addendum)
Pt reports pain and swelling in top of left foot for past 2 days. Denies injury. States she is on her feet a lot at work about 7-8 hours pain worse by end of day
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
## 2016-07-17 ENCOUNTER — Other Ambulatory Visit: Payer: Self-pay | Admitting: Family Medicine

## 2016-08-08 ENCOUNTER — Other Ambulatory Visit: Payer: Self-pay | Admitting: Family Medicine

## 2016-08-08 DIAGNOSIS — E559 Vitamin D deficiency, unspecified: Secondary | ICD-10-CM

## 2016-08-08 DIAGNOSIS — E785 Hyperlipidemia, unspecified: Secondary | ICD-10-CM

## 2016-08-09 ENCOUNTER — Other Ambulatory Visit (INDEPENDENT_AMBULATORY_CARE_PROVIDER_SITE_OTHER): Payer: BLUE CROSS/BLUE SHIELD

## 2016-08-09 DIAGNOSIS — E785 Hyperlipidemia, unspecified: Secondary | ICD-10-CM

## 2016-08-09 DIAGNOSIS — E559 Vitamin D deficiency, unspecified: Secondary | ICD-10-CM

## 2016-08-09 LAB — LIPID PANEL
CHOLESTEROL: 183 mg/dL (ref 0–200)
HDL: 54.4 mg/dL (ref >39.00)
LDL Cholesterol: 110 mg/dL — ABNORMAL HIGH (ref 0–99)
NonHDL: 128.89
TRIGLYCERIDES: 93 mg/dL (ref 0.0–149.0)
Total CHOL/HDL Ratio: 3
VLDL: 18.6 mg/dL (ref 0.0–40.0)

## 2016-08-09 LAB — BASIC METABOLIC PANEL
BUN: 14 mg/dL (ref 6–23)
CALCIUM: 10.2 mg/dL (ref 8.4–10.5)
CHLORIDE: 103 meq/L (ref 96–112)
CO2: 32 meq/L (ref 19–32)
CREATININE: 0.94 mg/dL (ref 0.40–1.20)
GFR: 65.59 mL/min (ref >60.00)
GLUCOSE: 91 mg/dL (ref 70–99)
Potassium: 4.5 mEq/L (ref 3.5–5.1)
Sodium: 141 mEq/L (ref 135–145)

## 2016-08-09 LAB — VITAMIN D 25 HYDROXY (VIT D DEFICIENCY, FRACTURES): VITD: 16.75 ng/mL — AB (ref 30.00–100.00)

## 2016-08-15 ENCOUNTER — Encounter: Payer: Self-pay | Admitting: Family Medicine

## 2016-08-15 ENCOUNTER — Ambulatory Visit (INDEPENDENT_AMBULATORY_CARE_PROVIDER_SITE_OTHER): Payer: BLUE CROSS/BLUE SHIELD | Admitting: Family Medicine

## 2016-08-15 VITALS — BP 126/74 | HR 84 | Temp 98.2°F | Wt 181.5 lb

## 2016-08-15 DIAGNOSIS — G5690 Unspecified mononeuropathy of unspecified upper limb: Secondary | ICD-10-CM | POA: Diagnosis not present

## 2016-08-15 DIAGNOSIS — F411 Generalized anxiety disorder: Secondary | ICD-10-CM

## 2016-08-15 DIAGNOSIS — M810 Age-related osteoporosis without current pathological fracture: Secondary | ICD-10-CM

## 2016-08-15 DIAGNOSIS — Z87891 Personal history of nicotine dependence: Secondary | ICD-10-CM | POA: Diagnosis not present

## 2016-08-15 DIAGNOSIS — M7702 Medial epicondylitis, left elbow: Secondary | ICD-10-CM

## 2016-08-15 DIAGNOSIS — E559 Vitamin D deficiency, unspecified: Secondary | ICD-10-CM

## 2016-08-15 DIAGNOSIS — M792 Neuralgia and neuritis, unspecified: Secondary | ICD-10-CM

## 2016-08-15 DIAGNOSIS — Z23 Encounter for immunization: Secondary | ICD-10-CM

## 2016-08-15 MED ORDER — CALCIUM CARB-CHOLECALCIFEROL 600-800 MG-UNIT PO TABS: 2.0000 | ORAL_TABLET | Freq: Every day | ORAL | Status: AC

## 2016-08-15 NOTE — Assessment & Plan Note (Signed)
Significant improvement with sertraline on board - continue.

## 2016-08-15 NOTE — Assessment & Plan Note (Signed)
Chronic over last 4 months, failed conservative management as well as steroid injection. ?of ulnar neuropathy - will check NCS.

## 2016-08-15 NOTE — Assessment & Plan Note (Addendum)
Presumed sequela of radiation therapy for L breast cancer. ?ulnar neuropathy component Unable to afford lyrica. Continues amitriptyline 100mg  nightly.

## 2016-08-15 NOTE — Progress Notes (Signed)
Pre visit review using our clinic review tool, if applicable. No additional management support is needed unless otherwise documented below in the visit note. 

## 2016-08-15 NOTE — Assessment & Plan Note (Signed)
More complaint with boniva monthly. Also compliant with calcium/vit D 600/800 two tablets daily.

## 2016-08-15 NOTE — Progress Notes (Signed)
BP 126/74   Pulse 84   Temp 98.2 F (36.8 C) (Oral)   Wt 181 lb 8 oz (82.3 kg)   LMP 02/14/2005 (Approximate)   BMI 31.15 kg/m    CC: 32mo f/u visit Subjective:    Patient ID: Sabrina Wright, female    DOB: May 28, 1961, 55 y.o.   MRN: [id number]  HPI: Sabrina Wright is a 56 y.o. female presenting on 08/15/2016 for Follow-up   Ongoing chronic elbow pain from presumed medial epicondylitis + ulnar neuropathy - failed conservative treatment even with steroid injection into L medial epicondyle by Dr Lorelei Pont. Written for light duty at work but states has had trouble proceeding with this. Pt is R handed.   Quit smoking 12/2015, remains abstinent. For increased anxiety, last visit we started sertraline 50mg  daily. Feels less anxious, doesn't cry, feels much better, would like to continue at this dose.   Neuropathic pain of L arm after radiation therapy for breast cancer - last visit we started lyrica and tapered off amitriptyline. Gabapentin previously didn't help. Could not afford lyrica, so amitriptyline was continued.   Osteoporosis - trial boniva last visit due to forgetful with weekly dosing. She reports complaince wit hvit D 4000 IU daily.   Relevant past medical, surgical, family and social history reviewed and updated as indicated. Interim medical history since our last visit reviewed. Allergies and medications reviewed and updated. Current Outpatient Prescriptions on File Prior to Visit  Medication Sig  . amitriptyline (ELAVIL) 100 MG tablet take 1 tablet by mouth at bedtime  . clobetasol cream (TEMOVATE) AB-123456789 % APPLY 1 APPLICATION TOPICALLY 2 (TWO) TIMES DAILY. APPLY TO AFFECTED AREA NO MORE THAN 2 WEEKS AT A TIME+  . ibandronate (BONIVA) 150 MG tablet Take 1 tablet (150 mg total) by mouth every 30 (thirty) days. Take in the morning with a full glass of water, on an empty stomach  . ibuprofen (ADVIL,MOTRIN) 200 MG tablet Take 400 mg by mouth every 6 (six) hours as needed (pain).  Reported on 02/27/2016  . Multiple Vitamin (MULTIVITAMIN WITH MINERALS) TABS tablet Take 1 tablet by mouth at bedtime.  . naproxen (NAPROSYN) 500 MG tablet Take one po bid x 1 week then prn pain, take with food  . sertraline (ZOLOFT) 50 MG tablet take 1 tablet by mouth once daily   No current facility-administered medications on file prior to visit.     Review of Systems Per HPI unless specifically indicated in ROS section     Objective:    BP 126/74   Pulse 84   Temp 98.2 F (36.8 C) (Oral)   Wt 181 lb 8 oz (82.3 kg)   LMP 02/14/2005 (Approximate)   BMI 31.15 kg/m   Wt Readings from Last 3 Encounters:  08/15/16 181 lb 8 oz (82.3 kg)  06/23/16 175 lb (79.4 kg)  06/05/16 183 lb 4 oz (83.1 kg)    Physical Exam  Constitutional: She appears well-developed and well-nourished. No distress.  HENT:  Mouth/Throat: Oropharynx is clear and moist. No oropharyngeal exudate.  Cardiovascular: Normal rate, regular rhythm, normal heart sounds and intact distal pulses.   No murmur heard. Pulmonary/Chest: Effort normal and breath sounds normal. No respiratory distress. She has no wheezes. She has no rales.  Musculoskeletal: She exhibits no edema.  Point tender at L medial epicondyle Mild discomfort with compression of ulnar nerve just proximal to medial epicondyle.  Skin: Skin is warm and dry. No rash noted. No erythema.  Psychiatric: She has  a normal mood and affect.  Nursing note and vitals reviewed.  Results for orders placed or performed in visit on 08/09/16  Lipid panel  Result Value Ref Range   Cholesterol 183 0 - 200 mg/dL   Triglycerides 93.0 0.0 - 149.0 mg/dL   HDL 54.40 >39.00 mg/dL   VLDL 18.6 0.0 - 40.0 mg/dL   LDL Cholesterol 110 (H) 0 - 99 mg/dL   Total CHOL/HDL Ratio 3    NonHDL [id number]   Basic metabolic panel  Result Value Ref Range   Sodium 141 135 - 145 mEq/L   Potassium 4.5 3.5 - 5.1 mEq/L   Chloride 103 96 - 112 mEq/L   CO2 32 19 - 32 mEq/L   Glucose, Bld 91 70  - 99 mg/dL   BUN 14 6 - 23 mg/dL   Creatinine, Ser 0.94 0.40 - 1.20 mg/dL   Calcium 10.2 8.4 - 10.5 mg/dL   GFR 65.59 >60.00 mL/min  VITAMIN D 25 Hydroxy (Vit-D Deficiency, Fractures)  Result Value Ref Range   VITD 16.75 (L) 30.00 - 100.00 ng/mL      Assessment & Plan:   Problem List Items Addressed This Visit    Anxiety state    Significant improvement with sertraline on board - continue.       Ex-smoker    Congratulated on continued abstinence      Medial epicondylitis of left elbow - Primary    Chronic over last 4 months, failed conservative management as well as steroid injection. ?of ulnar neuropathy - will check NCS.       Relevant Orders   Ambulatory referral to Neurology   Neuropathic pain, arm    Presumed sequela of radiation therapy for L breast cancer. ?ulnar neuropathy component Unable to afford lyrica. Continues amitriptyline 100mg  nightly.       Relevant Orders   Ambulatory referral to Neurology   Osteoporosis    More complaint with boniva monthly. Also compliant with calcium/vit D 600/800 two tablets daily.       Relevant Medications   Calcium Carb-Cholecalciferol (CALCIUM 600/VITAMIN D3) 600-800 MG-UNIT TABS   Vitamin D deficiency    Remains deficient despite vit D 1800 IU daily - will recommend additional D at f/u CPE.       Other Visit Diagnoses    Need for influenza vaccination       Relevant Orders   Flu vaccine, recombinant, quadrivalent, inj (Flublok quad egg free)       Follow up plan: Return in about 6 months (around 02/13/2017) for annual exam, prior fasting for blood work.  [name], MD

## 2016-08-15 NOTE — Assessment & Plan Note (Signed)
Congratulated on continued abstinence 

## 2016-08-15 NOTE — Assessment & Plan Note (Addendum)
Remains deficient despite vit D 1800 IU daily - will recommend additional D at f/u CPE.

## 2016-08-15 NOTE — Patient Instructions (Addendum)
Flu shot today. We will refer you for nerve conduction study of left arm.  Return in 6 months for physical.

## 2016-08-19 DIAGNOSIS — M79602 Pain in left arm: Secondary | ICD-10-CM | POA: Diagnosis not present

## 2016-08-24 ENCOUNTER — Telehealth: Payer: Self-pay | Admitting: Family Medicine

## 2016-08-24 DIAGNOSIS — M7702 Medial epicondylitis, left elbow: Secondary | ICD-10-CM

## 2016-08-24 NOTE — Telephone Encounter (Signed)
plz notify nerve conduction study returned normal - no signs of nerve damage to bilateral arms. Would offer return to sports medicine vs ortho referral for further evaluation.

## 2016-08-26 NOTE — Telephone Encounter (Signed)
Patient notified. She said she would prefer ortho. Also she said she now has some painful swelling to the left elbow area. She denies redness or warmth. She says its typically more swollen after work and feels like a knot.

## 2016-08-27 NOTE — Telephone Encounter (Signed)
Ortho referral placed

## 2016-09-03 NOTE — Telephone Encounter (Signed)
Ortho Appt made on 09/05/16 with Dr Mack Guise, and patient aware.

## 2016-09-05 DIAGNOSIS — S53402A Unspecified sprain of left elbow, initial encounter: Secondary | ICD-10-CM | POA: Diagnosis not present

## 2016-09-30 DIAGNOSIS — S53402A Unspecified sprain of left elbow, initial encounter: Secondary | ICD-10-CM | POA: Diagnosis not present

## 2016-09-30 DIAGNOSIS — M25522 Pain in left elbow: Secondary | ICD-10-CM | POA: Diagnosis not present

## 2016-10-11 DIAGNOSIS — M25522 Pain in left elbow: Secondary | ICD-10-CM | POA: Diagnosis not present

## 2016-11-05 ENCOUNTER — Other Ambulatory Visit: Payer: Self-pay | Admitting: Family Medicine

## 2016-12-20 ENCOUNTER — Other Ambulatory Visit: Payer: Self-pay | Admitting: Family Medicine

## 2016-12-20 DIAGNOSIS — Z1231 Encounter for screening mammogram for malignant neoplasm of breast: Secondary | ICD-10-CM

## 2017-01-20 ENCOUNTER — Ambulatory Visit
Admission: RE | Admit: 2017-01-20 | Discharge: 2017-01-20 | Disposition: A | Discharge status: Home / Self Care | Payer: BLUE CROSS/BLUE SHIELD | Source: Ambulatory Visit | Attending: Family Medicine | Admitting: Family Medicine

## 2017-01-20 DIAGNOSIS — Z1231 Encounter for screening mammogram for malignant neoplasm of breast: Secondary | ICD-10-CM | POA: Diagnosis not present

## 2017-01-20 LAB — HM MAMMOGRAPHY

## 2017-01-21 ENCOUNTER — Encounter: Payer: Self-pay | Admitting: *Deleted

## 2017-02-09 ENCOUNTER — Other Ambulatory Visit: Payer: Self-pay | Admitting: Family Medicine

## 2017-02-09 DIAGNOSIS — M810 Age-related osteoporosis without current pathological fracture: Secondary | ICD-10-CM

## 2017-02-09 DIAGNOSIS — M792 Neuralgia and neuritis, unspecified: Secondary | ICD-10-CM

## 2017-02-09 DIAGNOSIS — E559 Vitamin D deficiency, unspecified: Secondary | ICD-10-CM

## 2017-02-12 ENCOUNTER — Other Ambulatory Visit (INDEPENDENT_AMBULATORY_CARE_PROVIDER_SITE_OTHER): Payer: BLUE CROSS/BLUE SHIELD

## 2017-02-12 DIAGNOSIS — G5690 Unspecified mononeuropathy of unspecified upper limb: Secondary | ICD-10-CM

## 2017-02-12 DIAGNOSIS — E559 Vitamin D deficiency, unspecified: Secondary | ICD-10-CM

## 2017-02-12 DIAGNOSIS — M810 Age-related osteoporosis without current pathological fracture: Secondary | ICD-10-CM

## 2017-02-12 DIAGNOSIS — M792 Neuralgia and neuritis, unspecified: Secondary | ICD-10-CM

## 2017-02-12 LAB — BASIC METABOLIC PANEL
BUN: 13 mg/dL (ref 6–23)
CALCIUM: 9.7 mg/dL (ref 8.4–10.5)
CO2: 30 meq/L (ref 19–32)
Chloride: 101 mEq/L (ref 96–112)
Creatinine, Ser: 0.95 mg/dL (ref 0.40–1.20)
GFR: 64.67 mL/min (ref >60.00)
GLUCOSE: 97 mg/dL (ref 70–99)
Potassium: 4.4 mEq/L (ref 3.5–5.1)
SODIUM: 138 meq/L (ref 135–145)

## 2017-02-12 LAB — VITAMIN B12: Vitamin B-12: 198 pg/mL — ABNORMAL LOW (ref 211–911)

## 2017-02-12 LAB — TSH: TSH: 2.74 u[IU]/mL (ref 0.35–4.50)

## 2017-02-12 LAB — VITAMIN D 25 HYDROXY (VIT D DEFICIENCY, FRACTURES): VITD: 19.4 ng/mL — ABNORMAL LOW (ref 30.00–100.00)

## 2017-02-14 ENCOUNTER — Encounter: Payer: Self-pay | Admitting: *Deleted

## 2017-02-14 ENCOUNTER — Ambulatory Visit (INDEPENDENT_AMBULATORY_CARE_PROVIDER_SITE_OTHER): Payer: BLUE CROSS/BLUE SHIELD | Admitting: Family Medicine

## 2017-02-14 ENCOUNTER — Encounter: Payer: Self-pay | Admitting: Family Medicine

## 2017-02-14 VITALS — BP 116/60 | HR 60 | Temp 98.7°F | Ht 64.0 in | Wt 186.2 lb

## 2017-02-14 DIAGNOSIS — Z853 Personal history of malignant neoplasm of breast: Secondary | ICD-10-CM

## 2017-02-14 DIAGNOSIS — M7702 Medial epicondylitis, left elbow: Secondary | ICD-10-CM

## 2017-02-14 DIAGNOSIS — M810 Age-related osteoporosis without current pathological fracture: Secondary | ICD-10-CM

## 2017-02-14 DIAGNOSIS — E559 Vitamin D deficiency, unspecified: Secondary | ICD-10-CM

## 2017-02-14 DIAGNOSIS — F411 Generalized anxiety disorder: Secondary | ICD-10-CM

## 2017-02-14 DIAGNOSIS — E538 Deficiency of other specified B group vitamins: Secondary | ICD-10-CM

## 2017-02-14 DIAGNOSIS — Z87891 Personal history of nicotine dependence: Secondary | ICD-10-CM

## 2017-02-14 DIAGNOSIS — Z0000 Encounter for general adult medical examination without abnormal findings: Secondary | ICD-10-CM | POA: Diagnosis not present

## 2017-02-14 MED ORDER — VITAMIN D3 1.25 MG (50000 UT) PO TABS: 1.0000 | ORAL_TABLET | ORAL | 1 refills | Status: DC

## 2017-02-14 MED ORDER — CYANOCOBALAMIN 1000 MCG/ML IJ SOLN
1000.0000 ug | Freq: Once | INTRAMUSCULAR | Status: AC
  Administered 2017-02-14: 1000 ug via INTRAMUSCULAR

## 2017-02-14 MED ORDER — MELOXICAM 7.5 MG PO TABS: 7.5000 mg | ORAL_TABLET | Freq: Every day | ORAL | 3 refills | Status: DC

## 2017-02-14 NOTE — Patient Instructions (Addendum)
b12 shot today - then start 1075mg daily B12 over the counter. We will schedule you for repeat bone density scan.  We will look into boniva infusion at short stay every 3 months.  Start vitamin D 50,000 units weekly for 3 months then continue daily replacement.  Continue meloxicam 7.'5mg'$  daily. Let [place] if any GI upset with this.   Health Maintenance, Female Adopting a healthy lifestyle and getting preventive care can go a long way to promote health and wellness. Talk with your health care provider about what schedule of regular examinations is right for you. This is a good chance for you to check in with your provider about disease prevention and staying healthy. In between checkups, there are plenty of things you can do on your own. Experts have done a lot of research about which lifestyle changes and preventive measures are most likely to keep you healthy. Ask your health care provider for more information. Weight and diet Eat a healthy diet  Be sure to include plenty of vegetables, fruits, low-fat dairy products, and lean protein.  Do not eat a lot of foods high in solid fats, added sugars, or salt.  Get regular exercise. This is one of the most important things you can do for your health.  Most adults should exercise for at least 150 minutes each week. The exercise should increase your heart rate and make you sweat (moderate-intensity exercise).  Most adults should also do strengthening exercises at least twice a week. This is in addition to the moderate-intensity exercise. Maintain a healthy weight  Body mass index (BMI) is a measurement that can be used to identify possible weight problems. It estimates body fat based on height and weight. Your health care provider can help determine your BMI and help you achieve or maintain a healthy weight.  For females 222years of age and older:  A BMI below 18.5 is considered underweight.  A BMI of 18.5 to 24.9 is normal.  A BMI of 25 to  29.9 is considered overweight.  A BMI of 30 and above is considered obese. Watch levels of cholesterol and blood lipids  You should start having your blood tested for lipids and cholesterol at 56years of age, then have this test every 5 years.  You may need to have your cholesterol levels checked more often if:  Your lipid or cholesterol levels are high.  You are older than 56years of age.  You are at high risk for heart disease. Cancer screening Lung Cancer  Lung cancer screening is recommended for adults 522816years old who are at high risk for lung cancer because of a history of smoking.  A yearly low-dose CT scan of the lungs is recommended for people who:  Currently smoke.  Have quit within the past 15 years.  Have at least a 30-pack-year history of smoking. A pack year is smoking an average of one pack of cigarettes a day for 1 year.  Yearly screening should continue until it has been 15 years since you quit.  Yearly screening should stop if you develop a health problem that would prevent you from having lung cancer treatment. Breast Cancer  Practice breast self-awareness. This means understanding how your breasts normally appear and feel.  It also means doing regular breast self-exams. Let your health care provider know about any changes, no matter how small.  If you are in your 20s or 30s, you should have a clinical breast exam (CBE) by a  health care provider every 1-3 years as part of a regular health exam.  If you are 26 or older, have a CBE every year. Also consider having a breast X-ray (mammogram) every year.  If you have a family history of breast cancer, talk to your health care provider about genetic screening.  If you are at high risk for breast cancer, talk to your health care provider about having an MRI and a mammogram every year.  Breast cancer gene (BRCA) assessment is recommended for women who have family members with BRCA-related cancers.  BRCA-related cancers include:  Breast.  Ovarian.  Tubal.  Peritoneal cancers.  Results of the assessment will determine the need for genetic counseling and BRCA1 and BRCA2 testing. Cervical Cancer  Your health care provider may recommend that you be screened regularly for cancer of the pelvic organs (ovaries, uterus, and vagina). This screening involves a pelvic examination, including checking for microscopic changes to the surface of your cervix (Pap test). You may be encouraged to have this screening done every 3 years, beginning at age 37.  For women ages 3-65, health care providers may recommend pelvic exams and Pap testing every 3 years, or they may recommend the Pap and pelvic exam, combined with testing for human papilloma virus (HPV), every 5 years. Some types of HPV increase your risk of cervical cancer. Testing for HPV may also be done on women of any age with unclear Pap test results.  Other health care providers may not recommend any screening for nonpregnant women who are considered low risk for pelvic cancer and who do not have symptoms. Ask your health care provider if a screening pelvic exam is right for you.  If you have had past treatment for cervical cancer or a condition that could lead to cancer, you need Pap tests and screening for cancer for at least 20 years after your treatment. If Pap tests have been discontinued, your risk factors (such as having a new sexual partner) need to be reassessed to determine if screening should resume. Some women have medical problems that increase the chance of getting cervical cancer. In these cases, your health care provider may recommend more frequent screening and Pap tests. Colorectal Cancer  This type of cancer can be detected and often prevented.  Routine colorectal cancer screening usually begins at 56 years of age and continues through 56 years of age.  Your health care provider may recommend screening at an earlier age if you  have risk factors for colon cancer.  Your health care provider may also recommend using home test kits to check for hidden blood in the stool.  A small camera at the end of a tube can be used to examine your colon directly (sigmoidoscopy or colonoscopy). This is done to check for the earliest forms of colorectal cancer.  Routine screening usually begins at age 42.  Direct examination of the colon should be repeated every 5-10 years through 56 years of age. However, you may need to be screened more often if early forms of precancerous polyps or small growths are found. Skin Cancer  Check your skin from head to toe regularly.  Tell your health care provider about any new moles or changes in moles, especially if there is a change in a mole's shape or color.  Also tell your health care provider if you have a mole that is larger than the size of a pencil eraser.  Always use sunscreen. Apply sunscreen liberally and repeatedly throughout the day.  Protect yourself by wearing long sleeves, pants, a wide-brimmed hat, and sunglasses whenever you are outside. Heart disease, diabetes, and high blood pressure  High blood pressure causes heart disease and increases the risk of stroke. High blood pressure is more likely to develop in:  People who have blood pressure in the high end of the normal range (130-139/85-89 mm Hg).  People who are overweight or obese.  People who are African American.  If you are 66-41 years of age, have your blood pressure checked every 3-5 years. If you are 45 years of age or older, have your blood pressure checked every year. You should have your blood pressure measured twice-once when you are at a hospital or clinic, and once when you are not at a hospital or clinic. Record the average of the two measurements. To check your blood pressure when you are not at a hospital or clinic, you can use:  An automated blood pressure machine at a pharmacy.  A home blood pressure  monitor.  If you are between 65 years and 88 years old, ask your health care provider if you should take aspirin to prevent strokes.  Have regular diabetes screenings. This involves taking a blood sample to check your fasting blood sugar level.  If you are at a normal weight and have a low risk for diabetes, have this test once every three years after 56 years of age.  If you are overweight and have a high risk for diabetes, consider being tested at a younger age or more often. Preventing infection Hepatitis B  If you have a higher risk for hepatitis B, you should be screened for this virus. You are considered at high risk for hepatitis B if:  You were born in a country where hepatitis B is common. Ask your health care provider which countries are considered high risk.  Your parents were born in a high-risk country, and you have not been immunized against hepatitis B (hepatitis B vaccine).  You have HIV or AIDS.  You use needles to inject street drugs.  You live with someone who has hepatitis B.  You have had sex with someone who has hepatitis B.  You get hemodialysis treatment.  You take certain medicines for conditions, including cancer, organ transplantation, and autoimmune conditions. Hepatitis C  Blood testing is recommended for:  Everyone born from [birth year] through 1965.  Anyone with known risk factors for hepatitis C. Sexually transmitted infections (STIs)  You should be screened for sexually transmitted infections (STIs) including gonorrhea and chlamydia if:  You are sexually active and are younger than 56 years of age.  You are older than 56 years of age and your health care provider tells you that you are at risk for this type of infection.  Your sexual activity has changed since you were last screened and you are at an increased risk for chlamydia or gonorrhea. Ask your health care provider if you are at risk.  If you do not have HIV, but are at risk, it may be  recommended that you take a prescription medicine daily to prevent HIV infection. This is called pre-exposure prophylaxis (PrEP). You are considered at risk if:  You are sexually active and do not regularly use condoms or know the HIV status of your partner(s).  You take drugs by injection.  You are sexually active with a partner who has HIV. Talk with your health care provider about whether you are at high risk of being infected with HIV.  If you choose to begin PrEP, you should first be tested for HIV. You should then be tested every 3 months for as long as you are taking PrEP. Pregnancy  If you are premenopausal and you may become pregnant, ask your health care provider about preconception counseling.  If you may become pregnant, take 400 to 800 micrograms (mcg) of folic acid every day.  If you want to prevent pregnancy, talk to your health care provider about birth control (contraception). Osteoporosis and menopause  Osteoporosis is a disease in which the bones lose minerals and strength with aging. This can result in serious bone fractures. Your risk for osteoporosis can be identified using a bone density scan.  If you are 47 years of age or older, or if you are at risk for osteoporosis and fractures, ask your health care provider if you should be screened.  Ask your health care provider whether you should take a calcium or vitamin D supplement to lower your risk for osteoporosis.  Menopause may have certain physical symptoms and risks.  Hormone replacement therapy may reduce some of these symptoms and risks. Talk to your health care provider about whether hormone replacement therapy is right for you. Follow these instructions at home:  Schedule regular health, dental, and eye exams.  Stay current with your immunizations.  Do not use any tobacco products including cigarettes, chewing tobacco, or electronic cigarettes.  If you are pregnant, do not drink alcohol.  If you are  breastfeeding, limit how much and how often you drink alcohol.  Limit alcohol intake to no more than 1 drink per day for nonpregnant women. One drink equals 12 ounces of beer, 5 ounces of wine, or 1 ounces of hard liquor.  Do not use street drugs.  Do not share needles.  Ask your health care provider for help if you need support or information about quitting drugs.  Tell your health care provider if you often feel depressed.  Tell your health care provider if you have ever been abused or do not feel safe at home. This information is not intended to replace advice given to you by your health care provider. Make sure you discuss any questions you have with your health care provider. Document Released: 05/06/2011 Document Revised: 03/28/2016 Document Reviewed: 07/25/2015 Elsevier Interactive Patient Education  2017 [employer].

## 2017-02-14 NOTE — Assessment & Plan Note (Signed)
Preventative protocols reviewed and updated unless pt declined. Discussed healthy diet and lifestyle.  

## 2017-02-14 NOTE — Assessment & Plan Note (Signed)
Reviewed ongoing work stress. Continue sertraline.

## 2017-02-14 NOTE — Assessment & Plan Note (Signed)
Update DEXA. Has been forgetting PO boniva. Will try and schedule IV boniva for patient - she thinks she would do better with med compliance with this.

## 2017-02-14 NOTE — Progress Notes (Signed)
BP 116/60   Pulse 60   Temp 98.7 F (37.1 C) (Oral)   Ht 5\' 4"  (1.626 m)   Wt 186 lb 4 oz (84.5 kg)   LMP 02/14/2005 (Approximate)   BMI 31.97 kg/m    CC: CPE Subjective:    Patient ID: [name], female    DOB: February 17, 1961, 56 y.o.   MRN: [id number]  HPI: SHAMINA ETHERIDGE is a 56 y.o. female presenting on 02/14/2017 for Annual Exam   Chronic medial epicondylitis - referred to ortho and completed physical therapy course. Most benefit with addition of meloxicam 7.5mg  daily - told arthritis related. Off amitriptyline.   Osteoporosis DEXA 01/2014 - on monthly boniva and vit D 4000 IU daily. Continues taking boniva but struggles remembering to take pill.   Preventative: COLONOSCOPY 2017 TAx2, diverticulosis, rpt 5 yrs (Nandigam) Mammogram normal 01/2017 - h/o breast cancer Well woman exam - normal 2016. h/o abnl paps in past, 3 latest were normal. Rpt around 2019.  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 Tdap 02/2015  Pneumovax 02/2015 Seat belt use discussed Sunscreen use discussed. No changing moles on skin. Ex smoker - quit last year. ~20 PY smoker Alcohol - rare  Lives with husband and 2 daughters, 2 dogs and 2 cats  Occupation: Animal nutritionist at [employer]  Edu: HS  Activity: walks daily Diet: good water, fruits/vegetables daily, red meat 2x/wk, fish rarely.  Relevant past medical, surgical, family and social history reviewed and updated as indicated. Interim medical history since our last visit reviewed. Allergies and medications reviewed and updated. Outpatient Medications Prior to Visit  Medication Sig Dispense Refill  . Calcium Carb-Cholecalciferol (CALCIUM 600/VITAMIN D3) 600-800 MG-UNIT TABS Take 2 tablets by mouth daily. 60 tablet   . clobetasol cream (TEMOVATE) 2.44 % APPLY 1 APPLICATION TOPICALLY 2 (TWO) TIMES DAILY. APPLY TO AFFECTED AREA NO MORE THAN 2 WEEKS AT A TIME+ 30 g 0  . ibandronate  (BONIVA) 150 MG tablet Take 1 tablet (150 mg total) by mouth every 30 (thirty) days. Take in the morning with a full glass of water, on an empty stomach 3 tablet 3  . Multiple Vitamin (MULTIVITAMIN WITH MINERALS) TABS tablet Take 1 tablet by mouth at bedtime.    . sertraline (ZOLOFT) 50 MG tablet take 1 tablet by mouth once daily 30 tablet 3  . amitriptyline (ELAVIL) 100 MG tablet take 1 tablet by mouth at bedtime 30 tablet 11  . naproxen (NAPROSYN) 500 MG tablet Take one po bid x 1 week then prn pain, take with food 40 tablet 0  . ibuprofen (ADVIL,MOTRIN) 200 MG tablet Take 400 mg by mouth every 6 (six) hours as needed (pain). Reported on 02/27/2016     No facility-administered medications prior to visit.      Per HPI unless specifically indicated in ROS section below Review of Systems  Constitutional: Negative for activity change, appetite change, chills, fatigue, fever and unexpected weight change.  HENT: Negative for hearing loss.   Eyes: Negative for visual disturbance.  Respiratory: Negative for cough, chest tightness, shortness of breath and wheezing.   Cardiovascular: Negative for chest pain, palpitations and leg swelling.  Gastrointestinal: Negative for abdominal distention, abdominal pain, blood in stool, constipation, diarrhea, nausea and vomiting.  Genitourinary: Negative for difficulty urinating and hematuria.  Musculoskeletal: Negative for arthralgias, myalgias and neck pain.  Skin: Negative for rash.  Neurological: Positive for headaches (thinks stress  related). Negative for dizziness, seizures and syncope.  Hematological: Negative for adenopathy. Does not bruise/bleed easily.  Psychiatric/Behavioral: Negative for dysphoric mood. The patient is nervous/anxious.        Objective:    BP 116/60   Pulse 60   Temp 98.7 F (37.1 C) (Oral)   Ht 5\' 4"  (1.626 m)   Wt 186 lb 4 oz (84.5 kg)   LMP 02/14/2005 (Approximate)   BMI 31.97 kg/m   Wt Readings from Last 3 Encounters:    02/14/17 186 lb 4 oz (84.5 kg)  08/15/16 181 lb 8 oz (82.3 kg)  06/23/16 175 lb (79.4 kg)    Physical Exam  Constitutional: She is oriented to person, place, and time. She appears well-developed and well-nourished. No distress.  HENT:  Head: Normocephalic and atraumatic.  Right Ear: Hearing, tympanic membrane, external ear and ear canal normal.  Left Ear: Hearing, tympanic membrane, external ear and ear canal normal.  Nose: Nose normal.  Mouth/Throat: Uvula is midline, oropharynx is clear and moist and mucous membranes are normal. No oropharyngeal exudate, posterior oropharyngeal edema or posterior oropharyngeal erythema.  Eyes: Conjunctivae and EOM are normal. Pupils are equal, round, and reactive to light. No scleral icterus.  Neck: Normal range of motion. Neck supple. No thyromegaly present.  Cardiovascular: Normal rate, regular rhythm, normal heart sounds and intact distal pulses.   No murmur heard. Pulses:      Radial pulses are 2+ on the right side, and 2+ on the left side.  Pulmonary/Chest: Effort normal and breath sounds normal. No respiratory distress. She has no wheezes. She has no rales.  Abdominal: Soft. Bowel sounds are normal. She exhibits no distension and no mass. There is no tenderness. There is no rebound and no guarding.  Musculoskeletal: Normal range of motion. She exhibits no edema.  Lymphadenopathy:    She has no cervical adenopathy.  Neurological: She is alert and oriented to person, place, and time.  CN grossly intact, station and gait intact  Skin: Skin is warm and dry. No rash noted.  Psychiatric: She has a normal mood and affect. Her behavior is normal. Judgment and thought content normal.  Nursing note and vitals reviewed.  Results for orders placed or performed in visit on 02/12/17  Vitamin B12  Result Value Ref Range   Vitamin B-12 198 (L) 211 - 911 pg/mL  VITAMIN D 25 Hydroxy (Vit-D Deficiency, Fractures)  Result Value Ref Range   VITD 19.40 (L)  30.00 - 100.00 ng/mL  Basic metabolic panel  Result Value Ref Range   Sodium 138 135 - 145 mEq/L   Potassium 4.4 3.5 - 5.1 mEq/L   Chloride 101 96 - 112 mEq/L   CO2 30 19 - 32 mEq/L   Glucose, Bld 97 70 - 99 mg/dL   BUN 13 6 - 23 mg/dL   Creatinine, Ser 0.95 0.40 - 1.20 mg/dL   Calcium 9.7 8.4 - 10.5 mg/dL   GFR 64.67 >60.00 mL/min  TSH  Result Value Ref Range   TSH 2.74 0.35 - 4.50 uIU/mL      Assessment & Plan:   Problem List Items Addressed This Visit    Anxiety state    Reviewed ongoing work stress. Continue sertraline.       Ex-smoker    Congratulated on remaining abstinent from smoking.       Health maintenance examination - Primary    Preventative protocols reviewed and updated unless pt declined. Discussed healthy diet and lifestyle.  History of left breast cancer   Medial epicondylitis of left elbow    Marked improvement on daily meloxicam 7.5mg  daily. Continue. Now off amitriptyline.       Relevant Medications   meloxicam (MOBIC) 7.5 MG tablet   Osteoporosis    Update DEXA. Has been forgetting PO boniva. Will try and schedule IV boniva for patient - she thinks she would do better with med compliance with this.       Relevant Medications   Cholecalciferol (VITAMIN D3) 50000 units TABS   Other Relevant Orders   DG Bone Density   Vitamin B12 deficiency    b12 shot today. rec start 1028mcg daily.       Vitamin D deficiency    Persistent despite cal/vit D 2 tablets daily. Will replace with vit D 50,000 IU weekly x 3-6 months.           Follow up plan: Return in about 6 months (around 08/16/2017) for follow up visit.  [name], MD

## 2017-02-14 NOTE — Assessment & Plan Note (Signed)
Persistent despite cal/vit D 2 tablets daily. Will replace with vit D 50,000 IU weekly x 3-6 months.

## 2017-02-14 NOTE — Assessment & Plan Note (Signed)
Marked improvement on daily meloxicam 7.5mg  daily. Continue. Now off amitriptyline.

## 2017-02-14 NOTE — Assessment & Plan Note (Signed)
b12 shot today. rec start 1060mcg daily.

## 2017-02-14 NOTE — Assessment & Plan Note (Signed)
Congratulated on remaining abstinent from smoking.

## 2017-02-14 NOTE — Progress Notes (Signed)
Pre visit review using our clinic review tool, if applicable. No additional management support is needed unless otherwise documented below in the visit note. 

## 2017-02-28 ENCOUNTER — Other Ambulatory Visit: Payer: BLUE CROSS/BLUE SHIELD

## 2017-02-28 ENCOUNTER — Ambulatory Visit
Admission: RE | Admit: 2017-02-28 | Discharge: 2017-02-28 | Disposition: A | Discharge status: Home / Self Care | Payer: BLUE CROSS/BLUE SHIELD | Source: Ambulatory Visit | Attending: Family Medicine | Admitting: Family Medicine

## 2017-02-28 DIAGNOSIS — Z780 Asymptomatic menopausal state: Secondary | ICD-10-CM | POA: Diagnosis not present

## 2017-02-28 DIAGNOSIS — M810 Age-related osteoporosis without current pathological fracture: Secondary | ICD-10-CM

## 2017-03-11 ENCOUNTER — Other Ambulatory Visit: Payer: Self-pay | Admitting: Family Medicine

## 2017-07-15 ENCOUNTER — Other Ambulatory Visit: Payer: Self-pay | Admitting: Family Medicine

## 2017-08-17 ENCOUNTER — Other Ambulatory Visit: Payer: Self-pay | Admitting: Family Medicine

## 2017-08-17 DIAGNOSIS — E559 Vitamin D deficiency, unspecified: Secondary | ICD-10-CM

## 2017-08-17 DIAGNOSIS — E538 Deficiency of other specified B group vitamins: Secondary | ICD-10-CM

## 2017-08-19 ENCOUNTER — Other Ambulatory Visit (INDEPENDENT_AMBULATORY_CARE_PROVIDER_SITE_OTHER): Payer: BLUE CROSS/BLUE SHIELD

## 2017-08-19 DIAGNOSIS — E559 Vitamin D deficiency, unspecified: Secondary | ICD-10-CM

## 2017-08-19 DIAGNOSIS — E538 Deficiency of other specified B group vitamins: Secondary | ICD-10-CM

## 2017-08-19 LAB — VITAMIN D 25 HYDROXY (VIT D DEFICIENCY, FRACTURES): VITD: 51.93 ng/mL (ref 30.00–100.00)

## 2017-08-19 LAB — VITAMIN B12

## 2017-08-22 ENCOUNTER — Telehealth: Payer: Self-pay | Admitting: Family Medicine

## 2017-08-22 ENCOUNTER — Ambulatory Visit (INDEPENDENT_AMBULATORY_CARE_PROVIDER_SITE_OTHER): Payer: BLUE CROSS/BLUE SHIELD | Admitting: Family Medicine

## 2017-08-22 ENCOUNTER — Encounter: Payer: Self-pay | Admitting: Family Medicine

## 2017-08-22 ENCOUNTER — Telehealth: Payer: Self-pay

## 2017-08-22 VITALS — BP 118/70 | HR 59 | Temp 97.8°F | Wt 174.2 lb

## 2017-08-22 DIAGNOSIS — E538 Deficiency of other specified B group vitamins: Secondary | ICD-10-CM

## 2017-08-22 DIAGNOSIS — E559 Vitamin D deficiency, unspecified: Secondary | ICD-10-CM

## 2017-08-22 DIAGNOSIS — Z23 Encounter for immunization: Secondary | ICD-10-CM | POA: Diagnosis not present

## 2017-08-22 DIAGNOSIS — G44209 Tension-type headache, unspecified, not intractable: Secondary | ICD-10-CM | POA: Insufficient documentation

## 2017-08-22 DIAGNOSIS — G44201 Tension-type headache, unspecified, intractable: Secondary | ICD-10-CM

## 2017-08-22 DIAGNOSIS — M810 Age-related osteoporosis without current pathological fracture: Secondary | ICD-10-CM

## 2017-08-22 MED ORDER — KETOROLAC TROMETHAMINE 30 MG/ML IJ SOLN
30.0000 mg | Freq: Once | INTRAMUSCULAR | Status: AC
  Administered 2017-08-22: 30 mg via INTRAMUSCULAR

## 2017-08-22 MED ORDER — CYCLOBENZAPRINE HCL 10 MG PO TABS: 5.0000 mg | ORAL_TABLET | Freq: Two times a day (BID) | ORAL | 0 refills | Status: DC | PRN

## 2017-08-22 MED ORDER — CYANOCOBALAMIN 500 MCG PO TABS: 500.0000 ug | ORAL_TABLET | Freq: Every day | ORAL | Status: DC

## 2017-08-22 MED ORDER — VITAMIN D3 1.25 MG (50000 UT) PO TABS: 1.0000 | ORAL_TABLET | ORAL | 1 refills | Status: DC

## 2017-08-22 NOTE — Assessment & Plan Note (Signed)
rec continue vit D 50,000 IU weekly. She did not achieve goal levels on 4000 IU daily.

## 2017-08-22 NOTE — Progress Notes (Signed)
BP 118/70 (BP Location: Right Arm, Patient Position: Sitting, Cuff Size: Normal)   Pulse (!) 59   Temp 97.8 F (36.6 C) (Oral)   Wt 174 lb 4 oz (79 kg)   LMP 02/14/2005 (Approximate)   SpO2 98%   BMI 29.91 kg/m    CC: 6 mo f/u visit Subjective:    Patient ID: Sabrina Wright, female    DOB: [birth year], 56 y.o.   MRN: [id number]  HPI: SHARAH FINNELL is a 56 y.o. female presenting on 08/22/2017 for 6 mo follow-up   6d h/o "stress headache" that starts occipital region and travels to vertex of head. Tried tylenol, ibuprofen, aleve, PM meds. She regularly takes meloxicam 7.5mg  daily for L arm pain. Prior was on imitrex. She has not tried flexeril muscle relaxant for headaches.   Osteoporosis DEXA 01/2014 - on monthly boniva and vit D 4000 IU daily. Continues taking boniva but has continued difficulty remembering to take pill due to hectic schedule. She has been on boniva for about a year.   Chronic medial epicondylitis - referred to ortho and completed physical therapy course. Most benefit with addition of meloxicam 7.5mg  daily - told arthritis related. Off amitriptyline.   Relevant past medical, surgical, family and social history reviewed and updated as indicated. Interim medical history since our last visit reviewed. Allergies and medications reviewed and updated. Outpatient Medications Prior to Visit  Medication Sig Dispense Refill  . Calcium Carb-Cholecalciferol (CALCIUM 600/VITAMIN D3) 600-800 MG-UNIT TABS Take 2 tablets by mouth daily. 60 tablet   . clobetasol cream (TEMOVATE) 0.34 % APPLY 1 APPLICATION TOPICALLY 2 (TWO) TIMES DAILY. APPLY TO AFFECTED AREA NO MORE THAN 2 WEEKS AT A TIME+ 30 g 0  . ibandronate (BONIVA) 150 MG tablet Take 1 tablet (150 mg total) by mouth every 30 (thirty) days. Take in the morning with a full glass of water, on an empty stomach 3 tablet 3  . meloxicam (MOBIC) 7.5 MG tablet TAKE 1 TABLET BY MOUTH DAILY 30 tablet 1  . Multiple Vitamin (MULTIVITAMIN  WITH MINERALS) TABS tablet Take 1 tablet by mouth at bedtime.    . sertraline (ZOLOFT) 50 MG tablet take 1 tablet by mouth once daily 90 tablet 1  . Cholecalciferol (VITAMIN D3) 50000 units TABS Take 1 tablet by mouth once a week. 12 tablet 1   No facility-administered medications prior to visit.      Per HPI unless specifically indicated in ROS section below Review of Systems     Objective:    BP 118/70 (BP Location: Right Arm, Patient Position: Sitting, Cuff Size: Normal)   Pulse (!) 59   Temp 97.8 F (36.6 C) (Oral)   Wt 174 lb 4 oz (79 kg)   LMP 02/14/2005 (Approximate)   SpO2 98%   BMI 29.91 kg/m   Wt Readings from Last 3 Encounters:  08/22/17 174 lb 4 oz (79 kg)  02/14/17 186 lb 4 oz (84.5 kg)  08/15/16 181 lb 8 oz (82.3 kg)    Physical Exam  Constitutional: She appears well-developed and well-nourished. No distress.  HENT:  Mouth/Throat: Oropharynx is clear and moist. No oropharyngeal exudate.  Cardiovascular: Normal rate, regular rhythm, normal heart sounds and intact distal pulses.   No murmur heard. Pulmonary/Chest: Effort normal and breath sounds normal. No respiratory distress. She has no wheezes. She has no rales.  Musculoskeletal: She exhibits no edema.  Psychiatric: She has a normal mood and affect.  Nursing note and vitals reviewed.  Results  for orders placed or performed in visit on 08/19/17  Vitamin B12  Result Value Ref Range   Vitamin B-12 >1500 (H) 211 - 911 pg/mL  VITAMIN D 25 Hydroxy (Vit-D Deficiency, Fractures)  Result Value Ref Range   VITD 51.93 30.00 - 100.00 ng/mL      Assessment & Plan:   Problem List Items Addressed This Visit    Osteoporosis    Has trouble with weekly boniva due to hectic work schedule. Off and on this med over the past year.  I will ask if [name] can check into prolia coverage for patient.       Relevant Medications   Cholecalciferol (VITAMIN D3) 50000 units TABS   Tension type headache - Primary    TTH vs  occipital neuralgia. Treat today with toradol 30mg  IM (advised not to take meloxicam or other NSAID today).  Sent home with flexeril to use abortively.  Update [place] with effect.       Relevant Medications   cyclobenzaprine (FLEXERIL) 10 MG tablet   Vitamin B12 deficiency    Levels high - rec decrease vit B12 to 551mcg daily.       Vitamin D deficiency    rec continue vit D 50,000 IU weekly. She did not achieve goal levels on 4000 IU daily.           Follow up plan: Return in about 6 months (around 02/20/2018) for annual exam, prior fasting for blood work.  [name], MD

## 2017-08-22 NOTE — Telephone Encounter (Signed)
[facility] left v/m requesting cb for cholecalciferol 50,000 units to verify wants Vit D 3 instead of Vit D 2.

## 2017-08-22 NOTE — Telephone Encounter (Signed)
plz check into prolia coverage for patient. boniva for ~1 yr, has trouble remembering to take due to work schedule.

## 2017-08-22 NOTE — Patient Instructions (Addendum)
Flu shot today toradol shot today 30mg  for headache then may use flexeril 10mg  muscle relaxant 1/2-1 tablet as needed if you get a headache. This will make you sleepy.  Vit D levels are now normal! Continue 50,000 units weekly - refilled today. Vitamin B12 was high - decrease to 530mcg daily or current dose three times a week.

## 2017-08-22 NOTE — Assessment & Plan Note (Addendum)
TTH vs occipital neuralgia. Treat today with toradol 30mg  IM (advised not to take meloxicam or other NSAID today).  Sent home with flexeril to use abortively.  Update [place] with effect.

## 2017-08-22 NOTE — Telephone Encounter (Signed)
Yes I want vit D3. Called and spoke with Bea. Consider D2 at next refill.

## 2017-08-22 NOTE — Telephone Encounter (Signed)
Bea with [facility] request cb asap on whether wanted Vit D 2 or 3.

## 2017-08-22 NOTE — Assessment & Plan Note (Signed)
Levels high - rec decrease vit B12 to 56mcg daily.

## 2017-08-22 NOTE — Addendum Note (Signed)
Addended by: Brenton Grills on: 90/10/2240 11:01 AM   Modules accepted: Orders

## 2017-08-22 NOTE — Assessment & Plan Note (Signed)
Has trouble with weekly boniva due to hectic work schedule. Off and on this med over the past year.  I will ask if [name] can check into prolia coverage for patient.

## 2017-08-22 NOTE — Addendum Note (Signed)
Addended by: Brenton Grills on: 35/24/8185 08:32 AM   Modules accepted: Orders

## 2017-09-01 NOTE — Telephone Encounter (Signed)
Information has been submitted to pts insurance for verification of benefits. Awaiting response for coverage  

## 2017-09-23 ENCOUNTER — Other Ambulatory Visit: Payer: Self-pay | Admitting: Family Medicine

## 2017-10-20 ENCOUNTER — Encounter: Payer: Self-pay | Admitting: Family Medicine

## 2017-10-20 ENCOUNTER — Ambulatory Visit (INDEPENDENT_AMBULATORY_CARE_PROVIDER_SITE_OTHER): Payer: BLUE CROSS/BLUE SHIELD | Admitting: Family Medicine

## 2017-10-20 VITALS — BP 118/70 | HR 84 | Temp 98.6°F | Wt 171.0 lb

## 2017-10-20 DIAGNOSIS — L600 Ingrowing nail: Secondary | ICD-10-CM

## 2017-10-20 MED ORDER — HYDROCODONE-ACETAMINOPHEN 5-325 MG PO TABS: 1.0000 | ORAL_TABLET | Freq: Four times a day (QID) | ORAL | 0 refills | Status: DC | PRN

## 2017-10-20 MED ORDER — CEPHALEXIN 500 MG PO CAPS: 500.0000 mg | ORAL_CAPSULE | Freq: Three times a day (TID) | ORAL | 0 refills | Status: DC

## 2017-10-20 NOTE — Assessment & Plan Note (Signed)
Anticipate ingrown toenail, developing infection. Pt already on daily meloxicam 7.5mg .  Will cover for infection with keflex 500mg  tid. Rx vicodin for breakthrough pain  Referral to podiatry for eval/treatment.  Pt agrees with plan.

## 2017-10-20 NOTE — Patient Instructions (Signed)
You have ingrown toenail with infection. Continue meloxicam 7.5mg  daily, add vicodin for breakthrough pain, and start short course of antibiotic keflex three times daily for 5 days. See Rosaria Ferries to schedule podiatry referral.
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
## 2017-10-22 ENCOUNTER — Telehealth: Payer: Self-pay | Admitting: Family Medicine

## 2017-10-22 NOTE — Telephone Encounter (Signed)
Patient is requesting a Dr's note to take her out of work today 12/19 and tomm 12/20. She  Is hurting and on her feet at work for 8 hours. She is seeing the Foot Dr tomorrow to get toe nail removed. Please call the patient back if note can be given. Call patient at [phone]

## 2017-10-22 NOTE — Telephone Encounter (Signed)
Left message on vm per dpr notifying pt her note is ready to pick up. [Placed note at front office.]

## 2017-10-22 NOTE — Telephone Encounter (Signed)
Ok to do work note for this. Thanks.

## 2017-10-22 NOTE — Telephone Encounter (Signed)
Printed note. Placed in Dr. Synthia Innocent box to sign.

## 2017-10-23 ENCOUNTER — Ambulatory Visit (INDEPENDENT_AMBULATORY_CARE_PROVIDER_SITE_OTHER): Payer: BLUE CROSS/BLUE SHIELD | Admitting: Podiatry

## 2017-10-23 ENCOUNTER — Ambulatory Visit (INDEPENDENT_AMBULATORY_CARE_PROVIDER_SITE_OTHER): Payer: BLUE CROSS/BLUE SHIELD

## 2017-10-23 ENCOUNTER — Encounter: Payer: Self-pay | Admitting: Podiatry

## 2017-10-23 DIAGNOSIS — M659 Synovitis and tenosynovitis, unspecified: Secondary | ICD-10-CM

## 2017-10-23 DIAGNOSIS — L600 Ingrowing nail: Secondary | ICD-10-CM

## 2017-10-23 DIAGNOSIS — L02611 Cutaneous abscess of right foot: Secondary | ICD-10-CM | POA: Diagnosis not present

## 2017-10-23 DIAGNOSIS — M869 Osteomyelitis, unspecified: Secondary | ICD-10-CM | POA: Diagnosis not present

## 2017-10-23 DIAGNOSIS — L03031 Cellulitis of right toe: Secondary | ICD-10-CM | POA: Diagnosis not present

## 2017-10-23 MED ORDER — DOXYCYCLINE HYCLATE 100 MG PO TABS: 100.0000 mg | ORAL_TABLET | Freq: Two times a day (BID) | ORAL | 0 refills | Status: DC

## 2017-10-23 NOTE — Patient Instructions (Signed)
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
## 2017-10-26 LAB — WOUND CULTURE
MICRO NUMBER: 81434018
SPECIMEN QUALITY:: ADEQUATE

## 2017-10-29 ENCOUNTER — Ambulatory Visit (INDEPENDENT_AMBULATORY_CARE_PROVIDER_SITE_OTHER): Payer: BLUE CROSS/BLUE SHIELD | Admitting: Podiatry

## 2017-10-29 ENCOUNTER — Ambulatory Visit (INDEPENDENT_AMBULATORY_CARE_PROVIDER_SITE_OTHER): Payer: BLUE CROSS/BLUE SHIELD

## 2017-10-29 DIAGNOSIS — M869 Osteomyelitis, unspecified: Secondary | ICD-10-CM | POA: Diagnosis not present

## 2017-10-29 DIAGNOSIS — L600 Ingrowing nail: Secondary | ICD-10-CM

## 2017-10-29 MED ORDER — DOXYCYCLINE HYCLATE 100 MG PO TABS: 100.0000 mg | ORAL_TABLET | Freq: Two times a day (BID) | ORAL | 0 refills | Status: DC

## 2017-10-29 NOTE — Progress Notes (Signed)
Subjective: Sabrina Wright is a 56 y.o.  female returns to office today for follow up evaluation after having right medial hallux nail avulsion performed. Patient has been soaking using epsom salts and applying topical antibiotic covered with bandaid daily.  She states the toe feels much better she has not noticed any drainage or pus.  She is to continue with antibiotics.  Her pain is much improved.  Patient denies fevers, chills, nausea, vomiting. Denies any calf pain, chest pain, SOB.   Objective:  Vitals: Reviewed  General: Well developed, nourished, in no acute distress, alert and oriented x3   Dermatology: Skin is warm, dry and supple bilateral. Right hallux nail border appears to be clean, dry, with mild granular tissue and surrounding scab. There is no surrounding erythema, edema, drainage/purulence.  There is no probing to bone.  The area appears to be healing well.  There is no fluctuance or crepitus.  There is no malodor.  No ascending sialitis.  The remaining nails appear unremarkable at this time. There are no other lesions or other signs of infection present.  Neurovascular status: Intact. No lower extremity swelling; No pain with calf compression bilateral.  Musculoskeletal: No tenderness to palpation of the right hallux nail fold. Muscular strength within normal limits bilateral.   Assesement and Plan: S/p partial nail avulsion, doing well.   -Continue soaking in epsom salts twice a day followed by antibiotic ointment and a band-aid. Can leave uncovered at night. Continue this until completely healed.  -Repeat x-rays were performed today and there is no definitive evidence of acute osteomyelitis there is no soft tissue emphysema. -Continue antibiotics -Follow-up in 2 weeks or sooner if needed -Monitor for any signs/symptoms of infection. Call the office immediately if any occur or go directly to the emergency room. Call with any questions/concerns.  Celesta Gentile, DPM

## 2017-10-29 NOTE — Patient Instructions (Signed)

## 2017-11-13 ENCOUNTER — Ambulatory Visit: Payer: BLUE CROSS/BLUE SHIELD | Admitting: Podiatry

## 2017-12-15 ENCOUNTER — Other Ambulatory Visit: Payer: Self-pay | Admitting: Family Medicine

## 2017-12-15 DIAGNOSIS — Z1231 Encounter for screening mammogram for malignant neoplasm of breast: Secondary | ICD-10-CM

## 2017-12-17 ENCOUNTER — Other Ambulatory Visit: Payer: Self-pay | Admitting: Family Medicine

## 2017-12-17 NOTE — Telephone Encounter (Signed)
Temovate 0.05 % cream refill Last OV: 02/14/17 Last Refill:03/01/16 0 refills Pharmacy:[facility] on [address]

## 2017-12-17 NOTE — Telephone Encounter (Signed)
Copied from [place]. Topic: Quick Communication - Rx Refill/Question >> Dec 17, 2017  4:37 PM Ahmed Prima L wrote: Medication:  clobetasol cream (TEMOVATE) 0.05 %  Has the patient contacted their pharmacy? yes  (Agent: If no, request that the patient contact the pharmacy for the refill.)   Preferred Pharmacy (with phone number or street name): [facility] [address]   Agent: Please be advised that RX refills may take up to 3 business days. We ask that you follow-up with your pharmacy.

## 2017-12-18 NOTE — Telephone Encounter (Signed)
Last Rx 2017. Last OV 10/2017. pls advise

## 2017-12-19 MED ORDER — CLOBETASOL PROPIONATE 0.05 % EX CREA: TOPICAL_CREAM | CUTANEOUS | 0 refills | Status: DC

## 2017-12-22 ENCOUNTER — Telehealth: Payer: Self-pay | Admitting: Family Medicine

## 2017-12-22 DIAGNOSIS — N6320 Unspecified lump in the left breast, unspecified quadrant: Secondary | ICD-10-CM

## 2017-12-22 NOTE — Telephone Encounter (Signed)
Copied from [place] [id number]. Topic: General - Other >> Dec 22, 2017 10:21 AM Marin Olp L wrote: Reason for CRM: Patient needs another order for mammo b/c she found a lump in left breast. There is an order in as of 12/15/2017, but they said they needed another at [facility]. Please clarify for patient at earliest convenience.

## 2017-12-23 NOTE — Telephone Encounter (Signed)
Left message on vm per dpr relaying message per Dr. G.  

## 2017-12-23 NOTE — Telephone Encounter (Signed)
Referral placed. plz notify patient.

## 2017-12-26 ENCOUNTER — Ambulatory Visit
Admission: RE | Admit: 2017-12-26 | Discharge: 2017-12-26 | Disposition: A | Discharge status: Home / Self Care | Payer: BLUE CROSS/BLUE SHIELD | Source: Ambulatory Visit | Attending: Family Medicine | Admitting: Family Medicine

## 2017-12-26 DIAGNOSIS — R928 Other abnormal and inconclusive findings on diagnostic imaging of breast: Secondary | ICD-10-CM | POA: Diagnosis not present

## 2017-12-26 DIAGNOSIS — N6320 Unspecified lump in the left breast, unspecified quadrant: Secondary | ICD-10-CM

## 2017-12-26 HISTORY — DX: Personal history of antineoplastic chemotherapy: Z92.21

## 2017-12-26 HISTORY — DX: Personal history of irradiation: Z92.3

## 2017-12-26 LAB — HM MAMMOGRAPHY

## 2017-12-29 ENCOUNTER — Encounter: Payer: Self-pay | Admitting: Family Medicine

## 2018-01-21 ENCOUNTER — Ambulatory Visit: Payer: BLUE CROSS/BLUE SHIELD

## 2018-02-17 ENCOUNTER — Other Ambulatory Visit: Payer: Self-pay | Admitting: Family Medicine

## 2018-02-17 ENCOUNTER — Other Ambulatory Visit: Payer: BLUE CROSS/BLUE SHIELD

## 2018-02-17 DIAGNOSIS — Z131 Encounter for screening for diabetes mellitus: Secondary | ICD-10-CM

## 2018-02-17 DIAGNOSIS — E538 Deficiency of other specified B group vitamins: Secondary | ICD-10-CM

## 2018-02-17 DIAGNOSIS — E559 Vitamin D deficiency, unspecified: Secondary | ICD-10-CM

## 2018-02-19 ENCOUNTER — Encounter: Payer: BLUE CROSS/BLUE SHIELD | Admitting: Family Medicine

## 2018-02-24 ENCOUNTER — Other Ambulatory Visit: Payer: Self-pay | Admitting: Family Medicine

## 2018-02-24 DIAGNOSIS — Z139 Encounter for screening, unspecified: Secondary | ICD-10-CM

## 2018-03-02 ENCOUNTER — Other Ambulatory Visit (INDEPENDENT_AMBULATORY_CARE_PROVIDER_SITE_OTHER): Payer: BLUE CROSS/BLUE SHIELD

## 2018-03-02 DIAGNOSIS — E538 Deficiency of other specified B group vitamins: Secondary | ICD-10-CM | POA: Diagnosis not present

## 2018-03-02 DIAGNOSIS — E559 Vitamin D deficiency, unspecified: Secondary | ICD-10-CM | POA: Diagnosis not present

## 2018-03-02 DIAGNOSIS — Z131 Encounter for screening for diabetes mellitus: Secondary | ICD-10-CM | POA: Diagnosis not present

## 2018-03-02 LAB — BASIC METABOLIC PANEL
BUN: 16 mg/dL (ref 6–23)
CALCIUM: 9.5 mg/dL (ref 8.4–10.5)
CO2: 30 mEq/L (ref 19–32)
Chloride: 103 mEq/L (ref 96–112)
Creatinine, Ser: 0.73 mg/dL (ref 0.40–1.20)
GFR: 87.32 mL/min (ref >60.00)
Glucose, Bld: 88 mg/dL (ref 70–99)
Potassium: 4.3 mEq/L (ref 3.5–5.1)
SODIUM: 140 meq/L (ref 135–145)

## 2018-03-02 LAB — VITAMIN B12: VITAMIN B 12: 1151 pg/mL — AB (ref 211–911)

## 2018-03-02 LAB — VITAMIN D 25 HYDROXY (VIT D DEFICIENCY, FRACTURES): VITD: 67.49 ng/mL (ref 30.00–100.00)

## 2018-03-04 ENCOUNTER — Ambulatory Visit (INDEPENDENT_AMBULATORY_CARE_PROVIDER_SITE_OTHER): Payer: BLUE CROSS/BLUE SHIELD | Admitting: Family Medicine

## 2018-03-04 ENCOUNTER — Other Ambulatory Visit (HOSPITAL_COMMUNITY)
Admission: RE | Admit: 2018-03-04 | Discharge: 2018-03-04 | Disposition: A | Discharge status: Home / Self Care | Payer: BLUE CROSS/BLUE SHIELD | Source: Ambulatory Visit | Attending: Family Medicine | Admitting: Family Medicine

## 2018-03-04 ENCOUNTER — Encounter: Payer: Self-pay | Admitting: Family Medicine

## 2018-03-04 VITALS — BP 128/80 | HR 55 | Temp 98.3°F | Ht 63.5 in | Wt 166.5 lb

## 2018-03-04 DIAGNOSIS — M810 Age-related osteoporosis without current pathological fracture: Secondary | ICD-10-CM

## 2018-03-04 DIAGNOSIS — E559 Vitamin D deficiency, unspecified: Secondary | ICD-10-CM

## 2018-03-04 DIAGNOSIS — Z0000 Encounter for general adult medical examination without abnormal findings: Secondary | ICD-10-CM | POA: Diagnosis not present

## 2018-03-04 DIAGNOSIS — E538 Deficiency of other specified B group vitamins: Secondary | ICD-10-CM

## 2018-03-04 DIAGNOSIS — Z87891 Personal history of nicotine dependence: Secondary | ICD-10-CM

## 2018-03-04 MED ORDER — SERTRALINE HCL 50 MG PO TABS: 50.0000 mg | ORAL_TABLET | Freq: Every day | ORAL | 3 refills | Status: DC

## 2018-03-04 MED ORDER — MELOXICAM 7.5 MG PO TABS: 7.5000 mg | ORAL_TABLET | Freq: Every day | ORAL | 11 refills | Status: DC

## 2018-03-04 NOTE — Telephone Encounter (Signed)
Seeing patient today - any updates on prolia? thanks

## 2018-03-04 NOTE — Telephone Encounter (Signed)
I dont see that it ever came back. There is nothing in her media tab, even stating we got a response. I can resubmit it. Im not sure why it was never received.

## 2018-03-04 NOTE — Progress Notes (Signed)
BP 128/80 (BP Location: Right Arm, Patient Position: Sitting, Cuff Size: Normal)   Pulse (!) 55   Temp 98.3 F (36.8 C) (Oral)   Ht 5' 3.5" (1.613 m)   Wt 166 lb 8 oz (75.5 kg)   LMP 02/14/2005 (Approximate)   SpO2 97%   BMI 29.03 kg/m    CC: CPE Subjective:    Patient ID: Sabrina Wright, female    DOB: [birth year], 57 y.o.   MRN: [id number]  HPI: Sabrina Wright is a 57 y.o. female presenting on 03/04/2018 for Annual Exam   Preventative: COLONOSCOPY 2017 TAx2, diverticulosis, rpt 5 yrs (Nandigam) Mammogram normal 01/2017 WNL, h/o breast cancer. Diagnostic mammo/US 12/2017 for new L breast spot of concern - clear. rec rpt mammo 01/2018 - this is scheduled for May 2019. She does breast exams daily at home.  Well woman exam - normal 2016. h/o abnl paps in past, latest 3 were normal. Rpt today. Denies vaginal bleeding, discharge or pelvic discomfort Osteoporosis - off boniva for the past year. We are awaiting prolia coverage verification.  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 Tdap 02/2015  Pneumovax 02/2015 Seat belt use discussed Sunscreen use discussed. No changing moles on skin.  Ex smoker - quit last year. ~20 PY smoker Alcohol - rare  Lives with husband and 2 daughters, 2 dogs and 2 cats  Occupation: Animal nutritionist at [employer]  Edu: HS  Activity: walks daily Diet: good water, fruits/vegetables daily, red meat 2x/wk, fish rarely.  Relevant past medical, surgical, family and social history reviewed and updated as indicated. Interim medical history since our last visit reviewed. Allergies and medications reviewed and updated. Outpatient Medications Prior to Visit  Medication Sig Dispense Refill  . Calcium Carb-Cholecalciferol (CALCIUM 600/VITAMIN D3) 600-800 MG-UNIT TABS Take 2 tablets by mouth daily. 60 tablet   . Cholecalciferol (VITAMIN D3) 50000 units TABS Take 1 tablet by mouth once a week. 12 tablet 1  .  clobetasol cream (TEMOVATE) 6.78 % APPLY 1 APPLICATION TOPICALLY 2 (TWO) TIMES DAILY. APPLY TO AFFECTED AREA NO MORE THAN 2 WEEKS AT A TIME+ 30 g 0  . cyanocobalamin (V-R VITAMIN B-12) 500 MCG tablet Take 1 tablet (500 mcg total) by mouth daily.    Marland Kitchen HYDROcodone-acetaminophen (NORCO/VICODIN) 5-325 MG tablet Take 1 tablet by mouth every 6 (six) hours as needed for moderate pain. 15 tablet 0  . Multiple Vitamin (MULTIVITAMIN WITH MINERALS) TABS tablet Take 1 tablet by mouth at bedtime.    . meloxicam (MOBIC) 7.5 MG tablet TAKE 1 TABLET BY MOUTH DAILY 30 tablet 5  . sertraline (ZOLOFT) 50 MG tablet take 1 tablet by mouth once daily 90 tablet 1  . ibandronate (BONIVA) 150 MG tablet Take 1 tablet (150 mg total) by mouth every 30 (thirty) days. Take in the morning with a full glass of water, on an empty stomach (Patient not taking: Reported on 03/04/2018) 3 tablet 3  . cephALEXin (KEFLEX) 500 MG capsule Take 1 capsule (500 mg total) by mouth 3 (three) times daily. 15 capsule 0  . cyclobenzaprine (FLEXERIL) 10 MG tablet Take 0.5-1 tablets (5-10 mg total) by mouth 2 (two) times daily as needed (headache). 30 tablet 0  . doxycycline (VIBRA-TABS) 100 MG tablet Take 1 tablet (100 mg total) by mouth 2 (two) times daily. 20 tablet 0   No facility-administered medications prior to visit.      Per HPI unless  specifically indicated in ROS section below Review of Systems  Constitutional: Negative for activity change, appetite change, chills, fatigue, fever and unexpected weight change.  HENT: Negative for hearing loss.   Eyes: Negative for visual disturbance.  Respiratory: Negative for cough, chest tightness, shortness of breath and wheezing.   Cardiovascular: Negative for chest pain, palpitations and leg swelling.  Gastrointestinal: Negative for abdominal distention, abdominal pain, blood in stool, constipation, diarrhea, nausea and vomiting.  Genitourinary: Negative for difficulty urinating and hematuria.    Musculoskeletal: Negative for arthralgias, myalgias and neck pain.  Skin: Negative for rash.  Neurological: Negative for dizziness, seizures, syncope and headaches.  Hematological: Negative for adenopathy. Does not bruise/bleed easily.  Psychiatric/Behavioral: Negative for dysphoric mood. The patient is not nervous/anxious.        Objective:    BP 128/80 (BP Location: Right Arm, Patient Position: Sitting, Cuff Size: Normal)   Pulse (!) 55   Temp 98.3 F (36.8 C) (Oral)   Ht 5' 3.5" (1.613 m)   Wt 166 lb 8 oz (75.5 kg)   LMP 02/14/2005 (Approximate)   SpO2 97%   BMI 29.03 kg/m   Wt Readings from Last 3 Encounters:  03/04/18 166 lb 8 oz (75.5 kg)  10/20/17 171 lb (77.6 kg)  08/22/17 174 lb 4 oz (79 kg)    Physical Exam  Constitutional: She is oriented to person, place, and time. She appears well-developed and well-nourished. No distress.  HENT:  Head: Normocephalic and atraumatic.  Right Ear: Hearing, tympanic membrane, external ear and ear canal normal.  Left Ear: Hearing, tympanic membrane, external ear and ear canal normal.  Nose: Nose normal.  Mouth/Throat: Uvula is midline, oropharynx is clear and moist and mucous membranes are normal. No oropharyngeal exudate, posterior oropharyngeal edema or posterior oropharyngeal erythema.  Eyes: Pupils are equal, round, and reactive to light. Conjunctivae and EOM are normal. No scleral icterus.  Neck: Normal range of motion. Neck supple. No thyromegaly present.  Cardiovascular: Normal rate, regular rhythm, normal heart sounds and intact distal pulses.  No murmur heard. Pulses:      Radial pulses are 2+ on the right side, and 2+ on the left side.  Pulmonary/Chest: Effort normal and breath sounds normal. No respiratory distress. She has no wheezes. She has no rales. She exhibits no tenderness. Right breast exhibits no inverted nipple, no mass, no nipple discharge, no skin change and no tenderness. Left breast exhibits no inverted  nipple, no mass, no nipple discharge, no skin change and no tenderness.  Abdominal: Soft. Bowel sounds are normal. She exhibits no distension and no mass. There is no tenderness. There is no rebound and no guarding.  Genitourinary: Uterus normal. Pelvic exam was performed with patient supine. There is no rash, tenderness or lesion on the right labia. There is no rash, tenderness or lesion on the left labia. Cervix exhibits no motion tenderness and no discharge. Right adnexum displays no mass and no tenderness. Left adnexum displays no mass and no tenderness. No erythema in the vagina. No signs of injury around the vagina. Vaginal discharge (thicker white) found.  Genitourinary Comments: Pap performed on cervix Some cervical stenosis noted - difficulty with endocervical sampling  Musculoskeletal: Normal range of motion. She exhibits no edema.  Lymphadenopathy:       Head (right side): No submental, no submandibular, no tonsillar, no preauricular and no posterior auricular adenopathy present.       Head (left side): No submental, no submandibular, no tonsillar, no preauricular and no  posterior auricular adenopathy present.    She has no cervical adenopathy.    She has no axillary adenopathy.       Right axillary: No lateral adenopathy present.       Left axillary: No lateral adenopathy present.      Right: No supraclavicular adenopathy present.       Left: No supraclavicular adenopathy present.  Neurological: She is alert and oriented to person, place, and time.  CN grossly intact, station and gait intact  Skin: Skin is warm and dry. No rash noted.  Psychiatric: She has a normal mood and affect. Her behavior is normal. Judgment and thought content normal.  Nursing note and vitals reviewed.  Results for orders placed or performed in visit on 16/10/96  Basic metabolic panel  Result Value Ref Range   Sodium 140 135 - 145 mEq/L   Potassium 4.3 3.5 - 5.1 mEq/L   Chloride 103 96 - 112 mEq/L   CO2  30 19 - 32 mEq/L   Glucose, Bld 88 70 - 99 mg/dL   BUN 16 6 - 23 mg/dL   Creatinine, Ser 0.73 0.40 - 1.20 mg/dL   Calcium 9.5 8.4 - 10.5 mg/dL   GFR 87.32 >60.00 mL/min  VITAMIN D 25 Hydroxy (Vit-D Deficiency, Fractures)  Result Value Ref Range   VITD 67.49 30.00 - 100.00 ng/mL  Vitamin B12  Result Value Ref Range   Vitamin B-12 1,151 (H) 211 - 911 pg/mL      Assessment & Plan:   Problem List Items Addressed This Visit    Ex-smoker    Quit 12/2015 congratulated on ongoing abstinence      Health maintenance examination - Primary    Preventative protocols reviewed and updated unless pt declined. Discussed healthy diet and lifestyle.       Osteoporosis    Continues cal/vit D, weight bearing exercise. Off boniva for the past year. We are awaiting prolia coverage verification.       Vitamin B12 deficiency    Levels remain elevated despite current dose 1000 mcg once weekly. Suggested she buy 523mcg dose next refill.       Vitamin D deficiency    Continue 50,000 IU weekly. She did not achieve goal levels on 4000 IU daily.           Meds ordered this encounter  Medications  . meloxicam (MOBIC) 7.5 MG tablet    Sig: Take 1 tablet (7.5 mg total) by mouth daily.    Dispense:  30 tablet    Refill:  11  . sertraline (ZOLOFT) 50 MG tablet    Sig: Take 1 tablet (50 mg total) by mouth daily.    Dispense:  90 tablet    Refill:  3   No orders of the defined types were placed in this encounter.   Follow up plan: Return in about 1 year (around 03/05/2019) for annual exam, prior fasting for blood work.  [name], MD

## 2018-03-04 NOTE — Assessment & Plan Note (Signed)
Preventative protocols reviewed and updated unless pt declined. Discussed healthy diet and lifestyle.  

## 2018-03-04 NOTE — Assessment & Plan Note (Addendum)
Continues cal/vit D, weight bearing exercise. Off boniva for the past year. We are awaiting prolia coverage verification.

## 2018-03-04 NOTE — Telephone Encounter (Signed)
Spoke to [employer] and confirmed benefits were submitted with original request in Oct2018. Fax sent to [facility] and date received at the bottom of form. Agent states she is unaware why benefits were not received in the office. There have been no changes in pts insurance coverage and it has been resubmitted via phone and a new response should be received within 24-48hrs

## 2018-03-04 NOTE — Assessment & Plan Note (Signed)
Quit 12/2015 congratulated on ongoing abstinence

## 2018-03-04 NOTE — Assessment & Plan Note (Signed)
Levels remain elevated despite current dose 1000 mcg once weekly. Suggested she buy 552mcg dose next refill.

## 2018-03-04 NOTE — Assessment & Plan Note (Signed)
Continue 50,000 IU weekly. She did not achieve goal levels on 4000 IU daily.

## 2018-03-04 NOTE — Addendum Note (Signed)
Addended by: Brenton Grills on: 0/11/5866 25:74 PM   Modules accepted: Orders

## 2018-03-04 NOTE — Patient Instructions (Signed)
You are doing well today! Keep up the good work. Return as needed or in 1 year for next physical.  Health Maintenance, Female Adopting a healthy lifestyle and getting preventive care can go a long way to promote health and wellness. Talk with your health care provider about what schedule of regular examinations is right for you. This is a good chance for you to check in with your provider about disease prevention and staying healthy. In between checkups, there are plenty of things you can do on your own. Experts have done a lot of research about which lifestyle changes and preventive measures are most likely to keep you healthy. Ask your health care provider for more information. Weight and diet Eat a healthy diet  Be sure to include plenty of vegetables, fruits, low-fat dairy products, and lean protein.  Do not eat a lot of foods high in solid fats, added sugars, or salt.  Get regular exercise. This is one of the most important things you can do for your health. ? Most adults should exercise for at least 150 minutes each week. The exercise should increase your heart rate and make you sweat (moderate-intensity exercise). ? Most adults should also do strengthening exercises at least twice a week. This is in addition to the moderate-intensity exercise.  Maintain a healthy weight  Body mass index (BMI) is a measurement that can be used to identify possible weight problems. It estimates body fat based on height and weight. Your health care provider can help determine your BMI and help you achieve or maintain a healthy weight.  For females 1 years of age and older: ? A BMI below 18.5 is considered underweight. ? A BMI of 18.5 to 24.9 is normal. ? A BMI of 25 to 29.9 is considered overweight. ? A BMI of 30 and above is considered obese.  Watch levels of cholesterol and blood lipids  You should start having your blood tested for lipids and cholesterol at 57 years of age, then have this test  every 5 years.  You may need to have your cholesterol levels checked more often if: ? Your lipid or cholesterol levels are high. ? You are older than 57 years of age. ? You are at high risk for heart disease.  Cancer screening Lung Cancer  Lung cancer screening is recommended for adults 8-57 years old who are at high risk for lung cancer because of a history of smoking.  A yearly low-dose CT scan of the lungs is recommended for people who: ? Currently smoke. ? Have quit within the past 15 years. ? Have at least a 30-pack-year history of smoking. A pack year is smoking an average of one pack of cigarettes a day for 1 year.  Yearly screening should continue until it has been 15 years since you quit.  Yearly screening should stop if you develop a health problem that would prevent you from having lung cancer treatment.  Breast Cancer  Practice breast self-awareness. This means understanding how your breasts normally appear and feel.  It also means doing regular breast self-exams. Let your health care provider know about any changes, no matter how small.  If you are in your 20s or 30s, you should have a clinical breast exam (CBE) by a health care provider every 1-3 years as part of a regular health exam.  If you are 82 or older, have a CBE every year. Also consider having a breast X-ray (mammogram) every year.  If you have a  family history of breast cancer, talk to your health care provider about genetic screening.  If you are at high risk for breast cancer, talk to your health care provider about having an MRI and a mammogram every year.  Breast cancer gene (BRCA) assessment is recommended for women who have family members with BRCA-related cancers. BRCA-related cancers include: ? Breast. ? Ovarian. ? Tubal. ? Peritoneal cancers.  Results of the assessment will determine the need for genetic counseling and BRCA1 and BRCA2 testing.  Cervical Cancer Your health care provider  may recommend that you be screened regularly for cancer of the pelvic organs (ovaries, uterus, and vagina). This screening involves a pelvic examination, including checking for microscopic changes to the surface of your cervix (Pap test). You may be encouraged to have this screening done every 3 years, beginning at age 50.  For women ages 60-65, health care providers may recommend pelvic exams and Pap testing every 3 years, or they may recommend the Pap and pelvic exam, combined with testing for human papilloma virus (HPV), every 5 years. Some types of HPV increase your risk of cervical cancer. Testing for HPV may also be done on women of any age with unclear Pap test results.  Other health care providers may not recommend any screening for nonpregnant women who are considered low risk for pelvic cancer and who do not have symptoms. Ask your health care provider if a screening pelvic exam is right for you.  If you have had past treatment for cervical cancer or a condition that could lead to cancer, you need Pap tests and screening for cancer for at least 20 years after your treatment. If Pap tests have been discontinued, your risk factors (such as having a new sexual partner) need to be reassessed to determine if screening should resume. Some women have medical problems that increase the chance of getting cervical cancer. In these cases, your health care provider may recommend more frequent screening and Pap tests.  Colorectal Cancer  This type of cancer can be detected and often prevented.  Routine colorectal cancer screening usually begins at 57 years of age and continues through 57 years of age.  Your health care provider may recommend screening at an earlier age if you have risk factors for colon cancer.  Your health care provider may also recommend using home test kits to check for hidden blood in the stool.  A small camera at the end of a tube can be used to examine your colon directly  (sigmoidoscopy or colonoscopy). This is done to check for the earliest forms of colorectal cancer.  Routine screening usually begins at age 58.  Direct examination of the colon should be repeated every 5-10 years through 57 years of age. However, you may need to be screened more often if early forms of precancerous polyps or small growths are found.  Skin Cancer  Check your skin from head to toe regularly.  Tell your health care provider about any new moles or changes in moles, especially if there is a change in a mole's shape or color.  Also tell your health care provider if you have a mole that is larger than the size of a pencil eraser.  Always use sunscreen. Apply sunscreen liberally and repeatedly throughout the day.  Protect yourself by wearing long sleeves, pants, a wide-brimmed hat, and sunglasses whenever you are outside.  Heart disease, diabetes, and high blood pressure  High blood pressure causes heart disease and increases the risk of  stroke. High blood pressure is more likely to develop in: ? People who have blood pressure in the high end of the normal range (130-139/85-89 mm Hg). ? People who are overweight or obese. ? People who are African American.  If you are 32-32 years of age, have your blood pressure checked every 3-5 years. If you are 79 years of age or older, have your blood pressure checked every year. You should have your blood pressure measured twice-once when you are at a hospital or clinic, and once when you are not at a hospital or clinic. Record the average of the two measurements. To check your blood pressure when you are not at a hospital or clinic, you can use: ? An automated blood pressure machine at a pharmacy. ? A home blood pressure monitor.  If you are between 33 years and 15 years old, ask your health care provider if you should take aspirin to prevent strokes.  Have regular diabetes screenings. This involves taking a blood sample to check your  fasting blood sugar level. ? If you are at a normal weight and have a low risk for diabetes, have this test once every three years after 57 years of age. ? If you are overweight and have a high risk for diabetes, consider being tested at a younger age or more often. Preventing infection Hepatitis B  If you have a higher risk for hepatitis B, you should be screened for this virus. You are considered at high risk for hepatitis B if: ? You were born in a country where hepatitis B is common. Ask your health care provider which countries are considered high risk. ? Your parents were born in a high-risk country, and you have not been immunized against hepatitis B (hepatitis B vaccine). ? You have HIV or AIDS. ? You use needles to inject street drugs. ? You live with someone who has hepatitis B. ? You have had sex with someone who has hepatitis B. ? You get hemodialysis treatment. ? You take certain medicines for conditions, including cancer, organ transplantation, and autoimmune conditions.  Hepatitis C  Blood testing is recommended for: ? Everyone born from [birth year] through 1965. ? Anyone with known risk factors for hepatitis C.  Sexually transmitted infections (STIs)  You should be screened for sexually transmitted infections (STIs) including gonorrhea and chlamydia if: ? You are sexually active and are younger than 57 years of age. ? You are older than 57 years of age and your health care provider tells you that you are at risk for this type of infection. ? Your sexual activity has changed since you were last screened and you are at an increased risk for chlamydia or gonorrhea. Ask your health care provider if you are at risk.  If you do not have HIV, but are at risk, it may be recommended that you take a prescription medicine daily to prevent HIV infection. This is called pre-exposure prophylaxis (PrEP). You are considered at risk if: ? You are sexually active and do not regularly use condoms  or know the HIV status of your partner(s). ? You take drugs by injection. ? You are sexually active with a partner who has HIV.  Talk with your health care provider about whether you are at high risk of being infected with HIV. If you choose to begin PrEP, you should first be tested for HIV. You should then be tested every 3 months for as long as you are taking PrEP. Pregnancy  If  you are premenopausal and you may become pregnant, ask your health care provider about preconception counseling.  If you may become pregnant, take 400 to 800 micrograms (mcg) of folic acid every day.  If you want to prevent pregnancy, talk to your health care provider about birth control (contraception). Osteoporosis and menopause  Osteoporosis is a disease in which the bones lose minerals and strength with aging. This can result in serious bone fractures. Your risk for osteoporosis can be identified using a bone density scan.  If you are 46 years of age or older, or if you are at risk for osteoporosis and fractures, ask your health care provider if you should be screened.  Ask your health care provider whether you should take a calcium or vitamin D supplement to lower your risk for osteoporosis.  Menopause may have certain physical symptoms and risks.  Hormone replacement therapy may reduce some of these symptoms and risks. Talk to your health care provider about whether hormone replacement therapy is right for you. Follow these instructions at home:  Schedule regular health, dental, and eye exams.  Stay current with your immunizations.  Do not use any tobacco products including cigarettes, chewing tobacco, or electronic cigarettes.  If you are pregnant, do not drink alcohol.  If you are breastfeeding, limit how much and how often you drink alcohol.  Limit alcohol intake to no more than 1 drink per day for nonpregnant women. One drink equals 12 ounces of beer, 5 ounces of wine, or 1 ounces of hard  liquor.  Do not use street drugs.  Do not share needles.  Ask your health care provider for help if you need support or information about quitting drugs.  Tell your health care provider if you often feel depressed.  Tell your health care provider if you have ever been abused or do not feel safe at home. This information is not intended to replace advice given to you by your health care provider. Make sure you discuss any questions you have with your health care provider. Document Released: 05/06/2011 Document Revised: 03/28/2016 Document Reviewed: 07/25/2015 Elsevier Interactive Patient Education  [employer].

## 2018-03-06 LAB — CYTOLOGY - PAP
Bacterial vaginitis: NEGATIVE
Candida vaginitis: NEGATIVE
Chlamydia: NEGATIVE
Diagnosis: NEGATIVE
HPV (WINDOPATH): NOT DETECTED
Neisseria Gonorrhea: NEGATIVE
TRICH (WINDOWPATH): NEGATIVE

## 2018-03-06 LAB — CERVICOVAGINAL ANCILLARY ONLY: HERPES (WINDOWPATH): NEGATIVE

## 2018-03-06 MED ORDER — DENOSUMAB 60 MG/ML ~~LOC~~ SOSY: 60.0000 mg | PREFILLED_SYRINGE | SUBCUTANEOUS | 1 refills | Status: DC

## 2018-03-06 NOTE — Addendum Note (Signed)
Addended by: [name] on: 03/06/2018 02:01 PM   Modules accepted: Orders

## 2018-03-06 NOTE — Telephone Encounter (Signed)
Verification of benefits have been processed and an approval has been received for pts prolia injection. Pts estimated cost are appx $2700, in addition to 20% of the cost of the medication.   According to the prolia rep, neither financial assistance or the copay card would be of assistance to pt. She suggested that a Rx be sent to pt's normal pharmacy to have benefits run as a prescription and pt can come to office for the injection, alone. pls advise

## 2018-03-06 NOTE — Telephone Encounter (Signed)
I've sent prolia Rx to her pharmacy to price out. plz notify patient of what you found out. Thank you.

## 2018-03-06 NOTE — Telephone Encounter (Signed)
Spoke to pharmacy who states pt has a $0 copay for the prolia Rx and will not have to pay anything out of pocket. Lm on pts vm requesting a call back. Advised pharmacy to hold on filling Rx until I have spoken with pt; will have her contact directly when ready.

## 2018-03-09 NOTE — Telephone Encounter (Addendum)
Pt called back to cancel nurse visit on 03/10/18 because the prolia will not be at local [facility] tomorrow; pt will cb and reschedule when can get prolia picked up.

## 2018-03-09 NOTE — Telephone Encounter (Signed)
Spoke to pt and advised. Pt is agreeable and [facility] and she will bring it to 5/7 nurse visit appt

## 2018-03-09 NOTE — Telephone Encounter (Signed)
Per CRM, Pt returned call. Attempted to contact pt; unable to reach. If pt returns call after 1400, please ask for [facility] directly.

## 2018-03-09 NOTE — Telephone Encounter (Signed)
Lm on pts vm requesting a call back 

## 2018-03-10 ENCOUNTER — Telehealth: Payer: Self-pay

## 2018-03-10 ENCOUNTER — Ambulatory Visit: Payer: BLUE CROSS/BLUE SHIELD

## 2018-03-10 NOTE — Telephone Encounter (Signed)
[facility] Prolia form filled out and faxed back. Waiting on response

## 2018-03-19 NOTE — Telephone Encounter (Signed)
Fax was never received. Completed PA via phone. Awaiting response within 72hrs via fax.

## 2018-03-20 ENCOUNTER — Ambulatory Visit
Admission: RE | Admit: 2018-03-20 | Discharge: 2018-03-20 | Disposition: A | Discharge status: Home / Self Care | Payer: BLUE CROSS/BLUE SHIELD | Source: Ambulatory Visit | Attending: Family Medicine | Admitting: Family Medicine

## 2018-03-20 ENCOUNTER — Encounter: Payer: Self-pay | Admitting: Family Medicine

## 2018-03-20 DIAGNOSIS — Z139 Encounter for screening, unspecified: Secondary | ICD-10-CM

## 2018-03-20 DIAGNOSIS — Z1231 Encounter for screening mammogram for malignant neoplasm of breast: Secondary | ICD-10-CM | POA: Diagnosis not present

## 2018-03-20 LAB — HM MAMMOGRAPHY

## 2018-03-23 NOTE — Telephone Encounter (Signed)
Also See 03/10/18 phone note.

## 2018-03-23 NOTE — Telephone Encounter (Signed)
Fax received indicating prolia approved through 03/19/2020. Copy faxed to pharmacy and lmom to advise pt of approval. Also advised pt she will need to contact pharmacy to have med filled and then schedule nurse visit for injection. She will also need a Ca lab if injection is scheduled after 5/29

## 2018-03-24 ENCOUNTER — Other Ambulatory Visit: Payer: Self-pay | Admitting: Family Medicine

## 2018-03-24 ENCOUNTER — Telehealth: Payer: Self-pay

## 2018-03-24 NOTE — Telephone Encounter (Signed)
Spoke to pt and advised of approval. [facility] states med must be shipped through [place] due to insurance restrictions. Pt advised and states she will contact them directly to schedule delivery of medication

## 2018-03-24 NOTE — Telephone Encounter (Signed)
Lm on pts vm requesting a call back 

## 2018-03-24 NOTE — Telephone Encounter (Signed)
See additional phone note. 

## 2018-03-24 NOTE — Telephone Encounter (Signed)
Copied from [place] [id number]. Topic: Quick Communication - Office Called Patient >> Mar 09, 2018  9:20 AM [name] wrote: Reason for CRM: patient states that someone called her about medicine for osteopetrosis  >> Mar 09, 2018 12:37 PM Modena Nunnery, CMA wrote: Attempted to contact pt back; unable to reach. If pt contacts office back, pls ask for [name] or [name] and advise [name] its ok to inform pt per phone note. >> Mar 23, 2018  5:07 PM Percell Belt A wrote: Pt called back in and stated that her ins is telling her that they are not going to pay for the Prolia.  She would if the dr could just call her something in or other suggestions

## 2018-03-25 NOTE — Telephone Encounter (Signed)
Copied from [place] [phone]. Topic: Quick Communication - Rx Refill/Question >> Mar 25, 2018 11:15 AM Aurelio Brash B wrote: Medication:denosumab (PROLIA) 60 MG/ML SOSY injection  Has the patient contacted their pharmacy? Yes  the pharmacy for the refill.) (Agent: If yes, when and what did the pharmacy advise?)  Preferred Pharmacy (with phone number or street name):[facility]  Agent: Please be advised that RX refills may take up to 3 business days. We ask that you follow-up with your pharmacy.

## 2018-03-26 NOTE — Telephone Encounter (Signed)
LOV  03/04/18 Dr. Danise Mina Last refill on Prolia 03/06/18

## 2018-03-27 ENCOUNTER — Telehealth: Payer: Self-pay | Admitting: Family Medicine

## 2018-03-27 NOTE — Telephone Encounter (Signed)
Copied from [place] [phone]. Topic: Quick Communication - See Telephone Encounter >> Mar 27, 2018  4:11 PM Vernona Rieger wrote: CRM for notification. See Telephone encounter for: 03/27/18.  [facility] called and said she wants to schedule the delivery of the prolia medication Call back is [phone]

## 2018-03-27 NOTE — Telephone Encounter (Signed)
Spoke to [place] at [place] and scheduled delivery for 5/30 between the hours of 0800-1630. Lm on pts vm and advised of delivery

## 2018-04-03 NOTE — Telephone Encounter (Signed)
Lm on pts vm and advised medication has been delivered to office. Should pt return call, pls schedule lab appt and prolia injection on nurse visit schedule, even if lab orders have not been entered.

## 2018-04-06 ENCOUNTER — Other Ambulatory Visit: Payer: Self-pay | Admitting: Family Medicine

## 2018-04-06 ENCOUNTER — Other Ambulatory Visit (INDEPENDENT_AMBULATORY_CARE_PROVIDER_SITE_OTHER): Payer: BLUE CROSS/BLUE SHIELD

## 2018-04-06 DIAGNOSIS — M810 Age-related osteoporosis without current pathological fracture: Secondary | ICD-10-CM

## 2018-04-07 ENCOUNTER — Other Ambulatory Visit: Payer: BLUE CROSS/BLUE SHIELD

## 2018-04-07 LAB — CALCIUM: Calcium: 9.5 mg/dL (ref 8.4–10.5)

## 2018-04-09 ENCOUNTER — Ambulatory Visit (INDEPENDENT_AMBULATORY_CARE_PROVIDER_SITE_OTHER): Payer: BLUE CROSS/BLUE SHIELD

## 2018-04-09 DIAGNOSIS — E538 Deficiency of other specified B group vitamins: Secondary | ICD-10-CM

## 2018-04-09 DIAGNOSIS — M810 Age-related osteoporosis without current pathological fracture: Secondary | ICD-10-CM | POA: Diagnosis not present

## 2018-04-09 MED ORDER — DENOSUMAB 60 MG/ML ~~LOC~~ SOSY
60.0000 mg | PREFILLED_SYRINGE | Freq: Once | SUBCUTANEOUS | Status: AC
  Administered 2018-04-09: 60 mg via SUBCUTANEOUS

## 2018-04-09 MED ORDER — CYANOCOBALAMIN 1000 MCG/ML IJ SOLN: 1000.0000 ug | Freq: Once | INTRAMUSCULAR | Status: DC

## 2018-04-09 NOTE — Progress Notes (Signed)
Per orders of Dr. Danise Mina, injection of Prolia given by Ozzie Hoyle. Patient tolerated injection well.

## 2018-07-03 ENCOUNTER — Emergency Department: Payer: BLUE CROSS/BLUE SHIELD

## 2018-07-03 ENCOUNTER — Encounter: Payer: Self-pay | Admitting: Emergency Medicine

## 2018-07-03 ENCOUNTER — Ambulatory Visit: Payer: Self-pay | Admitting: *Deleted

## 2018-07-03 ENCOUNTER — Emergency Department
Admission: EM | Admit: 2018-07-03 | Discharge: 2018-07-03 | Disposition: A | Discharge status: Home / Self Care | Payer: BLUE CROSS/BLUE SHIELD | Attending: Emergency Medicine | Admitting: Emergency Medicine

## 2018-07-03 DIAGNOSIS — R110 Nausea: Secondary | ICD-10-CM | POA: Diagnosis not present

## 2018-07-03 DIAGNOSIS — Z923 Personal history of irradiation: Secondary | ICD-10-CM | POA: Insufficient documentation

## 2018-07-03 DIAGNOSIS — R197 Diarrhea, unspecified: Secondary | ICD-10-CM | POA: Diagnosis not present

## 2018-07-03 DIAGNOSIS — R51 Headache: Secondary | ICD-10-CM | POA: Diagnosis not present

## 2018-07-03 DIAGNOSIS — Z87891 Personal history of nicotine dependence: Secondary | ICD-10-CM | POA: Insufficient documentation

## 2018-07-03 DIAGNOSIS — Z9221 Personal history of antineoplastic chemotherapy: Secondary | ICD-10-CM | POA: Diagnosis not present

## 2018-07-03 DIAGNOSIS — R519 Headache, unspecified: Secondary | ICD-10-CM

## 2018-07-03 DIAGNOSIS — Z853 Personal history of malignant neoplasm of breast: Secondary | ICD-10-CM | POA: Insufficient documentation

## 2018-07-03 DIAGNOSIS — R42 Dizziness and giddiness: Secondary | ICD-10-CM | POA: Diagnosis not present

## 2018-07-03 LAB — URINALYSIS, COMPLETE (UACMP) WITH MICROSCOPIC
Bilirubin Urine: NEGATIVE
GLUCOSE, UA: NEGATIVE mg/dL
HGB URINE DIPSTICK: NEGATIVE
KETONES UR: NEGATIVE mg/dL
Leukocytes, UA: NEGATIVE
NITRITE: NEGATIVE
PROTEIN: NEGATIVE mg/dL
Specific Gravity, Urine: 1.008 (ref 1.005–1.030)
pH: 6 (ref 5.0–8.0)

## 2018-07-03 LAB — BASIC METABOLIC PANEL
Anion gap: 8 (ref 5–15)
BUN: 14 mg/dL (ref 6–20)
CO2: 28 mmol/L (ref 22–32)
CREATININE: 0.45 mg/dL (ref 0.44–1.00)
Calcium: 8.8 mg/dL — ABNORMAL LOW (ref 8.9–10.3)
Chloride: 105 mmol/L (ref 98–111)
GFR calc Af Amer: >60 mL/min (ref >60)
GFR calc non Af Amer: >60 mL/min (ref >60)
GLUCOSE: 89 mg/dL (ref 70–99)
Potassium: 3.8 mmol/L (ref 3.5–5.1)
Sodium: 141 mmol/L (ref 135–145)

## 2018-07-03 LAB — CBC
HCT: 39.5 % (ref 35.0–47.0)
Hemoglobin: 13.2 g/dL (ref 12.0–16.0)
MCH: 29.7 pg (ref 26.0–34.0)
MCHC: 33.4 g/dL (ref 32.0–36.0)
MCV: 88.7 fL (ref 80.0–100.0)
Platelets: 321 10*3/uL (ref 150–440)
RBC: 4.45 MIL/uL (ref 3.80–5.20)
RDW: 14 % (ref 11.5–14.5)
WBC: 8.5 10*3/uL (ref 3.6–11.0)

## 2018-07-03 LAB — TROPONIN I

## 2018-07-03 MED ORDER — MECLIZINE HCL 25 MG PO TABS: 25.0000 mg | ORAL_TABLET | Freq: Three times a day (TID) | ORAL | 1 refills | Status: DC | PRN

## 2018-07-03 MED ORDER — MECLIZINE HCL 25 MG PO TABS
50.0000 mg | ORAL_TABLET | Freq: Once | ORAL | Status: AC
Start: 2018-07-03 — End: 2018-07-03
  Administered 2018-07-03: 50 mg via ORAL
  Filled 2018-07-03: qty 1

## 2018-07-03 MED ORDER — SODIUM CHLORIDE 0.9 % IV SOLN
Freq: Once | INTRAVENOUS | Status: AC
  Administered 2018-07-03: 10:00:00 via INTRAVENOUS

## 2018-07-03 MED ORDER — METOCLOPRAMIDE HCL 5 MG/ML IJ SOLN
10.0000 mg | Freq: Once | INTRAMUSCULAR | Status: AC
  Administered 2018-07-03: 10 mg via INTRAVENOUS
  Filled 2018-07-03: qty 2

## 2018-07-03 MED ORDER — DIPHENHYDRAMINE HCL 50 MG/ML IJ SOLN
25.0000 mg | Freq: Once | INTRAMUSCULAR | Status: AC
  Administered 2018-07-03: 25 mg via INTRAVENOUS
  Filled 2018-07-03: qty 1

## 2018-07-03 MED ORDER — KETOROLAC TROMETHAMINE 30 MG/ML IJ SOLN
15.0000 mg | Freq: Once | INTRAMUSCULAR | Status: AC
  Administered 2018-07-03: 15 mg via INTRAVENOUS
  Filled 2018-07-03: qty 1

## 2018-07-03 MED ORDER — METOCLOPRAMIDE HCL 10 MG PO TABS: 10.0000 mg | ORAL_TABLET | Freq: Three times a day (TID) | ORAL | 1 refills | Status: DC | PRN
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
## 2018-07-03 NOTE — ED Notes (Signed)
Pt asleep at this time, warm blanket placed on patient. Room darkened.

## 2018-07-03 NOTE — ED Notes (Signed)
Pt reports feeling dizzy and headache. Pt states she has hx of migraines several years ago but the pain does not feel like a normal migraine. Pt also states that she has some photosensitivity and nausea associated with the dizziness.  Pt has blanket over eyes.

## 2018-07-03 NOTE — ED Notes (Addendum)
First Nurse Note:  Patient complaining of dizziness that began yesterday.  States it feels like she is spinning.  Denies recent hx of URI.  Placed in [facility], alert and oriented, color good.  Speech is clear.  Taken to triage.

## 2018-07-03 NOTE — ED Triage Notes (Signed)
Pt reports yesterday started getting dizzy like she was drunk. Pt reports HA as well and some NVD. Pt reports dizziness is constant.

## 2018-07-03 NOTE — ED Notes (Signed)
Williams MD to bedside with update

## 2018-07-03 NOTE — ED Notes (Signed)
Patient transported to CT 

## 2018-07-03 NOTE — Telephone Encounter (Signed)
Pt called with complaints of a headache that has been lasting a couple of weeks; starting yesterday she complains of dizziness that has gotten worse to the point that she feels like she is going is fall or pass out; she also says that she had diarrhea for the past 3 days and has had 1 episode of vomiting; recommendations made per nurse triage protocol to include going to the ED; the pt verbalizes understanding and will proceed there; will route to office for notification of this encounter.  Reason for Disposition . SEVERE dizziness (e.g., unable to stand, requires support to walk, feels like passing out now)  Answer Assessment - Initial Assessment Questions 1. DESCRIPTION: "Describe your dizziness."     Feels like she is going to pass out or fall 2. LIGHTHEADED: "Do you feel lightheaded?" (e.g., somewhat faint, woozy, weak upon standing)      Dizziness when she stands up, and staggers when she walking 3. VERTIGO: "Do you feel like either you or the room is spinning or tilting?" (i.e. vertigo)     Yes feels off balance 4. SEVERITY: "How bad is it?"  "Do you feel like you are going to faint?" "Can you stand and walk?"   - MILD - walking normally   - MODERATE - interferes with normal activities (e.g., work, school)    - SEVERE - unable to stand, requires support to walk, feels like passing out now.      severe 5. ONSET:  "When did the dizziness begin?"     07/02/18 6. AGGRAVATING FACTORS: "Does anything make it worse?" (e.g., standing, change in head position)     Standing, changing the position of her head makes it worse 7. HEART RATE: "Can you tell me your heart rate?" "How many beats in 15 seconds?"  (Note: not all patients can do this)       no 8. CAUSE: "What do you think is causing the dizziness?"    " Don't have a clue" 9. RECURRENT SYMPTOM: "Have you had dizziness before?" If so, ask: "When was the last time?" "What happened that time?"     Yes, has history of low blood pressure 10.  OTHER SYMPTOMS: "Do you have any other symptoms?" (e.g., fever, chest pain, vomiting, diarrhea, bleeding)       Diarrhea and nausea for past 3 days (8/28. 8/29, 8/30); emesis x 1 on Wed 07/01/18 11. PREGNANCY: "Is there any chance you are pregnant?" "When was your last menstrual period?"       No menopausal  Protocols used: DIZZINESS Heidi Dach

## 2018-07-06 NOTE — Telephone Encounter (Signed)
Noted. Reviewed ER eval. Discharged with meclizine with ENT f/u.

## 2018-07-13 ENCOUNTER — Other Ambulatory Visit: Payer: Self-pay | Admitting: Family Medicine

## 2018-09-28 ENCOUNTER — Telehealth: Payer: Self-pay | Admitting: *Deleted

## 2018-09-28 DIAGNOSIS — M810 Age-related osteoporosis without current pathological fracture: Secondary | ICD-10-CM

## 2018-09-28 NOTE — Telephone Encounter (Signed)
Information has been submitted to pts insurance for verification of benefits. Awaiting response for coverage  

## 2018-10-03 ENCOUNTER — Other Ambulatory Visit: Payer: Self-pay | Admitting: Family Medicine

## 2018-10-05 MED ORDER — DENOSUMAB 60 MG/ML ~~LOC~~ SOSY: 60.0000 mg | PREFILLED_SYRINGE | SUBCUTANEOUS | 1 refills | Status: DC

## 2018-10-05 NOTE — Addendum Note (Signed)
Addended by: [name] on: 10/05/2018 05:53 PM   Modules accepted: Orders

## 2018-10-05 NOTE — Telephone Encounter (Signed)
[facility]

## 2018-10-05 NOTE — Telephone Encounter (Signed)
This pts deductible is too high to process via physician benefits. She has a PA, for a Rx that is approved through 03/19/2020. Pt is needing prolia Rx [facility] [place]. Please route this back to me once Rx is sent so that I can contact pt regarding injection.

## 2018-10-05 NOTE — Addendum Note (Signed)
Addended by: [name] on: 10/05/2018 01:21 PM   Modules accepted: Orders

## 2018-10-05 NOTE — Telephone Encounter (Addendum)
[facility]. [facility]?  Labs ordered.

## 2018-10-05 NOTE — Telephone Encounter (Signed)
Spoke to pt and [facility]; medication to be delivered 12/4. prolia injection scheduled for 12/11. Pt is RTO on 12/3 for labs, if you could please enter lab order

## 2018-10-05 NOTE — Telephone Encounter (Signed)
Lm on pts vm requesting a call back 

## 2018-10-06 ENCOUNTER — Other Ambulatory Visit (INDEPENDENT_AMBULATORY_CARE_PROVIDER_SITE_OTHER): Payer: BLUE CROSS/BLUE SHIELD

## 2018-10-06 DIAGNOSIS — M810 Age-related osteoporosis without current pathological fracture: Secondary | ICD-10-CM

## 2018-10-06 LAB — BASIC METABOLIC PANEL
BUN: 18 mg/dL (ref 6–23)
CALCIUM: 9.6 mg/dL (ref 8.4–10.5)
CO2: 31 meq/L (ref 19–32)
Chloride: 104 mEq/L (ref 96–112)
Creatinine, Ser: 0.81 mg/dL (ref 0.40–1.20)
GFR: 77.28 mL/min (ref >60.00)
GLUCOSE: 76 mg/dL (ref 70–99)
POTASSIUM: 3.8 meq/L (ref 3.5–5.1)
Sodium: 141 mEq/L (ref 135–145)

## 2018-10-06 NOTE — Telephone Encounter (Signed)
Spoke to Dr Darnell Level and advised [facility]. Pt is required to use a specialty pharmacy for delivery and [facility]. Medication scheduled for delivery tomorrow

## 2018-10-14 ENCOUNTER — Ambulatory Visit (INDEPENDENT_AMBULATORY_CARE_PROVIDER_SITE_OTHER): Payer: BLUE CROSS/BLUE SHIELD

## 2018-10-14 DIAGNOSIS — M810 Age-related osteoporosis without current pathological fracture: Secondary | ICD-10-CM | POA: Diagnosis not present

## 2018-10-14 MED ORDER — DENOSUMAB 60 MG/ML ~~LOC~~ SOSY
60.0000 mg | PREFILLED_SYRINGE | Freq: Once | SUBCUTANEOUS | Status: AC
  Administered 2018-10-14: 60 mg via SUBCUTANEOUS

## 2018-10-14 NOTE — Progress Notes (Signed)
Per orders of Dr. Gutierrez, injection of Prolia given by Rafiq Bucklin. Patient tolerated injection well.  

## 2018-10-14 NOTE — Telephone Encounter (Signed)
Administered Prolia today.

## 2018-10-26 ENCOUNTER — Other Ambulatory Visit: Payer: Self-pay | Admitting: Family Medicine

## 2018-11-09 ENCOUNTER — Ambulatory Visit (INDEPENDENT_AMBULATORY_CARE_PROVIDER_SITE_OTHER): Payer: BLUE CROSS/BLUE SHIELD | Admitting: Family Medicine

## 2018-11-09 ENCOUNTER — Encounter: Payer: Self-pay | Admitting: Family Medicine

## 2018-11-09 VITALS — BP 116/64 | HR 57 | Temp 98.1°F | Ht 64.0 in | Wt 168.5 lb

## 2018-11-09 DIAGNOSIS — G43101 Migraine with aura, not intractable, with status migrainosus: Secondary | ICD-10-CM | POA: Diagnosis not present

## 2018-11-09 DIAGNOSIS — G43109 Migraine with aura, not intractable, without status migrainosus: Secondary | ICD-10-CM | POA: Insufficient documentation

## 2018-11-09 MED ORDER — SUMATRIPTAN SUCCINATE 100 MG PO TABS: 100.0000 mg | ORAL_TABLET | Freq: Once | ORAL | 0 refills | Status: DC

## 2018-11-09 MED ORDER — PROMETHAZINE HCL 25 MG/ML IJ SOLN
50.0000 mg | Freq: Once | INTRAMUSCULAR | Status: AC
  Administered 2018-11-09: 50 mg via INTRAMUSCULAR

## 2018-11-09 MED ORDER — PROMETHAZINE HCL 25 MG PO TABS: 25.0000 mg | ORAL_TABLET | Freq: Three times a day (TID) | ORAL | 0 refills | Status: DC | PRN

## 2018-11-09 NOTE — Patient Instructions (Signed)
Phenergan injection now  Go straight home Put ice pack on head Take imitrex as directed  Oral phenergan px given also -as needed   Update [place] if worse or not improving in several day  For more severe headache- to to ER

## 2018-11-09 NOTE — Assessment & Plan Note (Signed)
In pt with h/o migraine- less freq with age  Moderate R sided HA today with photophobia and nausea Has had meloxicam already today  Phenergan 50 mg IM given Px for imitrex 100 mg with directions and discussion of side effects  Also px for phenergan 25 mg Caution of sedation  inst to call if no improvement in the next day Disc health habits for migraine (good fluid intake and avoid regular caffeine intake)
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
## 2019-01-09 ENCOUNTER — Other Ambulatory Visit: Payer: Self-pay | Admitting: Family Medicine

## 2019-01-11 ENCOUNTER — Other Ambulatory Visit: Payer: Self-pay | Admitting: Family Medicine

## 2019-01-12 NOTE — Telephone Encounter (Signed)
Sumatriptan Last filled:  11/09/18, #10 Last OV:  11/09/18, acute of chronic condition Next OV:  03/08/19, CPE

## 2019-01-13 ENCOUNTER — Other Ambulatory Visit: Payer: Self-pay | Admitting: Family Medicine

## 2019-01-13 DIAGNOSIS — Z1231 Encounter for screening mammogram for malignant neoplasm of breast: Secondary | ICD-10-CM

## 2019-02-04 ENCOUNTER — Encounter: Payer: Self-pay | Admitting: Family Medicine

## 2019-02-04 ENCOUNTER — Ambulatory Visit (INDEPENDENT_AMBULATORY_CARE_PROVIDER_SITE_OTHER): Payer: BLUE CROSS/BLUE SHIELD | Admitting: Family Medicine

## 2019-02-04 ENCOUNTER — Other Ambulatory Visit: Payer: Self-pay

## 2019-02-04 VITALS — BP 122/72 | HR 62 | Temp 97.8°F | Ht 64.0 in | Wt 168.3 lb

## 2019-02-04 DIAGNOSIS — M5416 Radiculopathy, lumbar region: Secondary | ICD-10-CM

## 2019-02-04 MED ORDER — HYDROCODONE-ACETAMINOPHEN 5-325 MG PO TABS: 1.0000 | ORAL_TABLET | Freq: Four times a day (QID) | ORAL | 0 refills | Status: DC | PRN

## 2019-02-04 MED ORDER — DEXAMETHASONE SODIUM PHOSPHATE 10 MG/ML IJ SOLN
10.0000 mg | Freq: Once | INTRAMUSCULAR | Status: AC
  Administered 2019-02-04: 10 mg via INTRAMUSCULAR

## 2019-02-04 MED ORDER — METHOCARBAMOL 500 MG PO TABS: 500.0000 mg | ORAL_TABLET | Freq: Three times a day (TID) | ORAL | 0 refills | Status: DC | PRN

## 2019-02-04 NOTE — Progress Notes (Signed)
BP 122/72 (BP Location: Right Arm, Patient Position: Sitting, Cuff Size: Normal)    Pulse 62    Temp 97.8 F (36.6 C) (Oral)    Ht 5\' 4"  (1.626 m)    Wt 168 lb 5 oz (76.3 kg)    LMP 02/14/2005 (Approximate)    SpO2 99%    BMI 28.89 kg/m    CC: L hip pain Subjective:    Patient ID: Sabrina Wright, female    DOB: 11/23/1960, 58 y.o.   MRN: [id number]  HPI: LEON GOODNOW is a 58 y.o. female presenting on 02/04/2019 for Hip Pain (C/o left hip pain radiating down left leg to the knee. Occasionally has spasm in low back, left side. Started 2 days ago. Tried Tylenol, Icy Hot, heating pad and Epsom salt baths. )    2d h/o L lower back pain shooting down L buttock to lateral hip to the knee. Started while at work. Denies inciting trauma/injury or falls.   No fevers/chills, numbness/ewakness of legs, bowel/bladder accidents.  Missed work yesterday due to pain.   Has tried tylenol, icy hot, heating pad, epsom salt bath. She continues taking meloxicam 7.5mg  daily.   H/o L radiculopathy 01/2016 treated conservatively with steroid injection then NSAID course.      Relevant past medical, surgical, family and social history reviewed and updated as indicated. Interim medical history since our last visit reviewed. Allergies and medications reviewed and updated. Outpatient Medications Prior to Visit  Medication Sig Dispense Refill   Calcium Carb-Cholecalciferol (CALCIUM 600/VITAMIN D3) 600-800 MG-UNIT TABS Take 2 tablets by mouth daily. 60 tablet    clobetasol cream (TEMOVATE) 4.40 % APPLY 1 APPLICATION TOPICALLY 2 (TWO) TIMES DAILY. APPLY TO AFFECTED AREA NO MORE THAN 2 WEEKS AT A TIME+ 30 g 0   cyanocobalamin (V-R VITAMIN B-12) 500 MCG tablet Take 1 tablet (500 mcg total) by mouth daily.     denosumab (PROLIA) 60 MG/ML SOSY injection Inject 60 mg into the skin every 6 (six) months. 1 mL 1   meloxicam (MOBIC) 7.5 MG tablet Take 1 tablet (7.5 mg total) by mouth daily. 30 tablet 11   Multiple  Vitamin (MULTIVITAMIN WITH MINERALS) TABS tablet Take 1 tablet by mouth at bedtime.     promethazine (PHENERGAN) 25 MG tablet Take 1 tablet (25 mg total) by mouth every 8 (eight) hours as needed for nausea or vomiting. Caution of sedation 20 tablet 0   sertraline (ZOLOFT) 50 MG tablet TAKE 1 TABLET BY MOUTH EVERY DAY 90 tablet 0   SUMAtriptan (IMITREX) 100 MG tablet TAKE 1 TABLET BY MOUTH ONCE FOR 1 DOSE. MAY REPEAT IN 2 HOURS IF HEADACHE PERSISTS OR RECURS. 10 tablet 3   HYDROcodone-acetaminophen (NORCO/VICODIN) 5-325 MG tablet Take 1 tablet by mouth every 6 (six) hours as needed for moderate pain. 15 tablet 0   No facility-administered medications prior to visit.      Per HPI unless specifically indicated in ROS section below Review of Systems Objective:    BP 122/72 (BP Location: Right Arm, Patient Position: Sitting, Cuff Size: Normal)    Pulse 62    Temp 97.8 F (36.6 C) (Oral)    Ht 5\' 4"  (1.626 m)    Wt 168 lb 5 oz (76.3 kg)    LMP 02/14/2005 (Approximate)    SpO2 99%    BMI 28.89 kg/m   Wt Readings from Last 3 Encounters:  02/04/19 168 lb 5 oz (76.3 kg)  11/09/18 168 lb 8 oz (76.4  kg)  07/03/18 165 lb (74.8 kg)    Physical Exam Vitals signs and nursing note reviewed.  Constitutional:      Appearance: Normal appearance. She is not ill-appearing.     Comments: Uncomfortable with antalgic gait  Musculoskeletal: Normal range of motion.        General: Tenderness present.     Comments:  Tender to palpation lumbar midline spine ++ L lumbar paraspinous mm tenderness  + SLR on left No pain with int/ext rotation at hip. Neg FABER. No pain at SIJ, GTB or sciatic notch bilaterally.   Skin:    General: Skin is warm.     Findings: No rash.  Neurological:     Mental Status: She is alert.     Sensory: Sensation is intact.     Motor: Motor function is intact.     Gait: Gait abnormal (antalgic).     Deep Tendon Reflexes:     Reflex Scores:      Patellar reflexes are 2+ on the  right side and 2+ on the left side.      Achilles reflexes are 2+ on the right side and 2+ on the left side.    Comments: 5/5 strength BLE. Able to heel and toe walk       Assessment & Plan:   Problem List Items Addressed This Visit    Acute left lumbar radiculopathy - Primary    Anticipate due to L HNP with + SLR on left. No red flags today. Treat with dexamethasone 10mg  IM today, then continue meloxicam 7.5mg  daily. Rx robaxin, hydrocodone for breakthrough pain. Update if not improving with treatment, consider PT course. Discussed red flags to seek sooner care in interim. Out of work through the weekend, letter provided today. Pt agrees with plan.       Relevant Medications   methocarbamol (ROBAXIN) 500 MG tablet   dexamethasone (DECADRON) injection 10 mg (Completed)       Meds ordered this encounter  Medications   HYDROcodone-acetaminophen (NORCO/VICODIN) 5-325 MG tablet    Sig: Take 1 tablet by mouth every 6 (six) hours as needed for moderate pain.    Dispense:  15 tablet    Refill:  0   methocarbamol (ROBAXIN) 500 MG tablet    Sig: Take 1 tablet (500 mg total) by mouth 3 (three) times daily as needed for muscle spasms (sedation precautions).    Dispense:  30 tablet    Refill:  0   dexamethasone (DECADRON) injection 10 mg   No orders of the defined types were placed in this encounter.   Patient instructions: I am suspicious for herniated disc with pinched nerve - this should hopefully improve over time. Treat with steroid shot today then continue meloxicam and muscle relaxant sent to pharmacy. May try OTC salon pas or asper cream. Hydrocodone prescription for breakthrough pain.  Out of work the rest of the week.  Let us know if not improving with treatment.  If worsening pain or numbness/weakness, trouble controlling bowel/bladder, let us know for further evaluation.   Follow up plan: Return if symptoms worsen or fail to improve.  [name], MD

## 2019-02-04 NOTE — Assessment & Plan Note (Signed)
Anticipate due to L HNP with + SLR on left. No red flags today. Treat with dexamethasone 10mg  IM today, then continue meloxicam 7.5mg  daily. Rx robaxin, hydrocodone for breakthrough pain. Update if not improving with treatment, consider PT course. Discussed red flags to seek sooner care in interim. Out of work through the weekend, letter provided today. Pt agrees with plan.

## 2019-02-04 NOTE — Patient Instructions (Signed)
I am suspicious for herniated disc with pinched nerve - this should hopefully improve over time. Treat with steroid shot today then continue meloxicam and muscle relaxant sent to pharmacy. May try OTC salon pas or asper cream. Hydrocodone prescription for breakthrough pain.  Out of work the rest of the week.  Let us know if not improving with treatment.  If worsening pain or numbness/weakness, trouble controlling bowel/bladder, let us know for further evaluation.    Lumbosacral Radiculopathy Lumbosacral radiculopathy is a condition that involves the spinal nerves and nerve roots in the low back and bottom of the spine. The condition develops when these nerves and nerve roots move out of place or become inflamed and cause symptoms. What are the causes? This condition may be caused by:  Pressure from a disk that bulges out of place (herniated disk). A disk is a plate of soft cartilage that separates bones in the spine.  Disk changes that occur with age (disk degeneration).  A narrowing of the bones of the lower back (spinal stenosis).  A tumor.  An infection.  An injury that places sudden pressure on the disks that cushion the bones of your lower spine. What increases the risk? You are more likely to develop this condition if:  You are a female who is 2-25 years old.  You are a female who is 40-19 years old.  You use improper technique when lifting things.  You are overweight or live a sedentary lifestyle.  Your work requires frequent lifting.  You smoke.  You do repetitive activities that strain the spine. What are the signs or symptoms? Symptoms of this condition include:  Pain that goes down from your back into your legs (sciatica), usually on one side of the body. This is the most common symptom. The pain may be worse with sitting, coughing, or sneezing.  Pain and numbness in your legs.  Muscle weakness.  Tingling.  Loss of bladder control or bowel control. How is  this diagnosed? This condition may be diagnosed based on:  Your symptoms and medical history.  A physical exam. If the pain is lasting, you may have tests, such as:  MRI scan.  X-ray.  CT scan.  A type of X-ray used to examine the spinal canal after injecting a dye into your spine (myelogram).  A test to measure how electrical impulses move through a nerve (nerve conduction study). How is this treated? Treatment may depend on the cause of the condition and may include:  Working with a physical therapist.  Taking pain medicine.  Applying heat and ice to affected areas.  Doing stretches to improve flexibility.  Doing exercises to strengthen back muscles.  Having chiropractic spinal manipulation.  Using transcutaneous electrical nerve stimulation (TENS) therapy.  Getting a steroid injection in the spine. In some cases, no treatment is needed. If the condition is long-lasting (chronic), or if symptoms are severe, treatment may involve surgery or lifestyle changes, such as following a weight-loss plan. Follow these instructions at home: Activity  Avoid bending and other activities that make the problem worse.  Maintain a proper position when standing or sitting: ? When standing, keep your upper back and neck straight, with your shoulders pulled back. Avoid slouching. ? When sitting, keep your back straight and relax your shoulders. Do not round your shoulders or pull them backward.  Do not sit or stand in one place for long periods of time.  Take brief periods of rest throughout the day. This will reduce  your pain. It is usually better to rest by lying down or standing, not sitting.  When you are resting for longer periods, mix in some mild activity or stretching between periods of rest. This will help to prevent stiffness and pain.  Get regular exercise. Ask your health care provider what activities are safe for you. If you were shown how to do any exercises or  stretches, do them as directed by your health care provider.  Do not lift anything that is heavier than 10 lb (4.5 kg) or the limit that you are told by your health care provider. Always use proper lifting technique, which includes: ? Bending your knees. ? Keeping the load close to your body. ? Avoiding twisting. Managing pain  If directed, put ice on the affected area: ? Put ice in a plastic bag. ? Place a towel between your skin and the bag. ? Leave the ice on for 20 minutes, 2-3 times a day.  If directed, apply heat to the affected area as often as told by your health care provider. Use the heat source that your health care provider recommends, such as a moist heat pack or a heating pad. ? Place a towel between your skin and the heat source. ? Leave the heat on for 20-30 minutes. ? Remove the heat if your skin turns bright red. This is especially important if you are unable to feel pain, heat, or cold. You may have a greater risk of getting burned.  Take over-the-counter and prescription medicines only as told by your health care provider. General instructions  Sleep on a firm mattress in a comfortable position. Try lying on your side with your knees slightly bent. If you lie on your back, put a pillow under your knees.  Do not drive or use heavy machinery while taking prescription pain medicine.  If your health care provider prescribed a diet or exercise program, follow it as directed.  Keep all follow-up visits as told by your health care provider. This is important. Contact a health care provider if:  Your pain does not improve over time, even when taking pain medicines. Get help right away if:  You develop severe pain.  Your pain suddenly gets worse.  You develop increasing weakness in your legs.  You lose the ability to control your bladder or bowel.  You have difficulty walking or balancing.  You have a fever. Summary  Lumbosacral radiculopathy is a condition  that occurs when the spinal nerves and nerve roots in the lower part of the spine move out of place or become inflamed and cause symptoms.  Symptoms include pain, numbness, and tingling that go down from your back into your legs (sciatica), muscle weakness, and loss of bladder control or bowel control.  If directed, apply ice or heat to the affected area as told by your health care provider.  Follow instructions about activity, rest, and proper lifting technique. This information is not intended to replace advice given to you by your health care provider. Make sure you discuss any questions you have with your health care provider. Document Released: 10/21/2005 Document Revised: 10/09/2017 Document Reviewed: 10/09/2017 Elsevier Interactive Patient Education  2019 [employer].

## 2019-03-05 ENCOUNTER — Other Ambulatory Visit: Payer: BLUE CROSS/BLUE SHIELD

## 2019-03-08 ENCOUNTER — Encounter: Payer: BLUE CROSS/BLUE SHIELD | Admitting: Family Medicine

## 2019-03-08 ENCOUNTER — Other Ambulatory Visit: Payer: BLUE CROSS/BLUE SHIELD

## 2019-03-10 ENCOUNTER — Other Ambulatory Visit: Payer: Self-pay | Admitting: Family Medicine

## 2019-03-11 NOTE — Telephone Encounter (Signed)
Meloxicam Last filled:  02/09/19, #30 Last OV:  02/04/19, acute Next OV:  06/22/19, CPE

## 2019-03-12 ENCOUNTER — Other Ambulatory Visit: Payer: Self-pay | Admitting: Family Medicine

## 2019-03-19 ENCOUNTER — Telehealth: Payer: Self-pay | Admitting: Family Medicine

## 2019-03-19 ENCOUNTER — Other Ambulatory Visit: Payer: Self-pay | Admitting: Family Medicine

## 2019-03-19 NOTE — Telephone Encounter (Signed)
See TE.

## 2019-03-19 NOTE — Telephone Encounter (Addendum)
Dolly pharmacist at [facility] left v/m;Dolly said [facility] care did not accept transfer of med and [facility] will contact [place] for Prolia. FYI to Tamms CMA.

## 2019-03-19 NOTE — Telephone Encounter (Signed)
Pt had brought envelope in with Prolia information/invoice.  Preferred specialty pharmacy for her [employer] is [facility], [phone].   She will contact [facility] to have them order her Prolia through this specialty pharmacy.  She will either pick up Prolia from [facility] or have delivered to our office.  Please return this paperwork to when she comes in for her next Prolia next injection for her records.

## 2019-03-19 NOTE — Telephone Encounter (Signed)
Noted. Fyi to Dr. Darnell Level.  See refill request.

## 2019-03-21 NOTE — Telephone Encounter (Signed)
Sent in

## 2019-03-21 NOTE — Telephone Encounter (Signed)
Refilled to requested pharmacy

## 2019-03-22 ENCOUNTER — Ambulatory Visit: Payer: BLUE CROSS/BLUE SHIELD

## 2019-03-26 ENCOUNTER — Encounter: Payer: Self-pay | Admitting: Family Medicine

## 2019-03-26 ENCOUNTER — Ambulatory Visit
Admission: RE | Admit: 2019-03-26 | Discharge: 2019-03-26 | Disposition: A | Discharge status: Home / Self Care | Payer: BLUE CROSS/BLUE SHIELD | Source: Ambulatory Visit | Attending: Family Medicine | Admitting: Family Medicine

## 2019-03-26 ENCOUNTER — Other Ambulatory Visit: Payer: Self-pay

## 2019-03-26 DIAGNOSIS — Z1231 Encounter for screening mammogram for malignant neoplasm of breast: Secondary | ICD-10-CM

## 2019-03-26 LAB — HM MAMMOGRAPHY

## 2019-04-02 ENCOUNTER — Telehealth: Payer: Self-pay | Admitting: Family Medicine

## 2019-04-02 NOTE — Telephone Encounter (Signed)
Discussed Prolia benefits w/pt.  Pt would be responsible for 100% of admin fee if deductible not met/$0 if met.  Pt to check w/[facility] to see if they have received/processed refill and confirm if they are sending to her or our office.  She will c/b to let me know and then schedule her next injection. °

## 2019-04-02 NOTE — Telephone Encounter (Signed)
[facility] called to let office know that they will be shipping prolia to office. It will arrive on 6/3.

## 2019-04-05 ENCOUNTER — Ambulatory Visit: Payer: BLUE CROSS/BLUE SHIELD

## 2019-04-06 ENCOUNTER — Other Ambulatory Visit: Payer: Self-pay | Admitting: Family Medicine

## 2019-04-07 NOTE — Telephone Encounter (Signed)
Opened in error

## 2019-04-12 NOTE — Telephone Encounter (Signed)
[name],  Do we know if we have received this for patient?  Please check and let me know.  Appears she has been scheduled for Prolia injection on 6/17 so I just wanted to make sure.   Thanks!

## 2019-04-12 NOTE — Telephone Encounter (Signed)
Prolia received 04-09-2019

## 2019-04-21 ENCOUNTER — Other Ambulatory Visit: Payer: Self-pay

## 2019-04-21 ENCOUNTER — Ambulatory Visit (INDEPENDENT_AMBULATORY_CARE_PROVIDER_SITE_OTHER): Payer: BC Managed Care – PPO

## 2019-04-21 DIAGNOSIS — M810 Age-related osteoporosis without current pathological fracture: Secondary | ICD-10-CM

## 2019-04-21 MED ORDER — DENOSUMAB 60 MG/ML ~~LOC~~ SOSY
60.0000 mg | PREFILLED_SYRINGE | Freq: Once | SUBCUTANEOUS | Status: AC
  Administered 2019-04-21: 60 mg via SUBCUTANEOUS

## 2019-04-21 NOTE — Progress Notes (Signed)
Per orders of Dr. Danise Mina, injection of denosumab (prolia) 60mg /ml administered to right arm subcutaneously by Diamond Nickel, RN.  Patient tolerated injection well.

## 2019-05-05 ENCOUNTER — Telehealth: Payer: Self-pay | Admitting: Family Medicine

## 2019-05-05 ENCOUNTER — Telehealth: Payer: Self-pay | Admitting: *Deleted

## 2019-05-05 DIAGNOSIS — Z20822 Contact with and (suspected) exposure to covid-19: Secondary | ICD-10-CM

## 2019-05-05 NOTE — Telephone Encounter (Signed)
Best number [phone]  Pt called stating her granddaughter tested positive for covid 6/30.  Pt was around her 04/24/2019.  Pt wanted to know if she should be concerned.  She is at work and wants to make sure this is ok.  She is wearing a mask  Last couple nights hurting in back area @ night when spouse has ac and fan on  No there symptoms

## 2019-05-05 NOTE — Telephone Encounter (Signed)
Scheduled patient for COVID 19 test tomorrow at [place] at 9 am.  Testing protocol reviewed with patient.

## 2019-05-05 NOTE — Telephone Encounter (Signed)
Spoke with pt relaying Dr. Synthia Innocent message.  Pt states [facility] just called her and she has appt tomorrow at 9:00.

## 2019-05-05 NOTE — Telephone Encounter (Signed)
-----   Message from [name], MD sent at 05/05/2019  2:46 PM EDT ----- Regarding: Covid testing I would like to have patient tested. She was around her granddaughter who tested positive yesterday.  Thank you, [name]

## 2019-05-05 NOTE — Telephone Encounter (Signed)
Likely ok if grand daughter didn't have symptoms while pt was around her. However, reasonable to check - I have sent [facility] a message asking to get patient tested.

## 2019-05-06 ENCOUNTER — Other Ambulatory Visit: Payer: BC Managed Care – PPO

## 2019-05-06 ENCOUNTER — Other Ambulatory Visit: Payer: Self-pay | Admitting: Family Medicine

## 2019-05-06 DIAGNOSIS — R6889 Other general symptoms and signs: Secondary | ICD-10-CM | POA: Diagnosis not present

## 2019-05-06 DIAGNOSIS — Z20822 Contact with and (suspected) exposure to covid-19: Secondary | ICD-10-CM

## 2019-05-06 NOTE — Telephone Encounter (Signed)
Imitrex Last filled:  04/07/19, #9 Last OV:  02/04/19, acute Next OV:  06/22/19, CPE

## 2019-05-06 NOTE — Telephone Encounter (Signed)
Patient left a voicemail stating that she was just tested for Covid. Patient wants to know what she is suppose to do now? Patient stated that when she went for testing she was not given instructions on how she would get the results and what she should do now until she gets the results. Patient stated that she needs documentation for her employer.   Called patient back and advised her that she needs to self quarantine until she  get the test results back. Patient stated that she works at [employer] and needs documentation for work that she has been tested and needs to be out of work at least until the results are back. Patient requested that the letter be sent to my chart so that she can forward it to her employer.

## 2019-05-06 NOTE — Telephone Encounter (Signed)
Spoke with pt relaying Dr. G's message.  Pt verbalizes understanding and expresses her thanks.  

## 2019-05-06 NOTE — Telephone Encounter (Signed)
Letter written and sent through mychart. Should take 3-4 days for test results to return.

## 2019-05-08 LAB — NOVEL CORONAVIRUS, NAA: SARS-CoV-2, NAA: NOT DETECTED

## 2019-06-13 ENCOUNTER — Other Ambulatory Visit: Payer: Self-pay | Admitting: Family Medicine

## 2019-06-13 DIAGNOSIS — E559 Vitamin D deficiency, unspecified: Secondary | ICD-10-CM

## 2019-06-13 DIAGNOSIS — M810 Age-related osteoporosis without current pathological fracture: Secondary | ICD-10-CM

## 2019-06-13 DIAGNOSIS — E538 Deficiency of other specified B group vitamins: Secondary | ICD-10-CM

## 2019-06-16 ENCOUNTER — Other Ambulatory Visit (INDEPENDENT_AMBULATORY_CARE_PROVIDER_SITE_OTHER): Payer: BC Managed Care – PPO

## 2019-06-16 ENCOUNTER — Other Ambulatory Visit: Payer: Self-pay

## 2019-06-16 DIAGNOSIS — E538 Deficiency of other specified B group vitamins: Secondary | ICD-10-CM

## 2019-06-16 DIAGNOSIS — E559 Vitamin D deficiency, unspecified: Secondary | ICD-10-CM | POA: Diagnosis not present

## 2019-06-16 DIAGNOSIS — M810 Age-related osteoporosis without current pathological fracture: Secondary | ICD-10-CM

## 2019-06-16 LAB — COMPREHENSIVE METABOLIC PANEL
ALT: 12 U/L (ref 0–35)
AST: 18 U/L (ref 0–37)
Albumin: 4.2 g/dL (ref 3.5–5.2)
Alkaline Phosphatase: 68 U/L (ref 39–117)
BUN: 14 mg/dL (ref 6–23)
CO2: 25 mEq/L (ref 19–32)
Calcium: 9.2 mg/dL (ref 8.4–10.5)
Chloride: 108 mEq/L (ref 96–112)
Creatinine, Ser: 0.83 mg/dL (ref 0.40–1.20)
GFR: 70.52 mL/min (ref >60.00)
Glucose, Bld: 89 mg/dL (ref 70–99)
Potassium: 4.1 mEq/L (ref 3.5–5.1)
Sodium: 141 mEq/L (ref 135–145)
Total Bilirubin: 0.3 mg/dL (ref 0.2–1.2)
Total Protein: 6.8 g/dL (ref 6.0–8.3)

## 2019-06-16 LAB — VITAMIN B12: Vitamin B-12: 370 pg/mL (ref 211–911)

## 2019-06-16 LAB — VITAMIN D 25 HYDROXY (VIT D DEFICIENCY, FRACTURES): VITD: 78.72 ng/mL (ref 30.00–100.00)

## 2019-06-22 ENCOUNTER — Ambulatory Visit (INDEPENDENT_AMBULATORY_CARE_PROVIDER_SITE_OTHER): Payer: BC Managed Care – PPO | Admitting: Family Medicine

## 2019-06-22 ENCOUNTER — Other Ambulatory Visit: Payer: Self-pay

## 2019-06-22 ENCOUNTER — Encounter: Payer: Self-pay | Admitting: Family Medicine

## 2019-06-22 VITALS — BP 118/64 | HR 63 | Temp 98.0°F | Ht 63.0 in | Wt 169.1 lb

## 2019-06-22 DIAGNOSIS — M810 Age-related osteoporosis without current pathological fracture: Secondary | ICD-10-CM

## 2019-06-22 DIAGNOSIS — Z853 Personal history of malignant neoplasm of breast: Secondary | ICD-10-CM | POA: Diagnosis not present

## 2019-06-22 DIAGNOSIS — Z0000 Encounter for general adult medical examination without abnormal findings: Secondary | ICD-10-CM | POA: Diagnosis not present

## 2019-06-22 DIAGNOSIS — F411 Generalized anxiety disorder: Secondary | ICD-10-CM | POA: Diagnosis not present

## 2019-06-22 DIAGNOSIS — Z87891 Personal history of nicotine dependence: Secondary | ICD-10-CM

## 2019-06-22 DIAGNOSIS — E559 Vitamin D deficiency, unspecified: Secondary | ICD-10-CM

## 2019-06-22 DIAGNOSIS — E538 Deficiency of other specified B group vitamins: Secondary | ICD-10-CM

## 2019-06-22 MED ORDER — CYANOCOBALAMIN 500 MCG PO TABS: 500.0000 ug | ORAL_TABLET | ORAL | Status: DC

## 2019-06-22 MED ORDER — VITAMIN D3 1.25 MG (50000 UT) PO CAPS: ORAL_CAPSULE | ORAL | 1 refills | Status: DC

## 2019-06-22 MED ORDER — LORAZEPAM 0.5 MG PO TABS: 0.5000 mg | ORAL_TABLET | Freq: Two times a day (BID) | ORAL | 0 refills | Status: DC | PRN

## 2019-06-22 NOTE — Assessment & Plan Note (Signed)
Continue prolia. Update DEXA next year after a full year of prolia.

## 2019-06-22 NOTE — Assessment & Plan Note (Signed)
Congratulated on ongoing abstinence.

## 2019-06-22 NOTE — Assessment & Plan Note (Signed)
Preventative protocols reviewed and updated unless pt declined. Discussed healthy diet and lifestyle.  

## 2019-06-22 NOTE — Progress Notes (Addendum)
This visit was conducted in person.  BP 118/64 (BP Location: Left Arm, Patient Position: Sitting, Cuff Size: Normal)   Pulse 63   Temp 98 F (36.7 C) (Temporal)   Ht 5\' 3"  (1.6 m)   Wt 169 lb 2 oz (76.7 kg)   LMP 02/14/2005 (Approximate)   SpO2 96%   BMI 29.96 kg/m    CC: CPE Subjective:    Patient ID: Sabrina Wright, female    DOB: [birth year], 58 y.o.   MRN: [id number]  HPI: Sabrina Wright is a 58 y.o. female presenting on 06/22/2019 for Annual Exam (Pt states she had a panic attack yesterday. )   Bad panic attack yesterday at work - started with nausea progressing to sweats, chest tightness discomfort, tension/tightness throughout body, had to leave work, called husband to come get her. Unsure trigger - no significant stressor noted. Tends to have 1 panic attack every 6 months, not real often. Not more frequent. Yesterday was the worst one she's had. Continues sertraline 50mg  daily with benefit. Would like to try PRN anxiety medication.   Noticing intermittent imbalance.   Preventative: COLONOSCOPY 2017 TAx2, diverticulosis, rpt 5 yrs (Nandigam) Mammogram normal3/2018WNL, h/o breast cancer. mammo 03/2019 WNL. She does breast exams daily at home.  Well woman exam - Normal pap 03/2018 rpt 3 yrs.  Osteoporosis - DEXA 02/2017 T -3.2 hip, -2.9 spine. off boniva for the past year. On prolia since 10/2018. Notes some L hip pain after prolia shots - will let me know if this persists for imaging.  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 Tdap 02/2015  Pneumovax 02/2015 Seat belt use discussed Sunscreen use discussed. No changing moles on skin.  Ex smoker - quit 2018! ~20 PY smoker Alcohol - rare Dentist yearly - due Eye exam yearly - due  Lives with husband and 2 daughters, 2 dogs and 2 cats  Occupation: Animal nutritionist at [employer]  Edu: HS  Activity: walks daily Diet: good water, fruits/vegetables daily, red meat  2x/wk, fish rarely     Relevant past medical, surgical, family and social history reviewed and updated as indicated. Interim medical history since our last visit reviewed. Allergies and medications reviewed and updated. Outpatient Medications Prior to Visit  Medication Sig Dispense Refill  . Calcium Carb-Cholecalciferol (CALCIUM 600/VITAMIN D3) 600-800 MG-UNIT TABS Take 2 tablets by mouth daily. 60 tablet   . clobetasol cream (TEMOVATE) 7.40 % APPLY 1 APPLICATION TOPICALLY 2 (TWO) TIMES DAILY. APPLY TO AFFECTED AREA NO MORE THAN 2 WEEKS AT A TIME+ 30 g 0  . meloxicam (MOBIC) 7.5 MG tablet TAKE 1 TABLET BY MOUTH EVERY DAY 30 tablet 6  . Multiple Vitamin (MULTIVITAMIN WITH MINERALS) TABS tablet Take 1 tablet by mouth at bedtime.    Marland Kitchen PROLIA 60 MG/ML SOSY injection INJECT 60MG  SUBCUTANEOUSLY  EVERY 6 MONTHS (GIVEN AT  PRESCRIBERS OFFICE) 1 mL 1  . promethazine (PHENERGAN) 25 MG tablet Take 1 tablet (25 mg total) by mouth every 8 (eight) hours as needed for nausea or vomiting. Caution of sedation 20 tablet 0  . sertraline (ZOLOFT) 50 MG tablet TAKE 1 TABLET BY MOUTH EVERY DAY 90 tablet 0  . SUMAtriptan (IMITREX) 100 MG tablet TAKE 1 TABLET BY MOUTH ONCE FOR 1 DOSE. MAY REPEAT IN 2 HOURS IF HEADACHE PERSISTS OR RECURS. 9 tablet 4  . Cholecalciferol (VITAMIN D3) 1.25 MG (50000 UT) CAPS TAKE 1 CAPSULE BY MOUTH  ON THE SAME DAY EVERY WEEK AS DIRECTED. 12 capsule 1  . cyanocobalamin (V-R VITAMIN B-12) 500 MCG tablet Take 1 tablet (500 mcg total) by mouth daily.    Marland Kitchen HYDROcodone-acetaminophen (NORCO/VICODIN) 5-325 MG tablet Take 1 tablet by mouth every 6 (six) hours as needed for moderate pain. 15 tablet 0  . methocarbamol (ROBAXIN) 500 MG tablet Take 1 tablet (500 mg total) by mouth 3 (three) times daily as needed for muscle spasms (sedation precautions). 30 tablet 0   No facility-administered medications prior to visit.      Per HPI unless specifically indicated in ROS section below Review of Systems   Constitutional: Positive for chills and diaphoresis (yesterday). Negative for activity change, appetite change, fatigue, fever and unexpected weight change.  HENT: Negative for hearing loss.   Eyes: Negative for visual disturbance.  Respiratory: Positive for chest tightness and shortness of breath. Negative for cough and wheezing.   Cardiovascular: Positive for palpitations (yesterday with anxiety attack). Negative for chest pain and leg swelling.  Gastrointestinal: Positive for diarrhea (yesterday), nausea (yesterday) and vomiting (yesterday). Negative for abdominal distention, abdominal pain, blood in stool and constipation.  Genitourinary: Negative for difficulty urinating and hematuria.  Musculoskeletal: Negative for arthralgias, myalgias and neck pain.  Skin: Negative for rash.  Neurological: Positive for light-headedness (yesterday) and headaches (migraines). Negative for dizziness, seizures and syncope.  Hematological: Negative for adenopathy. Does not bruise/bleed easily.  Psychiatric/Behavioral: Negative for dysphoric mood. The patient is nervous/anxious (see HPI).    Objective:    BP 118/64 (BP Location: Left Arm, Patient Position: Sitting, Cuff Size: Normal)   Pulse 63   Temp 98 F (36.7 C) (Temporal)   Ht 5\' 3"  (1.6 m)   Wt 169 lb 2 oz (76.7 kg)   LMP 02/14/2005 (Approximate)   SpO2 96%   BMI 29.96 kg/m   Wt Readings from Last 3 Encounters:  06/22/19 169 lb 2 oz (76.7 kg)  02/04/19 168 lb 5 oz (76.3 kg)  11/09/18 168 lb 8 oz (76.4 kg)    Physical Exam Vitals signs and nursing note reviewed.  Constitutional:      General: She is not in acute distress.    Appearance: She is well-developed.  HENT:     Head: Normocephalic and atraumatic.     Right Ear: Hearing, tympanic membrane, ear canal and external ear normal.     Left Ear: Hearing, tympanic membrane, ear canal and external ear normal.     Nose: Nose normal.     Mouth/Throat:     Pharynx: Uvula midline. No  oropharyngeal exudate or posterior oropharyngeal erythema.  Eyes:     General: No scleral icterus.    Conjunctiva/sclera: Conjunctivae normal.     Pupils: Pupils are equal, round, and reactive to light.  Neck:     Musculoskeletal: Normal range of motion and neck supple.  Cardiovascular:     Rate and Rhythm: Normal rate and regular rhythm.     Pulses:          Radial pulses are 2+ on the right side and 2+ on the left side.     Heart sounds: Normal heart sounds. No murmur.  Pulmonary:     Effort: Pulmonary effort is normal. No respiratory distress.     Breath sounds: Normal breath sounds. No wheezing or rales.  Abdominal:     General: Bowel sounds are normal. There is no distension.     Palpations: Abdomen is soft. There is no mass.  Tenderness: There is no abdominal tenderness. There is no guarding or rebound.  Musculoskeletal: Normal range of motion.  Lymphadenopathy:     Cervical: No cervical adenopathy.  Skin:    General: Skin is warm and dry.     Findings: No rash.  Neurological:     Mental Status: She is alert and oriented to person, place, and time.     Comments: CN grossly intact, station and gait intact  Psychiatric:        Behavior: Behavior normal.        Thought Content: Thought content normal.        Judgment: Judgment normal.       Results for orders placed or performed in visit on 06/16/19  Comprehensive metabolic panel  Result Value Ref Range   Sodium 141 135 - 145 mEq/L   Potassium 4.1 3.5 - 5.1 mEq/L   Chloride 108 96 - 112 mEq/L   CO2 25 19 - 32 mEq/L   Glucose, Bld 89 70 - 99 mg/dL   BUN 14 6 - 23 mg/dL   Creatinine, Ser 0.83 0.40 - 1.20 mg/dL   Total Bilirubin 0.3 0.2 - 1.2 mg/dL   Alkaline Phosphatase 68 39 - 117 U/L   AST 18 0 - 37 U/L   ALT 12 0 - 35 U/L   Total Protein 6.8 6.0 - 8.3 g/dL   Albumin 4.2 3.5 - 5.2 g/dL   Calcium 9.2 8.4 - 10.5 mg/dL   GFR 70.52 >60.00 mL/min  Vitamin B12  Result Value Ref Range   Vitamin B-12 370 211 - 911  pg/mL  VITAMIN D 25 Hydroxy (Vit-D Deficiency, Fractures)  Result Value Ref Range   VITD 78.72 30.00 - 100.00 ng/mL   Depression screen [facility] 2/9 06/22/2019 03/04/2018  Decreased Interest 0 0  Down, Depressed, Hopeless 0 0  PHQ - 2 Score 0 0  Altered sleeping 2 -  Tired, decreased energy 3 -  Change in appetite 1 -  Feeling bad or failure about yourself  1 -  Trouble concentrating 1 -  Moving slowly or fidgety/restless 0 -  Suicidal thoughts 0 -  PHQ-9 Score 8 -    GAD 7 : Generalized Anxiety Score 06/22/2019  Nervous, Anxious, on Edge 1  Control/stop worrying 1  Worry too much - different things 1  Trouble relaxing 1  Restless 0  Easily annoyed or irritable 0  Afraid - awful might happen 1  Total GAD 7 Score 5    Assessment & Plan:   Problem List Items Addressed This Visit    Vitamin D deficiency    Stable on weekly replacement.       Vitamin B12 deficiency    Increase replacement to MWF dosing      Osteoporosis    Continue prolia. Update DEXA next year after a full year of prolia.      Relevant Medications   Cholecalciferol (VITAMIN D3) 1.25 MG (50000 UT) CAPS   History of left breast cancer   Health maintenance examination - Primary    Preventative protocols reviewed and updated unless pt declined. Discussed healthy diet and lifestyle.       Ex-smoker    Congratulated on ongoing abstinence.       Anxiety state    Acute anxiety attack yesterday despite daily sertraline. Rare episodes. Will trial lorazepam. Reviewed controlled substance, habit forming nature of med. Will Rx #10 tablets. Update with effect.       Relevant Medications  LORazepam (ATIVAN) 0.5 MG tablet       Meds ordered this encounter  Medications  . LORazepam (ATIVAN) 0.5 MG tablet    Sig: Take 1 tablet (0.5 mg total) by mouth 2 (two) times daily as needed for anxiety.    Dispense:  10 tablet    Refill:  0  . vitamin B-12 (V-R VITAMIN B-12) 500 MCG tablet    Sig: Take 1 tablet (500  mcg total) by mouth every Monday, Wednesday, and Friday.  . Cholecalciferol (VITAMIN D3) 1.25 MG (50000 UT) CAPS    Sig: TAKE 1 CAPSULE BY MOUTH ON THE SAME DAY EVERY WEEK AS DIRECTED.    Dispense:  12 capsule    Refill:  1   No orders of the defined types were placed in this encounter.   Follow up plan: Return in about 1 year (around 06/21/2020) for annual exam, prior fasting for blood work.  [name], MD

## 2019-06-22 NOTE — Assessment & Plan Note (Signed)
Acute anxiety attack yesterday despite daily sertraline. Rare episodes. Will trial lorazepam. Reviewed controlled substance, habit forming nature of med. Will Rx #10 tablets. Update with effect.

## 2019-06-22 NOTE — Assessment & Plan Note (Addendum)
Stable on weekly replacement.

## 2019-06-22 NOTE — Assessment & Plan Note (Addendum)
Increase replacement to MWF dosing

## 2019-06-22 NOTE — Patient Instructions (Addendum)
Try lorazepam as needed for anxiety attack.  Take vitamin B12 MWF.  Blood work returned looking good. We will order bone density scan for next year.  Good to see you today, call us with questions.  Health Maintenance for Postmenopausal Women Menopause is a normal process in which your ability to get pregnant comes to an end. This process happens slowly over many months or years, usually between the ages of 78 and 83. Menopause is complete when you have missed your menstrual periods for 12 months. It is important to talk with your health care provider about some of the most common conditions that affect women after menopause (postmenopausal women). These include heart disease, cancer, and bone loss (osteoporosis). Adopting a healthy lifestyle and getting preventive care can help to promote your health and wellness. The actions you take can also lower your chances of developing some of these common conditions. What should I know about menopause? During menopause, you may get a number of symptoms, such as:  Hot flashes. These can be moderate or severe.  Night sweats.  Decrease in sex drive.  Mood swings.  Headaches.  Tiredness.  Irritability.  Memory problems.  Insomnia. Choosing to treat or not to treat these symptoms is a decision that you make with your health care provider. Do I need hormone replacement therapy?  Hormone replacement therapy is effective in treating symptoms that are caused by menopause, such as hot flashes and night sweats.  Hormone replacement carries certain risks, especially as you become older. If you are thinking about using estrogen or estrogen with progestin, discuss the benefits and risks with your health care provider. What is my risk for heart disease and stroke? The risk of heart disease, heart attack, and stroke increases as you age. One of the causes may be a change in the body's hormones during menopause. This can affect how your body uses dietary  fats, triglycerides, and cholesterol. Heart attack and stroke are medical emergencies. There are many things that you can do to help prevent heart disease and stroke. Watch your blood pressure  High blood pressure causes heart disease and increases the risk of stroke. This is more likely to develop in people who have high blood pressure readings, are of African descent, or are overweight.  Have your blood pressure checked: ? Every 3-5 years if you are 47-75 years of age. ? Every year if you are 47 years old or older. Eat a healthy diet   Eat a diet that includes plenty of vegetables, fruits, low-fat dairy products, and lean protein.  Do not eat a lot of foods that are high in solid fats, added sugars, or sodium. Get regular exercise Get regular exercise. This is one of the most important things you can do for your health. Most adults should:  Try to exercise for at least 150 minutes each week. The exercise should increase your heart rate and make you sweat (moderate-intensity exercise).  Try to do strengthening exercises at least twice each week. Do these in addition to the moderate-intensity exercise.  Spend less time sitting. Even light physical activity can be beneficial. Other tips  Work with your health care provider to achieve or maintain a healthy weight.  Do not use any products that contain nicotine or tobacco, such as cigarettes, e-cigarettes, and chewing tobacco. If you need help quitting, ask your health care provider.  Know your numbers. Ask your health care provider to check your cholesterol and your blood sugar (glucose). Continue to have  your blood tested as directed by your health care provider. Do I need screening for cancer? Depending on your health history and family history, you may need to have cancer screening at different stages of your life. This may include screening for:  Breast cancer.  Cervical cancer.  Lung cancer.  Colorectal cancer. What is my  risk for osteoporosis? After menopause, you may be at increased risk for osteoporosis. Osteoporosis is a condition in which bone destruction happens more quickly than new bone creation. To help prevent osteoporosis or the bone fractures that can happen because of osteoporosis, you may take the following actions:  If you are 95-80 years old, get at least 1,000 mg of calcium and at least 600 mg of vitamin D per day.  If you are older than age 31 but younger than age 32, get at least 1,200 mg of calcium and at least 600 mg of vitamin D per day.  If you are older than age 67, get at least 1,200 mg of calcium and at least 800 mg of vitamin D per day. Smoking and drinking excessive alcohol increase the risk of osteoporosis. Eat foods that are rich in calcium and vitamin D, and do weight-bearing exercises several times each week as directed by your health care provider. How does menopause affect my mental health? Depression may occur at any age, but it is more common as you become older. Common symptoms of depression include:  Low or sad mood.  Changes in sleep patterns.  Changes in appetite or eating patterns.  Feeling an overall lack of motivation or enjoyment of activities that you previously enjoyed.  Frequent crying spells. Talk with your health care provider if you think that you are experiencing depression. General instructions See your health care provider for regular wellness exams and vaccines. This may include:  Scheduling regular health, dental, and eye exams.  Getting and maintaining your vaccines. These include: ? Influenza vaccine. Get this vaccine each year before the flu season begins. ? Pneumonia vaccine. ? Shingles vaccine. ? Tetanus, diphtheria, and pertussis (Tdap) booster vaccine. Your health care provider may also recommend other immunizations. Tell your health care provider if you have ever been abused or do not feel safe at home. Summary  Menopause is a normal  process in which your ability to get pregnant comes to an end.  This condition causes hot flashes, night sweats, decreased interest in sex, mood swings, headaches, or lack of sleep.  Treatment for this condition may include hormone replacement therapy.  Take actions to keep yourself healthy, including exercising regularly, eating a healthy diet, watching your weight, and checking your blood pressure and blood sugar levels.  Get screened for cancer and depression. Make sure that you are up to date with all your vaccines. This information is not intended to replace advice given to you by your health care provider. Make sure you discuss any questions you have with your health care provider. Document Released: 12/13/2005 Document Revised: 10/14/2018 Document Reviewed: 10/14/2018 Elsevier Patient Education  2020 [employer].

## 2019-07-02 ENCOUNTER — Other Ambulatory Visit: Payer: Self-pay | Admitting: *Deleted

## 2019-07-02 MED ORDER — SERTRALINE HCL 50 MG PO TABS: 50.0000 mg | ORAL_TABLET | Freq: Every day | ORAL | 3 refills | Status: DC

## 2019-08-04 ENCOUNTER — Encounter: Payer: Self-pay | Admitting: Primary Care

## 2019-08-04 ENCOUNTER — Ambulatory Visit (INDEPENDENT_AMBULATORY_CARE_PROVIDER_SITE_OTHER): Payer: BC Managed Care – PPO | Admitting: Primary Care

## 2019-08-04 VITALS — Temp 97.1°F | Wt 169.0 lb

## 2019-08-04 DIAGNOSIS — R059 Cough, unspecified: Secondary | ICD-10-CM

## 2019-08-04 DIAGNOSIS — K120 Recurrent oral aphthae: Secondary | ICD-10-CM

## 2019-08-04 DIAGNOSIS — R05 Cough: Secondary | ICD-10-CM | POA: Diagnosis not present

## 2019-08-04 MED ORDER — TRIAMCINOLONE ACETONIDE 0.1 % MT PSTE: 1.0000 "application " | PASTE | Freq: Two times a day (BID) | OROMUCOSAL | 0 refills | Status: DC

## 2019-08-04 NOTE — Assessment & Plan Note (Signed)
Difficult to assess via video but HPI resembles more of a canker sore rather than oral ulcer.  Regardless, she's had no resolve with OTC treatment. Rx for triamcinolone paste sent to pharmacy. She will update.  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

## 2019-08-04 NOTE — Patient Instructions (Signed)
Go to [facility] for Covid-19 testing.  You can apply the triamcinolone paste twice daily for about one week.  Please notify us if no improvement.  It was a pleasure meeting you! Allie Bossier, NP-C
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
## 2019-08-05 ENCOUNTER — Other Ambulatory Visit: Payer: Self-pay

## 2019-08-05 DIAGNOSIS — Z20822 Contact with and (suspected) exposure to covid-19: Secondary | ICD-10-CM

## 2019-08-06 LAB — NOVEL CORONAVIRUS, NAA: SARS-CoV-2, NAA: NOT DETECTED

## 2019-09-10 ENCOUNTER — Telehealth: Payer: Self-pay

## 2019-09-10 NOTE — Telephone Encounter (Signed)
Discussed benefits w/pt.  She has "mastercard" that pays for everything except $25 for her Prolia.  Pt to have Prolia shipped to [place].

## 2019-09-14 NOTE — Telephone Encounter (Signed)
[facility] left v/m (person calling did not leave their name) requesting cb to schedule a delivery to [facility].

## 2019-09-15 ENCOUNTER — Telehealth: Payer: Self-pay

## 2019-09-15 MED ORDER — PROLIA 60 MG/ML ~~LOC~~ SOSY: PREFILLED_SYRINGE | SUBCUTANEOUS | 1 refills | Status: DC

## 2019-09-15 NOTE — Telephone Encounter (Signed)
Returned call to [employer], [phone],  Ok'd delivery of Prolia to our office.  Confirmation#[id number].  No refills left.  Will need new Rx for future injections.

## 2019-09-15 NOTE — Telephone Encounter (Signed)
See 09-15-2019 phone note.

## 2019-09-15 NOTE — Addendum Note (Signed)
Addended by: [name] on: 09/15/2019 02:29 PM   Modules accepted: Orders

## 2019-09-15 NOTE — Telephone Encounter (Addendum)
New  Rx sent in to [facility]

## 2019-09-16 ENCOUNTER — Ambulatory Visit (INDEPENDENT_AMBULATORY_CARE_PROVIDER_SITE_OTHER): Payer: BC Managed Care – PPO | Admitting: Family Medicine

## 2019-09-16 ENCOUNTER — Other Ambulatory Visit: Payer: Self-pay

## 2019-09-16 ENCOUNTER — Encounter: Payer: Self-pay | Admitting: Family Medicine

## 2019-09-16 VITALS — BP 120/62 | HR 83 | Temp 98.4°F | Ht 63.0 in | Wt 167.3 lb

## 2019-09-16 DIAGNOSIS — Z23 Encounter for immunization: Secondary | ICD-10-CM | POA: Diagnosis not present

## 2019-09-16 DIAGNOSIS — F410 Panic disorder [episodic paroxysmal anxiety] without agoraphobia: Secondary | ICD-10-CM | POA: Diagnosis not present

## 2019-09-16 DIAGNOSIS — R198 Other specified symptoms and signs involving the digestive system and abdomen: Secondary | ICD-10-CM | POA: Diagnosis not present

## 2019-09-16 LAB — CBC WITH DIFFERENTIAL/PLATELET
Basophils Absolute: 0.1 10*3/uL (ref 0.0–0.1)
Basophils Relative: 1.2 % (ref 0.0–3.0)
Eosinophils Absolute: 0.3 10*3/uL (ref 0.0–0.7)
Eosinophils Relative: 3.3 % (ref 0.0–5.0)
HCT: 42.4 % (ref 36.0–46.0)
Hemoglobin: 13.9 g/dL (ref 12.0–15.0)
Lymphocytes Relative: 17.8 % (ref 12.0–46.0)
Lymphs Abs: 1.8 10*3/uL (ref 0.7–4.0)
MCHC: 32.8 g/dL (ref 30.0–36.0)
MCV: 88.3 fl (ref 78.0–100.0)
Monocytes Absolute: 0.8 10*3/uL (ref 0.1–1.0)
Monocytes Relative: 7.8 % (ref 3.0–12.0)
Neutro Abs: 7.1 10*3/uL (ref 1.4–7.7)
Neutrophils Relative %: 69.9 % (ref 43.0–77.0)
Platelets: 394 10*3/uL (ref 150.0–400.0)
RBC: 4.8 Mil/uL (ref 3.87–5.11)
RDW: 13.8 % (ref 11.5–15.5)
WBC: 10.1 10*3/uL (ref 4.0–10.5)

## 2019-09-16 LAB — TSH: TSH: 2.08 u[IU]/mL (ref 0.35–4.50)

## 2019-09-16 LAB — COMPREHENSIVE METABOLIC PANEL
ALT: 10 U/L (ref 0–35)
AST: 18 U/L (ref 0–37)
Albumin: 4.4 g/dL (ref 3.5–5.2)
Alkaline Phosphatase: 69 U/L (ref 39–117)
BUN: 14 mg/dL (ref 6–23)
CO2: 28 mEq/L (ref 19–32)
Calcium: 9.6 mg/dL (ref 8.4–10.5)
Chloride: 103 mEq/L (ref 96–112)
Creatinine, Ser: 1.01 mg/dL (ref 0.40–1.20)
GFR: 56.18 mL/min — ABNORMAL LOW (ref >60.00)
Glucose, Bld: 86 mg/dL (ref 70–99)
Potassium: 4.2 mEq/L (ref 3.5–5.1)
Sodium: 137 mEq/L (ref 135–145)
Total Bilirubin: 0.4 mg/dL (ref 0.2–1.2)
Total Protein: 7.4 g/dL (ref 6.0–8.3)

## 2019-09-16 MED ORDER — LORAZEPAM 0.5 MG PO TABS: 0.5000 mg | ORAL_TABLET | Freq: Two times a day (BID) | ORAL | 0 refills | Status: DC | PRN

## 2019-09-16 MED ORDER — CITALOPRAM HYDROBROMIDE 20 MG PO TABS: 20.0000 mg | ORAL_TABLET | Freq: Every day | ORAL | 2 refills | Status: DC

## 2019-09-16 NOTE — Progress Notes (Addendum)
This visit was conducted in person.  BP 120/62 (BP Location: Left Arm, Patient Position: Sitting, Cuff Size: Normal)   Pulse 83   Temp 98.4 F (36.9 C) (Temporal)   Ht 5\' 3"  (1.6 m)   Wt 167 lb 5 oz (75.9 kg)   LMP 02/14/2005 (Approximate)   SpO2 95%   BMI 29.64 kg/m    CC: ?panic attack Subjective:    Patient ID: Sabrina Wright, female    DOB: 1961/06/29, 58 y.o.   MRN: [id number]  HPI: Sabrina Wright is a 58 y.o. female presenting on 09/16/2019 for Panic Attack (C/o panic attacks more frequently. )   2d ago suffered episode that started with rectal pressure that led to sweating, chest discomfort, palpitations, dyspnea, body stiffness, body aches afterwards. This happened while at work so she left early. Vomited on drive home from work. Got home and slept for 17 hours straight after she took ativan and phenergan tablet. No LOC, seizures noted, confusion. Denies new foods. Doesn't think she had any recent egg products recently (h/o egg allergy). No known hemorrhoids.   Previously presumed to be anxiety attacks.   Denies abd pain, fever, blood in stool or urine, bowel changes.  She has had 2 HA that wake her up from sleep (2:30am). She had reassuring head CT 06/2018 at ER (seen for headache and vertigo).  COLONOSCOPY 2017 TAx2, diverticulosis, rpt 5 yrs (Nandigam)     Relevant past medical, surgical, family and social history reviewed and updated as indicated. Interim medical history since our last visit reviewed. Allergies and medications reviewed and updated. Outpatient Medications Prior to Visit  Medication Sig Dispense Refill  . Calcium Carb-Cholecalciferol (CALCIUM 600/VITAMIN D3) 600-800 MG-UNIT TABS Take 2 tablets by mouth daily. 60 tablet   . Cholecalciferol (VITAMIN D3) 1.25 MG (50000 UT) CAPS TAKE 1 CAPSULE BY MOUTH ON THE SAME DAY EVERY WEEK AS DIRECTED. 12 capsule 1  . clobetasol cream (TEMOVATE) [id number] % APPLY 1 APPLICATION TOPICALLY 2 (TWO) TIMES DAILY. APPLY TO  AFFECTED AREA NO MORE THAN 2 WEEKS AT A TIME+ 30 g 0  . denosumab (PROLIA) 60 MG/ML SOSY injection INJECT 60MG  SUBCUTANEOUSLY  EVERY 6 MONTHS (GIVEN AT  PRESCRIBERS OFFICE) 1 mL 1  . meloxicam (MOBIC) 7.5 MG tablet TAKE 1 TABLET BY MOUTH EVERY DAY 30 tablet 6  . Multiple Vitamin (MULTIVITAMIN WITH MINERALS) TABS tablet Take 1 tablet by mouth at bedtime.    . promethazine (PHENERGAN) 25 MG tablet Take 1 tablet (25 mg total) by mouth every 8 (eight) hours as needed for nausea or vomiting. Caution of sedation 20 tablet 0  . SUMAtriptan (IMITREX) 100 MG tablet TAKE 1 TABLET BY MOUTH ONCE FOR 1 DOSE. MAY REPEAT IN 2 HOURS IF HEADACHE PERSISTS OR RECURS. 9 tablet 4  . triamcinolone (KENALOG) 0.1 % paste Use as directed 1 application in the mouth or throat 2 (two) times daily. 5 g 0  . vitamin B-12 (V-R VITAMIN B-12) 500 MCG tablet Take 1 tablet (500 mcg total) by mouth every Monday, Wednesday, and Friday.    Marland Kitchen LORazepam (ATIVAN) 0.5 MG tablet Take 1 tablet (0.5 mg total) by mouth 2 (two) times daily as needed for anxiety. 10 tablet 0  . sertraline (ZOLOFT) 50 MG tablet Take 1 tablet (50 mg total) by mouth daily. 90 tablet 3   No facility-administered medications prior to visit.      Per HPI unless specifically indicated in ROS section below Review of Systems Objective:  BP 120/62 (BP Location: Left Arm, Patient Position: Sitting, Cuff Size: Normal)   Pulse 83   Temp 98.4 F (36.9 C) (Temporal)   Ht 5\' 3"  (1.6 m)   Wt 167 lb 5 oz (75.9 kg)   LMP 02/14/2005 (Approximate)   SpO2 95%   BMI 29.64 kg/m   Wt Readings from Last 3 Encounters:  09/16/19 167 lb 5 oz (75.9 kg)  08/04/19 169 lb (76.7 kg)  06/22/19 169 lb 2 oz (76.7 kg)    Physical Exam Vitals signs and nursing note reviewed.  Constitutional:      General: She is not in acute distress.    Appearance: Normal appearance. She is not ill-appearing.  HENT:     Mouth/Throat:     Mouth: Mucous membranes are moist.     Pharynx:  Oropharynx is clear. No posterior oropharyngeal erythema.  Neck:     Thyroid: No thyromegaly or thyroid tenderness.     Vascular: No carotid bruit.  Cardiovascular:     Rate and Rhythm: Normal rate and regular rhythm.     Pulses: Normal pulses.     Heart sounds: Normal heart sounds. No murmur.  Pulmonary:     Effort: Pulmonary effort is normal. No respiratory distress.     Breath sounds: Normal breath sounds. No wheezing, rhonchi or rales.  Neurological:     General: No focal deficit present.     Mental Status: She is alert.     Comments:  CN 2-12 intact FTN intact EOMI Neg romberg  Psychiatric:        Attention and Perception: Attention normal.        Mood and Affect: Mood is anxious.        Speech: Speech normal.        Behavior: Behavior normal.        Thought Content: Thought content normal.        Cognition and Memory: Cognition normal.       Results for orders placed or performed in visit on 09/16/19  Comprehensive metabolic panel  Result Value Ref Range   Sodium 137 135 - 145 mEq/L   Potassium 4.2 3.5 - 5.1 mEq/L   Chloride 103 96 - 112 mEq/L   CO2 28 19 - 32 mEq/L   Glucose, Bld 86 70 - 99 mg/dL   BUN 14 6 - 23 mg/dL   Creatinine, Ser 1.01 0.40 - 1.20 mg/dL   Total Bilirubin 0.4 0.2 - 1.2 mg/dL   Alkaline Phosphatase 69 39 - 117 U/L   AST 18 0 - 37 U/L   ALT 10 0 - 35 U/L   Total Protein 7.4 6.0 - 8.3 g/dL   Albumin 4.4 3.5 - 5.2 g/dL   GFR 56.18 (L) >60.00 mL/min   Calcium 9.6 8.4 - 10.5 mg/dL  TSH  Result Value Ref Range   TSH 2.08 0.35 - 4.50 uIU/mL  CBC with Differential  Result Value Ref Range   WBC 10.1 4.0 - 10.5 K/uL   RBC 4.80 3.87 - 5.11 Mil/uL   Hemoglobin 13.9 12.0 - 15.0 g/dL   HCT 42.4 36.0 - 46.0 %   MCV 88.3 78.0 - 100.0 fl   MCHC 32.8 30.0 - 36.0 g/dL   RDW 13.8 11.5 - 15.5 %   Platelets 394.0 150.0 - 400.0 K/uL   Neutrophils Relative % 69.9 43.0 - 77.0 %   Lymphocytes Relative 17.8 12.0 - 46.0 %   Monocytes Relative 7.8 3.0 -  12.0 %  Eosinophils Relative 3.3 0.0 - 5.0 %   Basophils Relative 1.2 0.0 - 3.0 %   Neutro Abs 7.1 1.4 - 7.7 K/uL   Lymphs Abs 1.8 0.7 - 4.0 K/uL   Monocytes Absolute 0.8 0.1 - 1.0 K/uL   Eosinophils Absolute 0.3 0.0 - 0.7 K/uL   Basophils Absolute 0.1 0.0 - 0.1 K/uL   Depression screen [facility] 2/9 09/16/2019 06/22/2019 03/04/2018  Decreased Interest 2 0 0  Down, Depressed, Hopeless 1 0 0  PHQ - 2 Score 3 0 0  Altered sleeping 3 2 -  Tired, decreased energy 3 3 -  Change in appetite 2 1 -  Feeling bad or failure about yourself  1 1 -  Trouble concentrating 0 1 -  Moving slowly or fidgety/restless 0 0 -  Suicidal thoughts 0 0 -  PHQ-9 Score 12 8 -    GAD 7 : Generalized Anxiety Score 09/16/2019 06/22/2019  Nervous, Anxious, on Edge 1 1  Control/stop worrying 1 1  Worry too much - different things 1 1  Trouble relaxing 1 1  Restless 0 0  Easily annoyed or irritable 3 0  Afraid - awful might happen 0 1  Total GAD 7 Score 7 5   Assessment & Plan:   Problem List Items Addressed This Visit    Rectal pressure    Episodes start with rectal pressure/discomfort and progress to multiple other symptoms including dyspnea, diaphoresis, racing heart, body stiffness then aches. Unclear cause at this time, ?due to anxiety attack. No known h/o hemorrhoids. No incontinence, nonfocal neurological exam. Check labs today to eval for inflammation, infection, electrolyte imbalance, etc. Consider iFOB although she is UTD on colon cancer screening. ?IBS.       Relevant Orders   Fecal occult blood, imunochemical   Anxiety attack - Primary    Ongoing anxiety worsened by work stressors since 2017 (since she quit smoking), initial improvement on sertraline but now may be worsening - will change SSRI to celexa 20mg  daily, update with effect. Rare lorazepam use - last filled #10 06/2019 - will refill today. She feels it can help.       Relevant Medications   citalopram (CELEXA) 20 MG tablet   LORazepam  (ATIVAN) 0.5 MG tablet   Other Relevant Orders   Comprehensive metabolic panel (Completed)   TSH (Completed)   CBC with Differential (Completed)    Other Visit Diagnoses    Need for influenza vaccination       Relevant Orders   Flu Vaccine MDCK QUAD PF (Completed)       Meds ordered this encounter  Medications  . citalopram (CELEXA) 20 MG tablet    Sig: Take 1 tablet (20 mg total) by mouth daily.    Dispense:  90 tablet    Refill:  2  . LORazepam (ATIVAN) 0.5 MG tablet    Sig: Take 1 tablet (0.5 mg total) by mouth 2 (two) times daily as needed for anxiety.    Dispense:  20 tablet    Refill:  0   Orders Placed This Encounter  Procedures  . Fecal occult blood, imunochemical    Standing Status:   Future    Standing Expiration Date:   09/17/2020  . Flu Vaccine MDCK QUAD PF  . Comprehensive metabolic panel  . TSH  . CBC with Differential    Patient Instructions  Flu shot today Labs today. We will be in touch with results.  Change mood medicine from sertraline to celexa 20mg   daily.    Follow up plan: No follow-ups on file.  [name], MD

## 2019-09-16 NOTE — Patient Instructions (Addendum)
Flu shot today Labs today. We will be in touch with results.  Change mood medicine from sertraline to celexa 20mg  daily.

## 2019-09-18 DIAGNOSIS — R198 Other specified symptoms and signs involving the digestive system and abdomen: Secondary | ICD-10-CM | POA: Insufficient documentation

## 2019-09-18 NOTE — Assessment & Plan Note (Addendum)
Episodes start with rectal pressure/discomfort and progress to multiple other symptoms including dyspnea, diaphoresis, racing heart, body stiffness then aches. Unclear cause at this time, ?due to anxiety attack. No known h/o hemorrhoids. No incontinence, nonfocal neurological exam. Check labs today to eval for inflammation, infection, electrolyte imbalance, etc. Consider iFOB although she is UTD on colon cancer screening. ?IBS.

## 2019-09-18 NOTE — Assessment & Plan Note (Addendum)
Ongoing anxiety worsened by work stressors since 2017 (since she quit smoking), initial improvement on sertraline but now may be worsening - will change SSRI to celexa 20mg  daily, update with effect. Rare lorazepam use - last filled #10 06/2019 - will refill today. She feels it can help.

## 2019-09-21 ENCOUNTER — Other Ambulatory Visit (INDEPENDENT_AMBULATORY_CARE_PROVIDER_SITE_OTHER): Payer: BC Managed Care – PPO

## 2019-09-21 DIAGNOSIS — R198 Other specified symptoms and signs involving the digestive system and abdomen: Secondary | ICD-10-CM

## 2019-09-21 LAB — POC URINALSYSI DIPSTICK (AUTOMATED)
Bilirubin, UA: NEGATIVE
Glucose, UA: NEGATIVE
Ketones, UA: NEGATIVE
Leukocytes, UA: NEGATIVE
Nitrite, UA: NEGATIVE
Protein, UA: NEGATIVE
Spec Grav, UA: >=1.03 — AB (ref 1.010–1.025)
Urobilinogen, UA: 0.2 E.U./dL
pH, UA: 5 (ref 5.0–8.0)

## 2019-09-22 ENCOUNTER — Telehealth: Payer: Self-pay

## 2019-09-22 NOTE — Telephone Encounter (Signed)
Pt had Prolia shipped to [place].  Pt would owe 20% of admin fee, approx. $23.  Pt understands and agrees.

## 2019-09-28 ENCOUNTER — Other Ambulatory Visit: Payer: Self-pay

## 2019-09-28 DIAGNOSIS — Z20822 Contact with and (suspected) exposure to covid-19: Secondary | ICD-10-CM

## 2019-09-30 LAB — NOVEL CORONAVIRUS, NAA: SARS-CoV-2, NAA: NOT DETECTED

## 2019-10-21 ENCOUNTER — Other Ambulatory Visit: Payer: Self-pay | Admitting: Primary Care

## 2019-10-21 ENCOUNTER — Other Ambulatory Visit: Payer: Self-pay | Admitting: Family Medicine

## 2019-10-21 DIAGNOSIS — K120 Recurrent oral aphthae: Secondary | ICD-10-CM

## 2019-10-22 NOTE — Telephone Encounter (Signed)
Meloxicam Last filled:  09/06/19, #30 Last OV:  09/16/19, anxiety Next OV:  06/26/20, CPE

## 2019-10-26 ENCOUNTER — Ambulatory Visit (INDEPENDENT_AMBULATORY_CARE_PROVIDER_SITE_OTHER): Payer: BC Managed Care – PPO | Admitting: *Deleted

## 2019-10-26 DIAGNOSIS — M810 Age-related osteoporosis without current pathological fracture: Secondary | ICD-10-CM | POA: Diagnosis not present

## 2019-10-26 MED ORDER — DENOSUMAB 60 MG/ML ~~LOC~~ SOSY
60.0000 mg | PREFILLED_SYRINGE | Freq: Once | SUBCUTANEOUS | Status: AC
  Administered 2019-10-26: 16:00:00 60 mg via SUBCUTANEOUS

## 2019-10-26 NOTE — Progress Notes (Signed)
Per orders of Dr. Gutierrez, injection of Prolia given by Slyvia Lartigue Simpson. °Patient tolerated injection well. °

## 2020-01-14 ENCOUNTER — Encounter: Payer: Self-pay | Admitting: Family Medicine

## 2020-01-19 ENCOUNTER — Telehealth: Payer: Self-pay

## 2020-01-19 NOTE — Telephone Encounter (Signed)
Pt's Prolia has arrived.  Pt had shipped to our office. Pt due 04-26-20.

## 2020-01-19 NOTE — Telephone Encounter (Signed)
Notified pt her Prolia has arrived from her pharmacy and we will contact her in May to schedule.  Pt to have her first Covid vaccine 01-20-20.

## 2020-01-20 ENCOUNTER — Ambulatory Visit: Payer: BC Managed Care – PPO | Attending: Internal Medicine

## 2020-01-20 DIAGNOSIS — Z23 Encounter for immunization: Secondary | ICD-10-CM

## 2020-01-20 NOTE — Progress Notes (Signed)
   Covid-19 Vaccination Clinic  Name:  Sabrina Wright    MRN: [id number] DOB: [birth year]  01/20/2020  [name] was observed post Covid-19 immunization for 15 minutes without incident. She was provided with Vaccine Information Sheet and instruction to access the V-Safe system.   [name] was instructed to call 911 with any severe reactions post vaccine: Marland Kitchen Difficulty breathing  . Swelling of face and throat  . A fast heartbeat  . A bad rash all over body  . Dizziness and weakness   Immunizations Administered    Name Date Dose VIS Date Route   Pfizer COVID-19 Vaccine 01/20/2020  1:50 PM 0.3 mL 10/15/2019 Intramuscular   Manufacturer: [place]   Lot: [id number]   [place]: [id number]

## 2020-01-22 ENCOUNTER — Other Ambulatory Visit: Payer: Self-pay | Admitting: Family Medicine

## 2020-02-14 ENCOUNTER — Ambulatory Visit: Payer: BC Managed Care – PPO | Attending: Internal Medicine

## 2020-02-14 DIAGNOSIS — Z23 Encounter for immunization: Secondary | ICD-10-CM

## 2020-02-14 NOTE — Progress Notes (Signed)
   Covid-19 Vaccination Clinic  Name:  Sabrina Wright    MRN: [id number] DOB: 1960-11-19  02/14/2020  Sabrina Wright was observed post Covid-19 immunization for 15 minutes without incident. She was provided with Vaccine Information Sheet and instruction to access the V-Safe system.   Sabrina Wright was instructed to call 911 with any severe reactions post vaccine: Marland Kitchen Difficulty breathing  . Swelling of face and throat  . A fast heartbeat  . A bad rash all over body  . Dizziness and weakness   Immunizations Administered    Name Date Dose VIS Date Route   Pfizer COVID-19 Vaccine 02/14/2020  2:12 PM 0.3 mL 10/15/2019 Intramuscular   Manufacturer: [place]   Lot: [id number]   [place]: [id number]

## 2020-02-23 ENCOUNTER — Other Ambulatory Visit: Payer: Self-pay | Admitting: Family Medicine

## 2020-02-23 DIAGNOSIS — Z1231 Encounter for screening mammogram for malignant neoplasm of breast: Secondary | ICD-10-CM

## 2020-03-08 ENCOUNTER — Telehealth: Payer: Self-pay

## 2020-03-08 DIAGNOSIS — M810 Age-related osteoporosis without current pathological fracture: Secondary | ICD-10-CM

## 2020-03-08 NOTE — Telephone Encounter (Signed)
Pt's Prolia was shipped from her specialty [facility] and has arrived and in our refrigerator.  Pt would owe approx $25 for admin fee.  Pt had 2d Covid vaccine 02-14-20.

## 2020-03-10 NOTE — Telephone Encounter (Signed)
Pt is not due for prolia until 6/23. Too soon to contact pt for labs.

## 2020-04-07 NOTE — Telephone Encounter (Signed)
Left msg with pt's husband to have pt return call.   Pt will owe about $25

## 2020-04-11 ENCOUNTER — Telehealth: Payer: Self-pay | Admitting: Family Medicine

## 2020-04-11 NOTE — Telephone Encounter (Signed)
Received notice from [facility] - pt may have been receiving both sertraline and celexa for the past 6 months?  Sertraline was changed to celexa in 09/2019.  plz ensure patient is only taking celexa 20mg  daily, then call [facility] pharmacy to clarify. Form in LIsa's box.

## 2020-04-12 NOTE — Telephone Encounter (Signed)
Spoke with pt confirming she is only taking Celexa.  She says yes and that [facility] is no longer taking [facility].   Spoke with [facility] notifying them.  Pharmacists says she will document in pt's info.

## 2020-04-13 NOTE — Telephone Encounter (Signed)
LVM  Pt will owe about $25.

## 2020-04-14 NOTE — Telephone Encounter (Signed)
LVM

## 2020-04-17 ENCOUNTER — Other Ambulatory Visit: Payer: Self-pay

## 2020-04-17 ENCOUNTER — Ambulatory Visit
Admission: RE | Admit: 2020-04-17 | Discharge: 2020-04-17 | Disposition: A | Discharge status: Home / Self Care | Payer: BC Managed Care – PPO | Source: Ambulatory Visit | Attending: Family Medicine | Admitting: Family Medicine

## 2020-04-17 DIAGNOSIS — Z1231 Encounter for screening mammogram for malignant neoplasm of breast: Secondary | ICD-10-CM

## 2020-04-17 NOTE — Telephone Encounter (Signed)
Pt scheduled for 6/16 for lab and 6/24 for prolia inj.  Placed BMP order

## 2020-04-17 NOTE — Addendum Note (Signed)
Addended by: Randall An on: 04/17/2020 12:04 PM   Modules accepted: Orders

## 2020-04-18 LAB — HM MAMMOGRAPHY

## 2020-04-19 ENCOUNTER — Other Ambulatory Visit (INDEPENDENT_AMBULATORY_CARE_PROVIDER_SITE_OTHER): Payer: BC Managed Care – PPO

## 2020-04-19 ENCOUNTER — Encounter: Payer: Self-pay | Admitting: Family Medicine

## 2020-04-19 DIAGNOSIS — M810 Age-related osteoporosis without current pathological fracture: Secondary | ICD-10-CM | POA: Diagnosis not present

## 2020-04-20 LAB — BASIC METABOLIC PANEL
BUN: 13 mg/dL (ref 6–23)
CO2: 26 mEq/L (ref 19–32)
Calcium: 9.2 mg/dL (ref 8.4–10.5)
Chloride: 101 mEq/L (ref 96–112)
Creatinine, Ser: 0.73 mg/dL (ref 0.40–1.20)
GFR: 81.54 mL/min (ref >60.00)
Glucose, Bld: 91 mg/dL (ref 70–99)
Potassium: 3.8 mEq/L (ref 3.5–5.1)
Sodium: 134 mEq/L — ABNORMAL LOW (ref 135–145)

## 2020-04-20 NOTE — Telephone Encounter (Signed)
Ca normal and CrCl is 99.42. Ok for pt to get inj on 6/24.

## 2020-04-27 ENCOUNTER — Other Ambulatory Visit: Payer: Self-pay

## 2020-04-27 ENCOUNTER — Ambulatory Visit (INDEPENDENT_AMBULATORY_CARE_PROVIDER_SITE_OTHER): Payer: BC Managed Care – PPO

## 2020-04-27 DIAGNOSIS — M810 Age-related osteoporosis without current pathological fracture: Secondary | ICD-10-CM

## 2020-04-27 MED ORDER — DENOSUMAB 60 MG/ML ~~LOC~~ SOSY
60.0000 mg | PREFILLED_SYRINGE | Freq: Once | SUBCUTANEOUS | Status: AC
  Administered 2020-04-27: 60 mg via SUBCUTANEOUS

## 2020-04-27 NOTE — Progress Notes (Signed)
Per orders of Dr. Duncan, injection of Prolia given by Angla Delahunt. Patient tolerated injection well.  

## 2020-05-04 ENCOUNTER — Other Ambulatory Visit: Payer: Self-pay | Admitting: Family Medicine

## 2020-05-04 NOTE — Telephone Encounter (Signed)
Meloxicam Last filled:  04/06/20, #30 Last OV:  09/16/19, panic attack Next OV:  06/26/20, CPE

## 2020-05-19 ENCOUNTER — Encounter: Payer: Self-pay | Admitting: Family Medicine

## 2020-06-01 ENCOUNTER — Other Ambulatory Visit: Payer: Self-pay

## 2020-06-01 ENCOUNTER — Encounter: Payer: Self-pay | Admitting: Family Medicine

## 2020-06-01 ENCOUNTER — Ambulatory Visit (INDEPENDENT_AMBULATORY_CARE_PROVIDER_SITE_OTHER): Payer: BC Managed Care – PPO | Admitting: Family Medicine

## 2020-06-01 VITALS — BP 130/60 | HR 70 | Temp 98.3°F | Ht 63.0 in | Wt 176.0 lb

## 2020-06-01 DIAGNOSIS — R238 Other skin changes: Secondary | ICD-10-CM | POA: Diagnosis not present

## 2020-06-01 MED ORDER — TRAMADOL HCL 50 MG PO TABS: 50.0000 mg | ORAL_TABLET | Freq: Four times a day (QID) | ORAL | 0 refills | Status: DC | PRN

## 2020-06-01 MED ORDER — VALACYCLOVIR HCL 1 G PO TABS: 1000.0000 mg | ORAL_TABLET | Freq: Three times a day (TID) | ORAL | 0 refills | Status: DC
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
## 2020-06-01 NOTE — Assessment & Plan Note (Signed)
Recurrent vesicular rash.Marland Kitchen appears more consistent with herpes simplex virus than shingels but no blister today to culture.  Will treat with valacyclovir and pain control.

## 2020-06-01 NOTE — Patient Instructions (Addendum)
Start course of valacyclovir.  Start tramadol as needed for pain.  Call if pain not controlled.  Keep area covered.

## 2020-06-03 ENCOUNTER — Other Ambulatory Visit: Payer: Self-pay | Admitting: Family Medicine

## 2020-06-20 ENCOUNTER — Other Ambulatory Visit: Payer: Self-pay | Admitting: Family Medicine

## 2020-06-22 ENCOUNTER — Other Ambulatory Visit (INDEPENDENT_AMBULATORY_CARE_PROVIDER_SITE_OTHER): Payer: BC Managed Care – PPO

## 2020-06-22 ENCOUNTER — Other Ambulatory Visit: Payer: Self-pay

## 2020-06-22 ENCOUNTER — Other Ambulatory Visit: Payer: Self-pay | Admitting: Family Medicine

## 2020-06-22 DIAGNOSIS — M810 Age-related osteoporosis without current pathological fracture: Secondary | ICD-10-CM

## 2020-06-22 DIAGNOSIS — Z13220 Encounter for screening for lipoid disorders: Secondary | ICD-10-CM | POA: Diagnosis not present

## 2020-06-22 DIAGNOSIS — E559 Vitamin D deficiency, unspecified: Secondary | ICD-10-CM

## 2020-06-22 DIAGNOSIS — E538 Deficiency of other specified B group vitamins: Secondary | ICD-10-CM

## 2020-06-22 LAB — COMPREHENSIVE METABOLIC PANEL
ALT: 10 U/L (ref 0–35)
AST: 16 U/L (ref 0–37)
Albumin: 3.9 g/dL (ref 3.5–5.2)
Alkaline Phosphatase: 62 U/L (ref 39–117)
BUN: 15 mg/dL (ref 6–23)
CO2: 27 mEq/L (ref 19–32)
Calcium: 8.9 mg/dL (ref 8.4–10.5)
Chloride: 107 mEq/L (ref 96–112)
Creatinine, Ser: 0.79 mg/dL (ref 0.40–1.20)
GFR: 74.4 mL/min (ref >60.00)
Glucose, Bld: 104 mg/dL — ABNORMAL HIGH (ref 70–99)
Potassium: 4 mEq/L (ref 3.5–5.1)
Sodium: 140 mEq/L (ref 135–145)
Total Bilirubin: 0.6 mg/dL (ref 0.2–1.2)
Total Protein: 6.7 g/dL (ref 6.0–8.3)

## 2020-06-22 LAB — LIPID PANEL
Cholesterol: 164 mg/dL (ref 0–200)
HDL: 51.1 mg/dL (ref >39.00)
LDL Cholesterol: 102 mg/dL — ABNORMAL HIGH (ref 0–99)
NonHDL: 112.88
Total CHOL/HDL Ratio: 3
Triglycerides: 52 mg/dL (ref 0.0–149.0)
VLDL: 10.4 mg/dL (ref 0.0–40.0)

## 2020-06-22 LAB — VITAMIN D 25 HYDROXY (VIT D DEFICIENCY, FRACTURES): VITD: 78.5 ng/mL (ref 30.00–100.00)

## 2020-06-22 LAB — VITAMIN B12: Vitamin B-12: 359 pg/mL (ref 211–911)

## 2020-06-26 ENCOUNTER — Encounter: Payer: BC Managed Care – PPO | Admitting: Family Medicine

## 2020-07-03 ENCOUNTER — Ambulatory Visit (INDEPENDENT_AMBULATORY_CARE_PROVIDER_SITE_OTHER): Payer: BC Managed Care – PPO | Admitting: Family Medicine

## 2020-07-03 ENCOUNTER — Encounter: Payer: Self-pay | Admitting: Family Medicine

## 2020-07-03 VITALS — BP 120/80 | HR 64 | Temp 98.0°F | Ht 63.5 in | Wt 173.4 lb

## 2020-07-03 DIAGNOSIS — G43109 Migraine with aura, not intractable, without status migrainosus: Secondary | ICD-10-CM

## 2020-07-03 DIAGNOSIS — M810 Age-related osteoporosis without current pathological fracture: Secondary | ICD-10-CM

## 2020-07-03 DIAGNOSIS — Z0000 Encounter for general adult medical examination without abnormal findings: Secondary | ICD-10-CM

## 2020-07-03 DIAGNOSIS — Z853 Personal history of malignant neoplasm of breast: Secondary | ICD-10-CM

## 2020-07-03 DIAGNOSIS — R002 Palpitations: Secondary | ICD-10-CM

## 2020-07-03 DIAGNOSIS — Z23 Encounter for immunization: Secondary | ICD-10-CM | POA: Diagnosis not present

## 2020-07-03 DIAGNOSIS — M7702 Medial epicondylitis, left elbow: Secondary | ICD-10-CM

## 2020-07-03 DIAGNOSIS — E559 Vitamin D deficiency, unspecified: Secondary | ICD-10-CM

## 2020-07-03 DIAGNOSIS — F410 Panic disorder [episodic paroxysmal anxiety] without agoraphobia: Secondary | ICD-10-CM

## 2020-07-03 DIAGNOSIS — I447 Left bundle-branch block, unspecified: Secondary | ICD-10-CM

## 2020-07-03 DIAGNOSIS — M792 Neuralgia and neuritis, unspecified: Secondary | ICD-10-CM

## 2020-07-03 DIAGNOSIS — E538 Deficiency of other specified B group vitamins: Secondary | ICD-10-CM

## 2020-07-03 DIAGNOSIS — Z87891 Personal history of nicotine dependence: Secondary | ICD-10-CM

## 2020-07-03 MED ORDER — SUMATRIPTAN SUCCINATE 100 MG PO TABS
ORAL_TABLET | ORAL | 3 refills | Status: DC
Start: 2020-07-03 — End: 2021-03-02

## 2020-07-03 MED ORDER — CITALOPRAM HYDROBROMIDE 20 MG PO TABS
20.0000 mg | ORAL_TABLET | Freq: Every day | ORAL | 3 refills | Status: DC
Start: 2020-07-03 — End: 2021-06-12

## 2020-07-03 MED ORDER — ALPRAZOLAM 0.5 MG PO TABS
0.2500 mg | ORAL_TABLET | Freq: Two times a day (BID) | ORAL | 1 refills | Status: DC | PRN
Start: 2020-07-03 — End: 2021-01-30

## 2020-07-03 MED ORDER — CYANOCOBALAMIN 500 MCG PO TABS: 500.0000 ug | ORAL_TABLET | Freq: Every day | ORAL | Status: DC

## 2020-07-03 NOTE — Progress Notes (Signed)
This visit was conducted in person.  BP 120/80 (BP Location: Right Arm, Patient Position: Sitting, Cuff Size: Normal)   Pulse 64   Temp 98 F (36.7 C) (Temporal)   Ht 5' 3.5" (1.613 m)   Wt 173 lb 7 oz (78.7 kg)   LMP 02/14/2005 (Approximate)   SpO2 96%   BMI 30.24 kg/m    CC: CPE Subjective:    Patient ID: Sabrina Wright, female    DOB: 06-May-1961, 59 y.o.   MRN: [id number]  HPI: Sabrina Wright is a 59 y.o. female presenting on 07/03/2020 for Annual Exam   Rough last few months - ongoing L arm pain managed with tramadol - now seeing worker's comp planned PT, increased tramadol. Takes meloxicam 15mg  daily. Requests new letter with restrictions for work.   Increasing panic attacks described as tachypalpitations with shortness of breath.   Increasing headaches - can wake up at 2am with pounding headache to occiput. Ongoing for 5-6 months. This feels different than her migraines. Still manages with imitrex. No fmhx brain aneurysms.   Preventative: COLONOSCOPY 2017 TAx2, diverticulosis, rpt 5 yrs (Nandigam) Well woman exam - Normal pap 03/2018 rpt 3 yrs. No pelvic pain or vaginal bleeding. Mammogram Birads1 04/2020,h/o L breast cancer. She does breast exams monthly at home. Lung cancer screening - to discuss at next OV Osteoporosis - DEXA 02/2017 T -3.2 hip, -2.9 spine. off boniva for the past year. On prolia since 10/2018. Notes some L hip pain after prolia shots - will let me know if this persists for imaging. latest shot 04/2020.  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 Tdap 02/2015  Pneumovax 02/2015 COVID vaccine - [place] 01/2020, 02/2020  shingrix - discussed. First shot today  Seat belt use discussed Sunscreen use discussed. No changing moles on skin.  Ex smoker - quit 2018! ~20 PY smoker Alcohol - rare  Dentist q2 yrs Eye exam yearly -  new glasses  Lives with husband and 2 daughters, 2 dogs and 2 cats  Occupation:  Animal nutritionist at [employer]  Edu: HS  Activity: walks daily Diet: good water, fruits/vegetables daily, red meat 2x/wk, fish rarely     Relevant past medical, surgical, family and social history reviewed and updated as indicated. Interim medical history since our last visit reviewed. Allergies and medications reviewed and updated. Outpatient Medications Prior to Visit  Medication Sig Dispense Refill  . Calcium Carb-Cholecalciferol (CALCIUM 600/VITAMIN D3) 600-800 MG-UNIT TABS Take 2 tablets by mouth daily. 60 tablet   . clobetasol cream (TEMOVATE) 5.46 % APPLY 1 APPLICATION TOPICALLY 2 (TWO) TIMES DAILY. APPLY TO AFFECTED AREA NO MORE THAN 2 WEEKS AT A TIME+ 30 g 0  . denosumab (PROLIA) 60 MG/ML SOSY injection INJECT 60MG  SUBCUTANEOUSLY  EVERY 6 MONTHS (GIVEN AT  PRESCRIBERS OFFICE) 1 mL 1  . meloxicam (MOBIC) 7.5 MG tablet TAKE 1 TABLET BY MOUTH EVERY DAY (Patient taking differently: Takes 2 tablets daily) 30 tablet 6  . Multiple Vitamin (MULTIVITAMIN WITH MINERALS) TABS tablet Take 1 tablet by mouth at bedtime.    . promethazine (PHENERGAN) 25 MG tablet Take 1 tablet (25 mg total) by mouth every 8 (eight) hours as needed for nausea or vomiting. Caution of sedation 20 tablet 0  . Cholecalciferol (VITAMIN D3) 1.25 MG (50000 UT) CAPS TAKE 1 CAPSULE BY MOUTH ON THE SAME DAY EVERY WEEK AS DIRECTED. 12 capsule 0  . citalopram (CELEXA) 20  MG tablet TAKE 1 TABLET BY MOUTH EVERY DAY 90 tablet 2  . LORazepam (ATIVAN) 0.5 MG tablet Take 1 tablet (0.5 mg total) by mouth 2 (two) times daily as needed for anxiety. 20 tablet 0  . SUMAtriptan (IMITREX) 100 MG tablet TAKE 1 TABLET BY MOUTH ONCE FOR 1 DOSE. MAY REPEAT IN 2 HOURS IF HEADACHE PERSISTS OR RECURS. 9 tablet 2  . vitamin B-12 (V-R VITAMIN B-12) 500 MCG tablet Take 1 tablet (500 mcg total) by mouth every Monday, Wednesday, and Friday.    . traMADol (ULTRAM) 50 MG tablet Take 1 tablet (50 mg total) by mouth every 6 (six) hours as needed.  30 tablet 0  . triamcinolone (KENALOG) 0.1 % paste Use as directed 1 application in the mouth or throat 2 (two) times daily. 5 g 0  . valACYclovir (VALTREX) 1000 MG tablet Take 1 tablet (1,000 mg total) by mouth 3 (three) times daily. 21 tablet 0   No facility-administered medications prior to visit.     Per HPI unless specifically indicated in ROS section below Review of Systems  Constitutional: Negative for activity change, appetite change, chills, fatigue, fever and unexpected weight change.  HENT: Negative for hearing loss.   Eyes: Negative for visual disturbance.  Respiratory: Negative for cough, chest tightness, shortness of breath and wheezing.   Cardiovascular: Negative for chest pain, palpitations and leg swelling.  Gastrointestinal: Negative for abdominal distention, abdominal pain, blood in stool, constipation, diarrhea, nausea and vomiting.  Genitourinary: Negative for difficulty urinating and hematuria.  Musculoskeletal: Negative for arthralgias, myalgias and neck pain.  Skin: Negative for rash.  Neurological: Positive for light-headedness and headaches (see HPI). Negative for dizziness, seizures, syncope and facial asymmetry.  Hematological: Negative for adenopathy. Does not bruise/bleed easily.  Psychiatric/Behavioral: Negative for dysphoric mood. The patient is not nervous/anxious.    Objective:  BP 120/80 (BP Location: Right Arm, Patient Position: Sitting, Cuff Size: Normal)   Pulse 64   Temp 98 F (36.7 C) (Temporal)   Ht 5' 3.5" (1.613 m)   Wt 173 lb 7 oz (78.7 kg)   LMP 02/14/2005 (Approximate)   SpO2 96%   BMI 30.24 kg/m   Wt Readings from Last 3 Encounters:  07/03/20 173 lb 7 oz (78.7 kg)  06/01/20 176 lb (79.8 kg)  09/16/19 167 lb 5 oz (75.9 kg)      Physical Exam Vitals and nursing note reviewed.  Constitutional:      General: She is not in acute distress.    Appearance: Normal appearance. She is well-developed. She is not ill-appearing.  HENT:      Head: Normocephalic and atraumatic.     Right Ear: Hearing, tympanic membrane, ear canal and external ear normal.     Left Ear: Hearing, tympanic membrane, ear canal and external ear normal.  Eyes:     General: No scleral icterus.    Extraocular Movements: Extraocular movements intact.     Conjunctiva/sclera: Conjunctivae normal.     Pupils: Pupils are equal, round, and reactive to light.  Neck:     Thyroid: No thyroid mass, thyromegaly or thyroid tenderness.  Cardiovascular:     Rate and Rhythm: Normal rate and regular rhythm.     Pulses: Normal pulses.          Radial pulses are 2+ on the right side and 2+ on the left side.     Heart sounds: Normal heart sounds. No murmur heard.   Pulmonary:     Effort: Pulmonary effort  is normal. No respiratory distress.     Breath sounds: Normal breath sounds. No wheezing, rhonchi or rales.  Abdominal:     General: Abdomen is flat. Bowel sounds are normal. There is no distension.     Palpations: Abdomen is soft. There is no mass.     Tenderness: There is no abdominal tenderness. There is no guarding or rebound.     Hernia: No hernia is present.  Musculoskeletal:        General: Normal range of motion.     Cervical back: Normal range of motion and neck supple.     Right lower leg: No edema.     Left lower leg: No edema.  Lymphadenopathy:     Cervical: No cervical adenopathy.  Skin:    General: Skin is warm and dry.     Findings: No rash.  Neurological:     General: No focal deficit present.     Mental Status: She is alert and oriented to person, place, and time.     Cranial Nerves: Cranial nerves are intact.     Sensory: Sensation is intact.     Motor: Motor function is intact.     Comments:  CN 2-12 intact EOMI  FTN intact Some unsteadiness with romberg  Psychiatric:        Mood and Affect: Mood normal.        Behavior: Behavior normal.        Thought Content: Thought content normal.        Judgment: Judgment normal.         Results for orders placed or performed in visit on 06/22/20  VITAMIN D 25 Hydroxy (Vit-D Deficiency, Fractures)  Result Value Ref Range   VITD 78.50 30.00 - 100.00 ng/mL  Vitamin B12  Result Value Ref Range   Vitamin B-12 359 211 - 911 pg/mL  Lipid panel  Result Value Ref Range   Cholesterol 164 0 - 200 mg/dL   Triglycerides 52.0 0 - 149 mg/dL   HDL 51.10 >39.00 mg/dL   VLDL 10.4 0.0 - 40.0 mg/dL   LDL Cholesterol 102 (H) 0 - 99 mg/dL   Total CHOL/HDL Ratio 3    NonHDL 112.88   Comprehensive metabolic panel  Result Value Ref Range   Sodium 140 135 - 145 mEq/L   Potassium 4.0 3.5 - 5.1 mEq/L   Chloride 107 96 - 112 mEq/L   CO2 27 19 - 32 mEq/L   Glucose, Bld 104 (H) 70 - 99 mg/dL   BUN 15 6 - 23 mg/dL   Creatinine, Ser 0.79 0.40 - 1.20 mg/dL   Total Bilirubin 0.6 0.2 - 1.2 mg/dL   Alkaline Phosphatase 62 39 - 117 U/L   AST 16 0 - 37 U/L   ALT 10 0 - 35 U/L   Total Protein 6.7 6.0 - 8.3 g/dL   Albumin 3.9 3.5 - 5.2 g/dL   GFR 74.40 >60.00 mL/min   Calcium 8.9 8.4 - 10.5 mg/dL   Lab Results  Component Value Date   TSH 2.08 09/16/2019    EKG - sinus bradycardia rate >55, normal axis, short PR, incomplete LBBB, no acute ST/T changes, similar to prior EKG 09/2018.  Assessment & Plan:  This visit occurred during the SARS-CoV-2 public health emergency.  Safety protocols were in place, including screening questions prior to the visit, additional usage of staff PPE, and extensive cleaning of exam room while observing appropriate contact time as indicated for disinfecting solutions.  Problem List Items Addressed This Visit    Vitamin D deficiency    Discussed transitioning from weekly replacement to daily replacement.       Vitamin B12 deficiency    Increase replacement to daily.       Palpitations    Previously attributed to anxiety attacks.  Update EKG.  Consider cardiology evaluation r/o arrhythmia as cause of symptoms.       Relevant Orders   Ambulatory referral  to Cardiology   Osteoporosis    Continues prolia - reviewed importance of regular Q6 mo dosing and increased risk of fracture if prolia stopped without concomitantly starting new OP med. Will update DEXA scan. Continue cal/vit D supplement.       Relevant Medications   Cholecalciferol (VITAMIN D) 50 MCG (2000 UT) CAPS   Other Relevant Orders   DG Bone Density   Neuropathic pain, arm    Ongoing arm pain - see below.       Migraine headache with aura    Current headaches seem different from her typical migraines ?TTH. nonfocal neurological exam today. Will watch.       Relevant Medications   citalopram (CELEXA) 20 MG tablet   SUMAtriptan (IMITREX) 100 MG tablet   Medial epicondylitis of left elbow    Now followed by worker's comp.  New letter with restrictions provided. Planned PT.       LBBB (left bundle branch block)   Relevant Orders   Ambulatory referral to Cardiology   History of left breast cancer    Continue yearly mammograms. She performs monthly self breast exams.  Has been released from onc care.       Health maintenance examination - Primary    Preventative protocols reviewed and updated unless pt declined. Discussed healthy diet and lifestyle.       Relevant Orders   EKG 12-Lead (Completed)   Ex-smoker    Congratulated on ongoing abstinence.       Anxiety attack    Ongoing. Continue celexa 20mg  daily. Will change lorazepam (overly sedating) to alprazolam with instructions on how to take. Reviewed habit forming nature of this controlled substance and need to keep in safe place. Update with effect. See below.       Relevant Medications   citalopram (CELEXA) 20 MG tablet   ALPRAZolam (XANAX) 0.5 MG tablet    Other Visit Diagnoses    Need for shingles vaccine       Relevant Orders   Varicella-zoster vaccine IM (Completed)       Meds ordered this encounter  Medications  . citalopram (CELEXA) 20 MG tablet    Sig: Take 1 tablet (20 mg total) by mouth  daily.    Dispense:  90 tablet    Refill:  3  . SUMAtriptan (IMITREX) 100 MG tablet    Sig: TAKE 1 TABLET BY MOUTH ONCE FOR 1 DOSE. MAY REPEAT IN 2 HOURS IF HEADACHE PERSISTS OR RECURS.    Dispense:  9 tablet    Refill:  3  . vitamin B-12 (V-R VITAMIN B-12) 500 MCG tablet    Sig: Take 1 tablet (500 mcg total) by mouth daily.  Marland Kitchen ALPRAZolam (XANAX) 0.5 MG tablet    Sig: Take 0.5-1 tablets (0.25-0.5 mg total) by mouth 2 (two) times daily as needed for anxiety.    Dispense:  20 tablet    Refill:  1  . Cholecalciferol (VITAMIN D) 50 MCG (2000 UT) CAPS    Sig: Take 1 capsule (2,000  Units total) by mouth daily.    Dispense:  30 capsule   Orders Placed This Encounter  Procedures  . DG Bone Density    Standing Status:   Future    Standing Expiration Date:   07/03/2021    Order Specific Question:   Reason for Exam (SYMPTOM  OR DIAGNOSIS REQUIRED)    Answer:   osteoporosis f/u    Order Specific Question:   Is the patient pregnant?    Answer:   No    Order Specific Question:   Preferred imaging location?    [facility]  . Varicella-zoster vaccine IM  . Ambulatory referral to Cardiology    Referral Priority:   Routine    Referral Type:   Consultation    Referral Reason:   Specialty Services Required    Requested Specialty:   Cardiology    Number of Visits Requested:   1  . EKG 12-Lead    Patient instructions: Shingrix vaccine today.  Schedule nurse visit for second shot in 2-6 months.  EKG today.  Trial xanax (alprazolam) in place of lorazepam. Let us know now you do with this.  Good to see you today. Return as needed or in 1 year for next physical.  Follow up plan: Return in about 1 year (around 07/03/2021) for annual exam, prior fasting for blood work.  [name], MD

## 2020-07-03 NOTE — Patient Instructions (Addendum)
Shingrix vaccine today.  Schedule nurse visit for second shot in 2-6 months.  EKG today.  Trial xanax (alprazolam) in place of lorazepam. Let us know now you do with this.  Good to see you today. Return as needed or in 1 year for next physical.  Health Maintenance for Postmenopausal Women Menopause is a normal process in which your ability to get pregnant comes to an end. This process happens slowly over many months or years, usually between the ages of 69 and 9. Menopause is complete when you have missed your menstrual periods for 12 months. It is important to talk with your health care provider about some of the most common conditions that affect women after menopause (postmenopausal women). These include heart disease, cancer, and bone loss (osteoporosis). Adopting a healthy lifestyle and getting preventive care can help to promote your health and wellness. The actions you take can also lower your chances of developing some of these common conditions. What should I know about menopause? During menopause, you may get a number of symptoms, such as:  Hot flashes. These can be moderate or severe.  Night sweats.  Decrease in sex drive.  Mood swings.  Headaches.  Tiredness.  Irritability.  Memory problems.  Insomnia. Choosing to treat or not to treat these symptoms is a decision that you make with your health care provider. Do I need hormone replacement therapy?  Hormone replacement therapy is effective in treating symptoms that are caused by menopause, such as hot flashes and night sweats.  Hormone replacement carries certain risks, especially as you become older. If you are thinking about using estrogen or estrogen with progestin, discuss the benefits and risks with your health care provider. What is my risk for heart disease and stroke? The risk of heart disease, heart attack, and stroke increases as you age. One of the causes may be a change in the body's hormones during  menopause. This can affect how your body uses dietary fats, triglycerides, and cholesterol. Heart attack and stroke are medical emergencies. There are many things that you can do to help prevent heart disease and stroke. Watch your blood pressure  High blood pressure causes heart disease and increases the risk of stroke. This is more likely to develop in people who have high blood pressure readings, are of African descent, or are overweight.  Have your blood pressure checked: ? Every 3-5 years if you are 53-35 years of age. ? Every year if you are 88 years old or older. Eat a healthy diet   Eat a diet that includes plenty of vegetables, fruits, low-fat dairy products, and lean protein.  Do not eat a lot of foods that are high in solid fats, added sugars, or sodium. Get regular exercise Get regular exercise. This is one of the most important things you can do for your health. Most adults should:  Try to exercise for at least 150 minutes each week. The exercise should increase your heart rate and make you sweat (moderate-intensity exercise).  Try to do strengthening exercises at least twice each week. Do these in addition to the moderate-intensity exercise.  Spend less time sitting. Even light physical activity can be beneficial. Other tips  Work with your health care provider to achieve or maintain a healthy weight.  Do not use any products that contain nicotine or tobacco, such as cigarettes, e-cigarettes, and chewing tobacco. If you need help quitting, ask your health care provider.  Know your numbers. Ask your health care provider to  check your cholesterol and your blood sugar (glucose). Continue to have your blood tested as directed by your health care provider. Do I need screening for cancer? Depending on your health history and family history, you may need to have cancer screening at different stages of your life. This may include screening for:  Breast cancer.  Cervical  cancer.  Lung cancer.  Colorectal cancer. What is my risk for osteoporosis? After menopause, you may be at increased risk for osteoporosis. Osteoporosis is a condition in which bone destruction happens more quickly than new bone creation. To help prevent osteoporosis or the bone fractures that can happen because of osteoporosis, you may take the following actions:  If you are 44-35 years old, get at least 1,000 mg of calcium and at least 600 mg of vitamin D per day.  If you are older than age 72 but younger than age 39, get at least 1,200 mg of calcium and at least 600 mg of vitamin D per day.  If you are older than age 60, get at least 1,200 mg of calcium and at least 800 mg of vitamin D per day. Smoking and drinking excessive alcohol increase the risk of osteoporosis. Eat foods that are rich in calcium and vitamin D, and do weight-bearing exercises several times each week as directed by your health care provider. How does menopause affect my mental health? Depression may occur at any age, but it is more common as you become older. Common symptoms of depression include:  Low or sad mood.  Changes in sleep patterns.  Changes in appetite or eating patterns.  Feeling an overall lack of motivation or enjoyment of activities that you previously enjoyed.  Frequent crying spells. Talk with your health care provider if you think that you are experiencing depression. General instructions See your health care provider for regular wellness exams and vaccines. This may include:  Scheduling regular health, dental, and eye exams.  Getting and maintaining your vaccines. These include: ? Influenza vaccine. Get this vaccine each year before the flu season begins. ? Pneumonia vaccine. ? Shingles vaccine. ? Tetanus, diphtheria, and pertussis (Tdap) booster vaccine. Your health care provider may also recommend other immunizations. Tell your health care provider if you have ever been abused or do  not feel safe at home. Summary  Menopause is a normal process in which your ability to get pregnant comes to an end.  This condition causes hot flashes, night sweats, decreased interest in sex, mood swings, headaches, or lack of sleep.  Treatment for this condition may include hormone replacement therapy.  Take actions to keep yourself healthy, including exercising regularly, eating a healthy diet, watching your weight, and checking your blood pressure and blood sugar levels.  Get screened for cancer and depression. Make sure that you are up to date with all your vaccines. This information is not intended to replace advice given to you by your health care provider. Make sure you discuss any questions you have with your health care provider. Document Revised: 10/14/2018 Document Reviewed: 10/14/2018 Elsevier Patient Education  2020 [employer].

## 2020-07-03 NOTE — Assessment & Plan Note (Addendum)
Preventative protocols reviewed and updated unless pt declined. Discussed healthy diet and lifestyle.  

## 2020-07-03 NOTE — Assessment & Plan Note (Addendum)
Continues prolia - reviewed importance of regular Q6 mo dosing and increased risk of fracture if prolia stopped without concomitantly starting new OP med. Will update DEXA scan. Continue cal/vit D supplement.

## 2020-07-04 DIAGNOSIS — I447 Left bundle-branch block, unspecified: Secondary | ICD-10-CM | POA: Insufficient documentation

## 2020-07-04 DIAGNOSIS — R002 Palpitations: Secondary | ICD-10-CM | POA: Insufficient documentation

## 2020-07-04 MED ORDER — VITAMIN D 50 MCG (2000 UT) PO CAPS: 1.0000 | ORAL_CAPSULE | Freq: Every day | ORAL | Status: AC

## 2020-07-04 NOTE — Assessment & Plan Note (Signed)
Discussed transitioning from weekly replacement to daily replacement.

## 2020-07-04 NOTE — Assessment & Plan Note (Addendum)
Ongoing arm pain - see below.

## 2020-07-04 NOTE — Assessment & Plan Note (Addendum)
Previously attributed to anxiety attacks.  Update EKG.  Consider cardiology evaluation r/o arrhythmia as cause of symptoms.

## 2020-07-04 NOTE — Assessment & Plan Note (Addendum)
Ongoing. Continue celexa 20mg  daily. Will change lorazepam (overly sedating) to alprazolam with instructions on how to take. Reviewed habit forming nature of this controlled substance and need to keep in safe place. Update with effect. See below.

## 2020-07-04 NOTE — Assessment & Plan Note (Signed)
Congratulated on ongoing abstinence.

## 2020-07-04 NOTE — Assessment & Plan Note (Signed)
Increase replacement to daily.

## 2020-07-04 NOTE — Assessment & Plan Note (Signed)
Current headaches seem different from her typical migraines ?TTH. nonfocal neurological exam today. Will watch.

## 2020-07-04 NOTE — Assessment & Plan Note (Signed)
Continue yearly mammograms. She performs monthly self breast exams.  Has been released from onc care.

## 2020-07-04 NOTE — Assessment & Plan Note (Signed)
Now followed by worker's comp.  New letter with restrictions provided. Planned PT.
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
## 2020-08-08 ENCOUNTER — Other Ambulatory Visit: Payer: Self-pay

## 2020-08-08 ENCOUNTER — Encounter: Payer: Self-pay | Admitting: Cardiology

## 2020-08-08 ENCOUNTER — Ambulatory Visit (INDEPENDENT_AMBULATORY_CARE_PROVIDER_SITE_OTHER): Payer: BC Managed Care – PPO | Admitting: Cardiology

## 2020-08-08 ENCOUNTER — Telehealth: Payer: Self-pay | Admitting: Radiology

## 2020-08-08 VITALS — BP 104/72 | HR 58 | Ht 63.5 in | Wt 173.0 lb

## 2020-08-08 DIAGNOSIS — R002 Palpitations: Secondary | ICD-10-CM

## 2020-08-08 DIAGNOSIS — R079 Chest pain, unspecified: Secondary | ICD-10-CM

## 2020-08-08 DIAGNOSIS — I447 Left bundle-branch block, unspecified: Secondary | ICD-10-CM | POA: Diagnosis not present

## 2020-08-08 DIAGNOSIS — R42 Dizziness and giddiness: Secondary | ICD-10-CM

## 2020-08-08 MED ORDER — METOPROLOL TARTRATE 25 MG PO TABS: ORAL_TABLET | ORAL | 0 refills | Status: DC

## 2020-08-08 NOTE — Telephone Encounter (Signed)
Enrolled patient for a 14 day Zio XT Monitor to be mailed to patients home  

## 2020-08-08 NOTE — Patient Instructions (Signed)
Medication Instructions:  Your physician recommends that you continue on your current medications as directed. Please refer to the Current Medication list given to you today.  *If you need a refill on your cardiac medications before your next appointment, please call your pharmacy*  Testing/Procedures: Your physician has requested that you have an echocardiogram. Echocardiography is a painless test that uses sound waves to create images of your heart. It provides your doctor with information about the size and shape of your heart and how well your heart's chambers and valves are working. This procedure takes approximately one hour. There are no restrictions for this procedure. This will be done at our [facility] location:  [address]  Coronary CTA-see instructions below   [place] Instructions   Your physician has requested you wear your ZIO patch monitor 14 days.   This is a single patch monitor.  [employer] supplies one patch monitor per enrollment.  Additional stickers are not available.   Please do not apply patch if you will be having a Nuclear Stress Test, Echocardiogram, Cardiac CT, MRI, or Chest Xray during the time frame you would be wearing the monitor. The patch cannot be worn during these tests.  You cannot remove and re-apply the ZIO XT patch monitor.   Your ZIO patch monitor will be sent USPS Priority mail from [facility] directly to your home address. The monitor may also be mailed to a PO BOX if home delivery is not available.   It may take 3-5 days to receive your monitor after you have been enrolled.   Once you have received you monitor, please review enclosed instructions.  Your monitor has already been registered assigning a specific monitor serial # to you.   Applying the monitor   Shave hair from upper left chest.   Hold abrader disc by orange tab.  Rub abrader in 40 strokes over left upper chest as indicated in your  monitor instructions.   Clean area with 4 enclosed alcohol pads .  Use all pads to assure are is cleaned thoroughly.  Let dry.   Apply patch as indicated in monitor instructions.  Patch will be place under collarbone on left side of chest with arrow pointing upward.   Rub patch adhesive wings for 2 minutes.Remove white label marked "1".  Remove white label marked "2".  Rub patch adhesive wings for 2 additional minutes.   While looking in a mirror, press and release button in center of patch.  A small green light will flash 3-4 times .  This will be your only indicator the monitor has been turned on.     Do not shower for the first 24 hours.  You may shower after the first 24 hours.   Press button if you feel a symptom. You will hear a small click.  Record Date, Time and Symptom in the Patient Log Book.   When you are ready to remove patch, follow instructions on last 2 pages of Patient Log Book.  Stick patch monitor onto last page of Patient Log Book.   Place Patient Log Book in [place] box.  Use locking tab on box and tape box closed securely.  The [employer] has [employer] on it.  Please place in mailbox as soon as possible.  Your physician should have your test results approximately 7 days after the monitor has been mailed back to [facility].   Call [place] at  [phone] if you have questions regarding your ZIO XT patch monitor.  Call them immediately if you see an orange light blinking on your monitor.   If your monitor falls off in less than 4 days contact our Monitor department at [phone].  If your monitor becomes loose or falls off after 4 days call [employer] at [phone] for suggestions on securing your monitor.    Follow-Up: At [facility], you and your health needs are our priority.  As part of our continuing mission to provide you with exceptional heart care, we have created designated Provider Care Teams.  These Care Teams include  your primary Cardiologist (physician) and Advanced Practice Providers (APPs -  Physician Assistants and Nurse Practitioners) who all work together to provide you with the care you need, when you need it.  We recommend signing up for the patient portal called "MyChart".  Sign up information is provided on this After Visit Summary.  MyChart is used to connect with patients for Virtual Visits (Telemedicine).  Patients are able to view lab/test results, encounter notes, upcoming appointments, etc.  Non-urgent messages can be sent to your provider as well.   To learn more about what you can do with MyChart, go to [email or web address].    Your next appointment:   3 month(s)  The format for your next appointment:   In Person  Provider:   Oswaldo Milian, MD   Other Instructions  Your cardiac CT will be scheduled at one of the below locations:   [facility] [address] [place], [place] [address] [phone]  [place] [address] [place], [place] [address] [phone]  If scheduled at [facility], please arrive at the [facility] main entrance of [facility] 30 minutes prior to test start time. Proceed to the [facility] Radiology Department (first floor) to check-in and test prep.  If scheduled at [facility], please arrive 15 mins early for check-in and test prep.  Please follow these instructions carefully (unless otherwise directed):   On the Night Before the Test: . Be sure to Drink plenty of water. . Do not consume any caffeinated/decaffeinated beverages or chocolate 12 hours prior to your test. . Do not take any antihistamines 12 hours prior to your test.  On the Day of the Test: . Drink plenty of water. Do not drink any water within one hour of the test. . Do not eat any food 4 hours prior to the test. . You may take your regular medications prior to  the test.  . Take metoprolol (Lopressor) two hours prior to test. . FEMALES- please wear underwire-free bra if available       After the Test: . Drink plenty of water. . After receiving IV contrast, you may experience a mild flushed feeling. This is normal. . On occasion, you may experience a mild rash up to 24 hours after the test. This is not dangerous. If this occurs, you can take Benadryl 25 mg and increase your fluid intake. . If you experience trouble breathing, this can be serious. If it is severe call 911 IMMEDIATELY. If it is mild, please call our office. . If you take any of these medications: Glipizide/Metformin, Avandament, Glucavance, please do not take 48 hours after completing test unless otherwise instructed.   Once we have confirmed authorization from your insurance company, we will call you to set up a date and time for your test.  Based on how quickly your insurance processes prior authorizations requests, please allow up to 4 weeks to be contacted for scheduling your Cardiac CT appointment. Be advised that routine Cardiac CT appointments could be scheduled as many as 8 weeks after your provider has ordered it.  For non-scheduling related questions, please contact the cardiac imaging nurse navigator should you have any questions/concerns: Marchia Bond, Cardiac Imaging Nurse Navigator Burley Saver, Interim Cardiac Imaging Nurse [place] and Vascular Services Direct Office Dial: [phone]   For scheduling needs, including cancellations and rescheduling, please call Vivien Rota at [phone], option 3.

## 2020-08-10 ENCOUNTER — Other Ambulatory Visit (INDEPENDENT_AMBULATORY_CARE_PROVIDER_SITE_OTHER): Payer: BC Managed Care – PPO

## 2020-08-10 DIAGNOSIS — R002 Palpitations: Secondary | ICD-10-CM

## 2020-08-22 ENCOUNTER — Other Ambulatory Visit (HOSPITAL_COMMUNITY): Payer: BC Managed Care – PPO

## 2020-09-03 ENCOUNTER — Other Ambulatory Visit: Payer: Self-pay | Admitting: Family Medicine

## 2020-09-05 ENCOUNTER — Other Ambulatory Visit: Payer: Self-pay

## 2020-09-05 ENCOUNTER — Ambulatory Visit (HOSPITAL_COMMUNITY): Payer: BC Managed Care – PPO | Attending: Cardiovascular Disease

## 2020-09-05 DIAGNOSIS — R079 Chest pain, unspecified: Secondary | ICD-10-CM | POA: Insufficient documentation

## 2020-09-05 LAB — ECHOCARDIOGRAM COMPLETE
Area-P 1/2: 3.91 cm2
S' Lateral: 2.6 cm

## 2020-09-19 ENCOUNTER — Other Ambulatory Visit: Payer: Self-pay

## 2020-09-19 ENCOUNTER — Ambulatory Visit (INDEPENDENT_AMBULATORY_CARE_PROVIDER_SITE_OTHER): Payer: BC Managed Care – PPO

## 2020-09-19 DIAGNOSIS — Z23 Encounter for immunization: Secondary | ICD-10-CM | POA: Diagnosis not present

## 2020-09-21 ENCOUNTER — Emergency Department: Payer: BC Managed Care – PPO

## 2020-09-21 ENCOUNTER — Encounter: Payer: Self-pay | Admitting: Emergency Medicine

## 2020-09-21 ENCOUNTER — Observation Stay
Admission: EM | Admit: 2020-09-21 | Discharge: 2020-09-22 | Disposition: A | Discharge status: Home / Self Care | Payer: BC Managed Care – PPO | Attending: Internal Medicine | Admitting: Internal Medicine

## 2020-09-21 ENCOUNTER — Other Ambulatory Visit: Payer: Self-pay

## 2020-09-21 DIAGNOSIS — Z8669 Personal history of other diseases of the nervous system and sense organs: Secondary | ICD-10-CM

## 2020-09-21 DIAGNOSIS — Z79899 Other long term (current) drug therapy: Secondary | ICD-10-CM | POA: Diagnosis not present

## 2020-09-21 DIAGNOSIS — Z87891 Personal history of nicotine dependence: Secondary | ICD-10-CM | POA: Insufficient documentation

## 2020-09-21 DIAGNOSIS — F418 Other specified anxiety disorders: Secondary | ICD-10-CM | POA: Diagnosis present

## 2020-09-21 DIAGNOSIS — R221 Localized swelling, mass and lump, neck: Secondary | ICD-10-CM | POA: Insufficient documentation

## 2020-09-21 DIAGNOSIS — R220 Localized swelling, mass and lump, head: Secondary | ICD-10-CM | POA: Diagnosis not present

## 2020-09-21 DIAGNOSIS — R519 Headache, unspecified: Secondary | ICD-10-CM

## 2020-09-21 DIAGNOSIS — K1120 Sialoadenitis, unspecified: Secondary | ICD-10-CM | POA: Diagnosis not present

## 2020-09-21 DIAGNOSIS — Z853 Personal history of malignant neoplasm of breast: Secondary | ICD-10-CM | POA: Diagnosis not present

## 2020-09-21 DIAGNOSIS — K1121 Acute sialoadenitis: Secondary | ICD-10-CM | POA: Diagnosis present

## 2020-09-21 DIAGNOSIS — F331 Major depressive disorder, recurrent, moderate: Secondary | ICD-10-CM | POA: Diagnosis present

## 2020-09-21 DIAGNOSIS — Z20822 Contact with and (suspected) exposure to covid-19: Secondary | ICD-10-CM | POA: Diagnosis not present

## 2020-09-21 DIAGNOSIS — I889 Nonspecific lymphadenitis, unspecified: Secondary | ICD-10-CM | POA: Diagnosis not present

## 2020-09-21 DIAGNOSIS — K118 Other diseases of salivary glands: Secondary | ICD-10-CM | POA: Diagnosis not present

## 2020-09-21 DIAGNOSIS — I447 Left bundle-branch block, unspecified: Secondary | ICD-10-CM | POA: Diagnosis not present

## 2020-09-21 LAB — BASIC METABOLIC PANEL
Anion gap: 10 (ref 5–15)
BUN: 11 mg/dL (ref 6–20)
CO2: 26 mmol/L (ref 22–32)
Calcium: 9 mg/dL (ref 8.9–10.3)
Chloride: 100 mmol/L (ref 98–111)
Creatinine, Ser: 0.91 mg/dL (ref 0.44–1.00)
GFR, Estimated: >60 mL/min (ref >60)
Glucose, Bld: 126 mg/dL — ABNORMAL HIGH (ref 70–99)
Potassium: 3.7 mmol/L (ref 3.5–5.1)
Sodium: 136 mmol/L (ref 135–145)

## 2020-09-21 LAB — CBC WITH DIFFERENTIAL/PLATELET
Abs Immature Granulocytes: 0.05 10*3/uL (ref 0.00–0.07)
Basophils Absolute: 0.1 10*3/uL (ref 0.0–0.1)
Basophils Relative: 1 %
Eosinophils Absolute: 0.2 10*3/uL (ref 0.0–0.5)
Eosinophils Relative: 2 %
HCT: 41.4 % (ref 36.0–46.0)
Hemoglobin: 13.6 g/dL (ref 12.0–15.0)
Immature Granulocytes: 1 %
Lymphocytes Relative: 12 %
Lymphs Abs: 1.2 10*3/uL (ref 0.7–4.0)
MCH: 28.9 pg (ref 26.0–34.0)
MCHC: 32.9 g/dL (ref 30.0–36.0)
MCV: 88.1 fL (ref 80.0–100.0)
Monocytes Absolute: 0.8 10*3/uL (ref 0.1–1.0)
Monocytes Relative: 8 %
Neutro Abs: 8.3 10*3/uL — ABNORMAL HIGH (ref 1.7–7.7)
Neutrophils Relative %: 76 %
Platelets: 294 10*3/uL (ref 150–400)
RBC: 4.7 MIL/uL (ref 3.87–5.11)
RDW: 13.2 % (ref 11.5–15.5)
WBC: 10.6 10*3/uL — ABNORMAL HIGH (ref 4.0–10.5)
nRBC: 0 % (ref 0.0–0.2)

## 2020-09-21 LAB — C-REACTIVE PROTEIN: CRP: 2.1 mg/dL — ABNORMAL HIGH (ref <1.0)

## 2020-09-21 LAB — RESPIRATORY PANEL BY RT PCR (FLU A&B, COVID)
Influenza A by PCR: NEGATIVE
Influenza B by PCR: NEGATIVE
SARS Coronavirus 2 by RT PCR: NEGATIVE

## 2020-09-21 LAB — SEDIMENTATION RATE: Sed Rate: 8 mm/hr (ref 0–30)

## 2020-09-21 LAB — LACTIC ACID, PLASMA
Lactic Acid, Venous: 2 mmol/L (ref 0.5–1.9)
Lactic Acid, Venous: 0.9 mmol/L (ref 0.5–1.9)

## 2020-09-21 LAB — BRAIN NATRIURETIC PEPTIDE: B Natriuretic Peptide: 37 pg/mL (ref 0.0–100.0)

## 2020-09-21 MED ORDER — CALCIUM CARBONATE-VITAMIN D 500-200 MG-UNIT PO TABS
2.0000 | ORAL_TABLET | Freq: Every day | ORAL | Status: DC
  Administered 2020-09-21 – 2020-09-22 (×2): 2 via ORAL
  Filled 2020-09-21 (×2): qty 2

## 2020-09-21 MED ORDER — LACTATED RINGERS IV SOLN: INTRAVENOUS | Status: DC

## 2020-09-21 MED ORDER — CLOBETASOL PROPIONATE 0.05 % EX CREA
1.0000 "application " | TOPICAL_CREAM | Freq: Two times a day (BID) | CUTANEOUS | Status: DC | PRN
  Filled 2020-09-21: qty 15

## 2020-09-21 MED ORDER — ALPRAZOLAM 0.5 MG PO TABS: 0.2500 mg | ORAL_TABLET | Freq: Two times a day (BID) | ORAL | Status: DC | PRN

## 2020-09-21 MED ORDER — MORPHINE SULFATE (PF) 4 MG/ML IV SOLN
4.0000 mg | Freq: Once | INTRAVENOUS | Status: AC
  Administered 2020-09-21: 4 mg via INTRAVENOUS
  Filled 2020-09-21: qty 1

## 2020-09-21 MED ORDER — ONDANSETRON HCL 4 MG/2ML IJ SOLN
4.0000 mg | INTRAMUSCULAR | Status: AC
  Administered 2020-09-21: 4 mg via INTRAVENOUS
  Filled 2020-09-21: qty 2

## 2020-09-21 MED ORDER — ONDANSETRON HCL 4 MG/2ML IJ SOLN
4.0000 mg | Freq: Three times a day (TID) | INTRAMUSCULAR | Status: DC | PRN
  Administered 2020-09-21: 4 mg via INTRAVENOUS
  Filled 2020-09-21: qty 2

## 2020-09-21 MED ORDER — OXYCODONE-ACETAMINOPHEN 5-325 MG PO TABS
1.0000 | ORAL_TABLET | ORAL | Status: DC | PRN
  Administered 2020-09-21 – 2020-09-22 (×4): 1 via ORAL
  Filled 2020-09-21 (×4): qty 1

## 2020-09-21 MED ORDER — SODIUM CHLORIDE 0.9 % IV SOLN
3.0000 g | Freq: Four times a day (QID) | INTRAVENOUS | Status: DC
  Administered 2020-09-21 – 2020-09-22 (×5): 3 g via INTRAVENOUS
  Filled 2020-09-21 (×2): qty 8
  Filled 2020-09-21: qty 3
  Filled 2020-09-21 (×4): qty 8
  Filled 2020-09-21 (×2): qty 3

## 2020-09-21 MED ORDER — ENOXAPARIN SODIUM 40 MG/0.4ML ~~LOC~~ SOLN
40.0000 mg | SUBCUTANEOUS | Status: DC
  Administered 2020-09-21: 40 mg via SUBCUTANEOUS
  Filled 2020-09-21 (×2): qty 0.4

## 2020-09-21 MED ORDER — LACTATED RINGERS IV BOLUS
1000.0000 mL | Freq: Once | INTRAVENOUS | Status: AC
  Administered 2020-09-21: 1000 mL via INTRAVENOUS

## 2020-09-21 MED ORDER — ADULT MULTIVITAMIN W/MINERALS CH
1.0000 | ORAL_TABLET | Freq: Every day | ORAL | Status: DC
  Administered 2020-09-21 (×2): 1 via ORAL
  Filled 2020-09-21: qty 1

## 2020-09-21 MED ORDER — IOHEXOL 300 MG/ML  SOLN
75.0000 mL | Freq: Once | INTRAMUSCULAR | Status: AC | PRN
  Administered 2020-09-21: 75 mL via INTRAVENOUS

## 2020-09-21 MED ORDER — VANCOMYCIN HCL 1500 MG/300ML IV SOLN
1500.0000 mg | Freq: Once | INTRAVENOUS | Status: AC
  Administered 2020-09-21: 1500 mg via INTRAVENOUS
  Filled 2020-09-21: qty 300

## 2020-09-21 MED ORDER — VANCOMYCIN HCL IN DEXTROSE 1-5 GM/200ML-% IV SOLN
1000.0000 mg | Freq: Two times a day (BID) | INTRAVENOUS | Status: DC
  Administered 2020-09-21 – 2020-09-22 (×2): 1000 mg via INTRAVENOUS
  Filled 2020-09-21 (×3): qty 200

## 2020-09-21 MED ORDER — DEXAMETHASONE SODIUM PHOSPHATE 10 MG/ML IJ SOLN
10.0000 mg | Freq: Once | INTRAMUSCULAR | Status: AC
  Administered 2020-09-21: 10 mg via INTRAVENOUS
  Filled 2020-09-21: qty 1

## 2020-09-21 MED ORDER — CITALOPRAM HYDROBROMIDE 20 MG PO TABS
20.0000 mg | ORAL_TABLET | Freq: Every day | ORAL | Status: DC
  Administered 2020-09-21 – 2020-09-22 (×2): 20 mg via ORAL
  Filled 2020-09-21 (×2): qty 1

## 2020-09-21 MED ORDER — ACETAMINOPHEN 325 MG PO TABS: 650.0000 mg | ORAL_TABLET | Freq: Four times a day (QID) | ORAL | Status: DC | PRN

## 2020-09-21 MED ORDER — SUMATRIPTAN SUCCINATE 50 MG PO TABS
100.0000 mg | ORAL_TABLET | ORAL | Status: DC | PRN
  Filled 2020-09-21: qty 2

## 2020-09-21 MED ORDER — VITAMIN D 25 MCG (1000 UNIT) PO TABS
2000.0000 [IU] | ORAL_TABLET | Freq: Every day | ORAL | Status: DC
  Administered 2020-09-21 – 2020-09-22 (×2): 2000 [IU] via ORAL
  Filled 2020-09-21 (×2): qty 2

## 2020-09-21 MED ORDER — CYANOCOBALAMIN 500 MCG PO TABS
500.0000 ug | ORAL_TABLET | Freq: Every day | ORAL | Status: DC
  Administered 2020-09-22: 500 ug via ORAL
  Filled 2020-09-21: qty 1

## 2020-09-21 MED ORDER — MORPHINE SULFATE (PF) 2 MG/ML IV SOLN
2.0000 mg | INTRAVENOUS | Status: DC | PRN
  Administered 2020-09-21: 2 mg via INTRAVENOUS
  Filled 2020-09-21: qty 1

## 2020-09-21 NOTE — ED Notes (Signed)
Date and time results received: 09/21/20  (use smartphrase ".now" to insert current time)  Test: Lactate Critical Value: 2.0  Name of Provider Notified: Karma Greaser  Orders Received? Or Actions Taken?: awaiting orders
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
## 2020-09-21 NOTE — ED Notes (Signed)
This RN assumed care for pt. Waiting for pt to return from CT

## 2020-09-21 NOTE — ED Triage Notes (Signed)
Patient ambulatory to triage with steady gait, without difficulty or distress noted; pt reports since last night having swelling to left side jaw, neck and behind ear; painful to touch with some "tightness" noted upon swallowing; denies throat pain, denies any recent illness; st on Tues she received her 2nd shingles inj (hx egg allergy)

## 2020-09-21 NOTE — ED Notes (Signed)
Pt with egg allergy received 2nd shingles shot on Tuesday and is endorsing L neck, ear and jaw tenderness, swelling and difficulty swallowing. Pt endorsing that symptom started last night at 8pm

## 2020-09-21 NOTE — ED Provider Notes (Signed)
Emergency department handoff note  Care of this patient was signed out to me at the end of the previous provider shift.  All pertinent patient formation was conveyed and all questions were answered.  This patient was pending a CT of the soft tissue of the neck with contrast that shows an acute left parotitis.  As this infection can quickly spread to a deep space infection of the neck, patient was placed on empiric IV antibiotics and referred for admission.  Patient agrees with this plan.  I spoke to Dr. Blaine Hamper in internal medicine who agrees with plan for admission.   Naaman Plummer, MD 09/21/20 0900
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
## 2020-09-21 NOTE — Consult Note (Signed)
Pharmacy Antibiotic Note  Sabrina Wright is a 59 y.o. female admitted on 09/21/2020 with acute parotitis.  Pharmacy has been consulted for vancomycin dosing.  Plan: Will give Vancomycin 1500mg  loading dose now, followed by Vancomycin 1000mg  IV every 12 hours.  Goal trough 15-20 mcg/mL.  Height: 5\' 4"  (162.6 cm) Weight: 77.1 kg (170 lb) IBW/kg (Calculated) : 54.7  Temp (24hrs), Avg:98.5 F (36.9 C), Min:98.2 F (36.8 C), Max:98.8 F (37.1 C)  Recent Labs  Lab 09/21/20 0557 09/21/20 0844  WBC 10.6*  --   CREATININE 0.91  --   LATICACIDVEN 2.0* 0.9    Estimated Creatinine Clearance: 66.9 mL/min (by C-G formula based on SCr of 0.91 mg/dL).    Allergies  Allergen Reactions  . Codeine Nausea And Vomiting  . Eggs Or Egg-Derived Products Nausea And Vomiting    Antimicrobials this admission: Unasyn 11/18>> Vancomycin 11/18 >>  Dose adjustments this admission: none  Microbiology results: 11/18 BCx: pending  Thank you for allowing pharmacy to be a part of this patient's care.  Lu Duffel, PharmD, BCPS Clinical Pharmacist 09/21/2020 9:45 AM

## 2020-09-22 DIAGNOSIS — K1121 Acute sialoadenitis: Secondary | ICD-10-CM | POA: Diagnosis not present

## 2020-09-22 LAB — CBC
HCT: 35.7 % — ABNORMAL LOW (ref 36.0–46.0)
Hemoglobin: 11.9 g/dL — ABNORMAL LOW (ref 12.0–15.0)
MCH: 29.5 pg (ref 26.0–34.0)
MCHC: 33.3 g/dL (ref 30.0–36.0)
MCV: 88.4 fL (ref 80.0–100.0)
Platelets: 295 10*3/uL (ref 150–400)
RBC: 4.04 MIL/uL (ref 3.87–5.11)
RDW: 13.1 % (ref 11.5–15.5)
WBC: 14.1 10*3/uL — ABNORMAL HIGH (ref 4.0–10.5)
nRBC: 0 % (ref 0.0–0.2)

## 2020-09-22 LAB — BASIC METABOLIC PANEL
Anion gap: 8 (ref 5–15)
BUN: 13 mg/dL (ref 6–20)
CO2: 27 mmol/L (ref 22–32)
Calcium: 8.8 mg/dL — ABNORMAL LOW (ref 8.9–10.3)
Chloride: 103 mmol/L (ref 98–111)
Creatinine, Ser: 0.74 mg/dL (ref 0.44–1.00)
GFR, Estimated: >60 mL/min (ref >60)
Glucose, Bld: 120 mg/dL — ABNORMAL HIGH (ref 70–99)
Potassium: 4.1 mmol/L (ref 3.5–5.1)
Sodium: 138 mmol/L (ref 135–145)

## 2020-09-22 LAB — MRSA PCR SCREENING: MRSA by PCR: NEGATIVE

## 2020-09-22 LAB — HIV ANTIBODY (ROUTINE TESTING W REFLEX): HIV Screen 4th Generation wRfx: NONREACTIVE

## 2020-09-22 MED ORDER — FLUCONAZOLE 100 MG PO TABS
100.0000 mg | ORAL_TABLET | Freq: Every day | ORAL | Status: DC
  Administered 2020-09-22: 100 mg via ORAL
  Filled 2020-09-22: qty 1

## 2020-09-22 MED ORDER — METHYLPREDNISOLONE 4 MG PO TBPK: ORAL_TABLET | ORAL | 0 refills | Status: DC

## 2020-09-22 MED ORDER — AMOXICILLIN-POT CLAVULANATE 875-125 MG PO TABS: 1.0000 | ORAL_TABLET | Freq: Two times a day (BID) | ORAL | 0 refills | Status: AC

## 2020-09-22 MED ORDER — ACETAMINOPHEN 325 MG PO TABS: 650.0000 mg | ORAL_TABLET | Freq: Four times a day (QID) | ORAL | Status: AC | PRN

## 2020-09-22 MED ORDER — FLUCONAZOLE 150 MG PO TABS: 150.0000 mg | ORAL_TABLET | Freq: Every day | ORAL | 0 refills | Status: AC | PRN

## 2020-09-22 NOTE — Consult Note (Signed)
Pharmacy Antibiotic Note  TRICHA RUGGIRELLO is a 59 y.o. female admitted on 09/21/2020 with acute parotitis.  Pharmacy has been consulted for vancomycin dosing.  Plan: Continue  Vancomycin 1000mg  IV every 12 hours.  Goal trough 15-20 mcg/mL.  Consider trough prior to 5th or 6th scheduled dose if Vancomycin continues (today will be 2nd and 3rd scheduled doses)  Height: 5\' 4"  (162.6 cm) Weight: 77.1 kg (170 lb) IBW/kg (Calculated) : 54.7  Temp (24hrs), Avg:98.1 F (36.7 C), Min:97.6 F (36.4 C), Max:98.8 F (37.1 C)  Recent Labs  Lab 09/21/20 0557 09/21/20 0844 09/22/20 0455  WBC 10.6*  --  14.1*  CREATININE 0.91  --  0.74  LATICACIDVEN 2.0* 0.9  --     Estimated Creatinine Clearance: 76.1 mL/min (by C-G formula based on SCr of 0.74 mg/dL).    Allergies  Allergen Reactions  . Codeine Nausea And Vomiting  . Eggs Or Egg-Derived Products Nausea And Vomiting    Antimicrobials this admission: Unasyn 11/18>> Vancomycin 11/18 >>  Dose adjustments this admission: none  Microbiology results: 11/18 BCx: pending  Thank you for allowing pharmacy to be a part of this patient's care.  Lu Duffel, PharmD, BCPS Clinical Pharmacist 09/22/2020 8:09 AM

## 2020-09-22 NOTE — Discharge Instructions (Signed)
Parotitis  Parotitis is inflammation of one or both of your parotid glands. These glands produce saliva. They are found on each side of your face, below and in front of your earlobes. The saliva that they produce comes out of tiny openings (ducts) inside your cheeks. Parotitis may cause sudden swelling and pain (acute parotitis). It can also cause repeated episodes of swelling and pain or continued swelling that may or may not be painful (chronic parotitis). What are the causes? This condition may be caused by:  Infections from bacteria.  Infections from viruses, such as mumps or HIV.  Blockage (obstruction) of saliva flow through the parotid glands. This can be from a stone, scar tissue, or a tumor.  Diseases that cause your body's defense system (immune system) to attack healthy cells in your salivary glands. These are called autoimmune diseases. What increases the risk? You are more likely to develop this condition if:  You are 50 years old or older.  You do not drink enough fluids (are dehydrated).  You drink too much alcohol.  You have: ? A dry mouth. ? Poor dental hygiene. ? Diabetes. ? Gout. ? A long-term illness.  You have had radiation treatments to the head and neck.  You take certain medicines. What are the signs or symptoms? Symptoms of this condition depend on the cause. Symptoms may include:  Swelling under and in front of the ear. This may get worse after eating.  Redness of the skin over the parotid gland.  Pain and tenderness over the parotid gland. This may get worse after eating.  Fever or chills.  Pus coming from the ducts inside the mouth.  Dry mouth.  A bad taste in the mouth. How is this diagnosed? This condition may be diagnosed based on:  Your medical history.  A physical exam.  Tests to find the cause of the parotitis. These may include: ? Doing blood tests to check for an autoimmune disease or infections from a virus. ? Taking a  fluid sample from the parotid gland and testing it for infection. ? Injecting the ducts of the parotid gland with a dye and then taking X-rays (sialogram). ? Having other imaging tests of the gland, such as X-rays, ultrasound, MRI, or CT scan. ? Checking the opening of the gland for a stone or obstruction. ? Placing a needle into the gland to remove tissue for a biopsy (fine needle aspiration). How is this treated? Treatment for this condition depends on the cause. Treatment may include:  Antibiotic medicine for a bacterial infection.  Drinking more fluids.  Removing a stone or obstruction.  Treating an underlying disease that is causing parotitis.  Surgery to drain an infection, remove a growth, or remove the whole gland (parotidectomy). Treatment may not be needed if parotid swelling goes away with home care. Follow these instructions at home: Medicines   Take over-the-counter and prescription medicines only as told by your health care provider.  If you were prescribed an antibiotic medicine, take it as told by your health care provider. Do not stop taking the antibiotic even if you start to feel better. Managing pain and swelling  If directed, apply heat to the affected area as often as told by your health care provider. Use the heat source that your health care provider recommends, such as a moist heat pack or a heating pad. To apply the heat: ? Place a towel between your skin and the heat source. ? Leave the heat on for 20-30 minutes. ?   Remove the heat if your skin turns bright red. This is especially important if you are unable to feel pain, heat, or cold. You may have a greater risk of getting burned.  Gargle with a salt-water mixture 3-4 times a day or as needed. To make a salt-water mixture, completely dissolve -1 tsp (3-6 g) of salt in 1 cup (237 mL) of warm water.  Gently massage the parotid glands as told by your health care provider. General instructions   Drink  enough fluid to keep your urine pale yellow.  Keep your mouth clean and moist.  Try sucking on sour candy. This may help to make your mouth less dry by stimulating the flow of saliva.  Maintain good oral health. ? Brush your teeth at least two times a day. ? Floss your teeth every day. ? See your dentist regularly.  Do not use any products that contain nicotine or tobacco, such as cigarettes, e-cigarettes, and chewing tobacco. If you need help quitting, ask your health care provider.  Do not drink alcohol.  Keep all follow-up visits as told by your health care provider. This is important. Contact a health care provider if:  You have a fever or chills.  You have new symptoms.  Your symptoms get worse.  Your symptoms do not improve with treatment. Get help right away if:  You have difficulty breathing or swallowing because of the swollen gland. Summary  Parotitis is inflammation of one or both of your parotid glands.  Symptoms include pain and swelling under and in front of the ear. They may also include a fever and a bad taste in your mouth.  This condition may be treated with antibiotics, increasing fluids, or surgery.  In some cases, parotitis may go away on its own without treatment.  You should drink plenty of fluids, maintain good oral hygiene, and avoid tobacco products. This information is not intended to replace advice given to you by your health care provider. Make sure you discuss any questions you have with your health care provider. Document Revised: 05/19/2018 Document Reviewed: 05/19/2018 [employer] Patient Education  2020 [employer]  

## 2020-09-22 NOTE — Discharge Summary (Signed)
Physician Discharge Summary  Sabrina Wright KTG:[id number] DOB: 1961-02-28 DOA: 09/21/2020  PCP: [name], MD  Admit date: 09/21/2020 Discharge date: 09/22/2020  Admitted From: Home Disposition: Home  Recommendations for Outpatient Follow-up:  1. Follow up with PCP in 1-2 weeks 2.   Home Health: No Equipment/Devices: None Discharge Condition: Stable CODE STATUS: Full Diet recommendation: Heart Healthy  Brief/Interim Summary:  Sabrina Wright is a 59 y.o. female with medical history significant of depression, anxiety, former smoker, left bundle blockade, migraine headache, breast cancer (s/p of left lumpectomy, chemo and radiation therapy 2003), who presents with swelling and pain in the left jaw.  Pt states that his symptoms started yesterday, including swelling, pain in the left jaw and surrounding left ear and neck.  It has been progressively worsening.  The pain is constant, sharp, moderate to severe. She has not had any recent trauma. She states that she has some difficulty opening her mouth due to pain, but no difficulty handling her secretions at this time. Her voice is normal. No fever or chills. Patient does not have chest pain, cough, shortness breath.  She has no dental injuries or infections of which she is aware.   Patient exam improved substantially after initiation of IV antibiotics and 10 mg IV Decadron.  A CT was reassuring that no abscess or fluid collection was found no surgical evaluation was warranted at this time.  Patient exam improved on time of discharge.  Stable for discharge home.  Follow-up with PCP in 1 week.  Will transition to Augmentin to complete total 10-day antibiotic course for nonsuppurative parotitis.  Medrol Dosepak prescribed for anti-inflammatory effect.   Discharge Diagnoses:  Principal Problem:   Acute parotitis Active Problems:   Hx of migraines   Depression with anxiety  Acute parotitis Patient has mild leukocytosis with WBC 10.6,  lactic acid 2.0, no fever.   Does not meet criteria for sepsis. Exam improved after admission Received Decadron, vancomycin, Unasyn from admission Pain and swelling improved Patient tolerating regular diet at time of discharge Transition to Augmentin, complete 10-day antibiotic course Medrol Dosepak for anti-inflammatory effect Stable for discharge home Follow-up with PCP in 1 week  Hx of migraines -continue home as needed sumatriptan  Depression with anxiety -Continue home as needed Xanax, Celexa  Discharge Instructions  Discharge Instructions    Diet - low sodium heart healthy   Complete by: As directed    Increase activity slowly   Complete by: As directed      Allergies as of 09/22/2020      Reactions   Codeine Nausea And Vomiting   Eggs Or Egg-derived Products Nausea And Vomiting      Medication List    TAKE these medications   acetaminophen 325 MG tablet Commonly known as: TYLENOL Take 2 tablets (650 mg total) by mouth every 6 (six) hours as needed for fever, headache or moderate pain.   ALPRAZolam 0.5 MG tablet Commonly known as: XANAX Take 0.5-1 tablets (0.25-0.5 mg total) by mouth 2 (two) times daily as needed for anxiety.   amoxicillin-clavulanate 875-125 MG tablet Commonly known as: Augmentin Take 1 tablet by mouth 2 (two) times daily for 9 days.   Calcium Carb-Cholecalciferol 600-800 MG-UNIT Tabs Commonly known as: Calcium 600/Vitamin D3 Take 2 tablets by mouth daily.   citalopram 20 MG tablet Commonly known as: CELEXA Take 1 tablet (20 mg total) by mouth daily.   clobetasol cream 0.05 % Commonly known as: TEMOVATE APPLY 1 APPLICATION TOPICALLY 2 (TWO)  TIMES DAILY. APPLY TO AFFECTED AREA NO MORE THAN 2 WEEKS AT A TIME+ What changed:   how much to take  how to take this  when to take this  reasons to take this  additional instructions   fluconazole 150 MG tablet Commonly known as: Diflucan Take 1 tablet (150 mg total) by mouth daily  as needed for up to 7 days.   meloxicam 7.5 MG tablet Commonly known as: MOBIC TAKE 1 TABLET BY MOUTH EVERY DAY What changed: how much to take   methylPREDNISolone 4 MG Tbpk tablet Commonly known as: MEDROL DOSEPAK Take as per package directions   metoprolol tartrate 25 MG tablet Commonly known as: LOPRESSOR Take 25 mg (1 tablet) TWO hours prior to CT   multivitamin with minerals Tabs tablet Take 1 tablet by mouth at bedtime.   Prolia 60 MG/ML Sosy injection Generic drug: denosumab INJECT 60MG  SUBCUTANEOUSLY  EVERY 6 MONTHS (GIVEN AT  PRESCRIBERS OFFICE) What changed:   how much to take  how to take this  when to take this   promethazine 25 MG tablet Commonly known as: PHENERGAN Take 1 tablet (25 mg total) by mouth every 8 (eight) hours as needed for nausea or vomiting. Caution of sedation   SUMAtriptan 100 MG tablet Commonly known as: IMITREX TAKE 1 TABLET BY MOUTH ONCE FOR 1 DOSE. MAY REPEAT IN 2 HOURS IF HEADACHE PERSISTS OR RECURS. What changed:   how much to take  how to take this  when to take this  reasons to take this   vitamin B-12 500 MCG tablet Commonly known as: V-R VITAMIN B-12 Take 1 tablet (500 mcg total) by mouth daily.   Vitamin D 50 MCG (2000 UT) Caps Take 1 capsule (2,000 Units total) by mouth daily.       Follow-up Information    [name], MD. Go on 09/27/2020.   Specialty: Family Medicine Why: @11 :00 AM  Contact information: [place] [address] [phone]              Allergies  Allergen Reactions  . Codeine Nausea And Vomiting  . Eggs Or Egg-Derived Products Nausea And Vomiting    Consultations:  None   Procedures/Studies: CT Soft Tissue Neck W Contrast  Result Date: 09/21/2020 CLINICAL DATA:  Parotid region mass with swelling about the left jaw since last night. EXAM: CT NECK WITH CONTRAST TECHNIQUE: Multidetector CT imaging of the neck was performed using the standard  protocol following the bolus administration of intravenous contrast. CONTRAST:  84mL OMNIPAQUE IOHEXOL 300 MG/ML  SOLN COMPARISON:  01/17/2005 FINDINGS: Pharynx and larynx: Normal. No mass or swelling. Salivary glands: Interval enlargement with adjacent fat stranding of the left parotid. No calculus seen along the parotid duct and no mass lesion at the left buccal space. Negative for abscess. Since prior the right parotid has also become fatty atrophic. Unremarkable submandibular glands and sublingual glands. Thyroid: Normal. Lymph nodes: Larger left-sided lymph nodes attributed to reactive adenitis. Vascular: Unremarkable Limited intracranial: Negative Visualized orbits: Partial coverage is negative Mastoids and visualized paranasal sinuses: Clear Skeleton: No acute or aggressive finding Upper chest: Reticulation with volume loss at the left apex, also seen in 2006 and attributed to scarring. Mild centrilobular emphysema. IMPRESSION: 1. Left parotiditis. 2. Right parotid atrophy since 2006. 3.  Emphysema (ICD10-J43.9). Electronically Signed   By: Monte Fantasia M.D.   On: 09/21/2020 07:27   ECHOCARDIOGRAM COMPLETE  Result Date: 09/05/2020    ECHOCARDIOGRAM REPORT  Patient Name:   AASHVI REZABEK Date of Exam: 09/05/2020 Medical Rec #:  [id number]      Height:       63.5 in Accession #:    [id number]     Weight:       173.0 lb Date of Birth:  [birth year]       BSA:          1.829 m Patient Age:    82 years       BP:           104/72 mmHg Patient Gender: F              HR:           54 bpm. Exam Location:  [facility] Procedure: 2D Echo, 3D Echo, Cardiac Doppler, Color Doppler and Strain Analysis Indications:    R07.9 Chest pain  History:        Patient has no prior history of Echocardiogram examinations.                 Risk Factors:Former Smoker. Left bundle branch block. History of                 breast cancer.  Sonographer:    Wilford Sports Rodgers-Jones RDCS Referring Phys: [id number] [place]  1. Left ventricular ejection fraction, by estimation, is 60 to 65%. The left ventricle has normal function. The left ventricle has no regional wall motion abnormalities. Left ventricular diastolic parameters are consistent with Grade I diastolic dysfunction (impaired relaxation). The average left ventricular global longitudinal strain is -19.5 %. The global longitudinal strain is normal.  2. Right ventricular systolic function is normal. The right ventricular size is normal. There is mildly elevated pulmonary artery systolic pressure.  3. The mitral valve is normal in structure. Trivial mitral valve regurgitation. No evidence of mitral stenosis.  4. The aortic valve is tricuspid. Aortic valve regurgitation is not visualized. No aortic stenosis is present.  5. The inferior vena cava is normal in size with greater than 50% respiratory variability, suggesting right atrial pressure of 3 mmHg. FINDINGS  Left Ventricle: Left ventricular ejection fraction, by estimation, is 60 to 65%. The left ventricle has normal function. The left ventricle has no regional wall motion abnormalities. The average left ventricular global longitudinal strain is -19.5 %. The global longitudinal strain is normal. The left ventricular internal cavity size was normal in size. There is no left ventricular hypertrophy. Left ventricular diastolic parameters are consistent with Grade I diastolic dysfunction (impaired relaxation). Normal left ventricular filling pressure. Right Ventricle: The right ventricular size is normal. No increase in right ventricular wall thickness. Right ventricular systolic function is normal. There is mildly elevated pulmonary artery systolic pressure. The tricuspid regurgitant velocity is 3.12  m/s, and with an assumed right atrial pressure of 3 mmHg, the estimated right ventricular systolic pressure is 02.7 mmHg. Left Atrium: Left atrial size was normal in size. Right Atrium: Right atrial size was normal in  size. Pericardium: There is no evidence of pericardial effusion. Mitral Valve: The mitral valve is normal in structure. Trivial mitral valve regurgitation. No evidence of mitral valve stenosis. Tricuspid Valve: The tricuspid valve is normal in structure. Tricuspid valve regurgitation is mild . No evidence of tricuspid stenosis. Aortic Valve: The aortic valve is tricuspid. Aortic valve regurgitation is not visualized. No aortic stenosis is present. Pulmonic Valve: The pulmonic valve was normal in structure. Pulmonic valve regurgitation is not visualized. No evidence  of pulmonic stenosis. Aorta: The aortic root is normal in size and structure. Venous: The inferior vena cava is normal in size with greater than 50% respiratory variability, suggesting right atrial pressure of 3 mmHg. IAS/Shunts: No atrial level shunt detected by color flow Doppler.  LEFT VENTRICLE PLAX 2D LVIDd:         4.10 cm  Diastology LVIDs:         2.60 cm  LV e' medial:    6.96 cm/s LV PW:         0.70 cm  LV E/e' medial:  8.9 LV IVS:        0.70 cm  LV e' lateral:   8.92 cm/s LVOT diam:     2.00 cm  LV E/e' lateral: 6.9 LV SV:         91 LV SV Index:   50       2D Longitudinal Strain LVOT Area:     3.14 cm 2D Strain GLS (A2C):   -17.6 %                         2D Strain GLS (A3C):   -21.7 %                         2D Strain GLS (A4C):   -19.2 %                         2D Strain GLS Avg:     -19.5 %                          3D Volume EF:                         3D EF:        58 %                         LV EDV:       101 ml                         LV ESV:       42 ml                         LV SV:        59 ml RIGHT VENTRICLE             IVC RV Basal diam:  4.30 cm     IVC diam: 1.10 cm RV S prime:     14.80 cm/s TAPSE (M-mode): 2.1 cm LEFT ATRIUM             Index       RIGHT ATRIUM           Index LA diam:        4.20 cm 2.30 cm/m  RA Area:     14.20 cm LA Vol (A2C):   41.2 ml 22.53 ml/m RA Volume:   40.80 ml  22.31 ml/m LA Vol (A4C):    42.5 ml 23.24 ml/m LA Biplane Vol: 43.0 ml 23.51 ml/m  AORTIC VALVE LVOT Vmax:   112.00 cm/s LVOT Vmean:  74.300 cm/s LVOT VTI:    0.290 m  AORTA Ao Root diam: 2.60 cm  Ao Asc diam:  3.30 cm MITRAL VALVE               TRICUSPID VALVE MV Area (PHT): 3.91 cm    TR Peak grad:   38.9 mmHg MV Decel Time: 194 msec    TR Vmax:        312.00 cm/s MV E velocity: 61.60 cm/s MV A velocity: 71.30 cm/s  SHUNTS MV E/A ratio:  0.86        Systemic VTI:  0.29 m                            Systemic Diam: 2.00 cm Skeet Latch MD Electronically signed by Skeet Latch MD Signature Date/Time: 09/05/2020/11:12:21 AM    Final    LONG TERM MONITOR (3-14 DAYS)  Result Date: 09/16/2020  13 episodes of SVT, longest lasting 13 beats.  14 days of data recorded on Zio monitor. Patient had a min HR of 46 bpm, max HR of 187 bpm, and avg HR of 65 bpm. Predominant underlying rhythm was Sinus Rhythm. No VT, atrial fibrillation, high degree block, or pauses noted. 13 episodes of SVT, longest lasting 13 beats.  Isolated atrial and ventricular ectopy was rare (<1%). There were 16 triggered events, corresponding to sinus rhythm +/- PACs and SVT.      (Echo, Carotid, EGD, Colonoscopy, ERCP)    Subjective: Seen and examined the day of discharge.  Stable, no distress.  No fevers over interval.  Tolerating regular diet.  No pain complaints.  Stable for discharge home  Discharge Exam: Vitals:   09/22/20 0729 09/22/20 1247  BP: 130/61 (!) 117/51  Pulse: (!) 51 (!) 45  Resp: 18 16  Temp: 97.8 F (36.6 C) 97.7 F (36.5 C)  SpO2: 96% 95%   Vitals:   09/21/20 2349 09/22/20 0446 09/22/20 0729 09/22/20 1247  BP: (!) 105/52 104/60 130/61 (!) 117/51  Pulse: (!) 56 (!) 49 (!) 51 (!) 45  Resp: 16 16 18 16   Temp: 98.3 F (36.8 C) 98.3 F (36.8 C) 97.8 F (36.6 C) 97.7 F (36.5 C)  TempSrc:   Oral Oral  SpO2: 96% 95% 96% 95%  Weight:      Height:        General: Pt is alert, awake, not in acute distress Cardiovascular:  RRR, S1/S2 +, no rubs, no gallops Respiratory: CTA bilaterally, no wheezing, no rhonchi Abdominal: Soft, NT, ND, bowel sounds + Extremities: no edema, no cyanosis    The results of significant diagnostics from this hospitalization (including imaging, microbiology, ancillary and laboratory) are listed below for reference.     Microbiology: Recent Results (from the past 240 hour(s))  Respiratory Panel by RT PCR (Flu A&B, Covid) - Nasopharyngeal Swab     Status: None   Collection Time: 09/21/20  6:28 AM   Specimen: Nasopharyngeal Swab  Result Value Ref Range Status   SARS Coronavirus 2 by RT PCR NEGATIVE NEGATIVE Final    Comment: (NOTE) SARS-CoV-2 target nucleic acids are NOT DETECTED.  The SARS-CoV-2 RNA is generally detectable in upper respiratoy specimens during the acute phase of infection. The lowest concentration of SARS-CoV-2 viral copies this assay can detect is 131 copies/mL. A negative result does not preclude SARS-Cov-2 infection and should not be used as the sole basis for treatment or other patient management decisions. A negative result may occur with  improper specimen collection/handling, submission of specimen other than nasopharyngeal swab, presence of viral  mutation(s) within the areas targeted by this assay, and inadequate number of viral copies (<131 copies/mL). A negative result must be combined with clinical observations, patient history, and epidemiological information. The expected result is Negative.  Fact Sheet for Patients:  PinkCheek.be  Fact Sheet for Healthcare Providers:  GravelBags.it  This test is no t yet approved or cleared by the [place] FDA and  has been authorized for detection and/or diagnosis of SARS-CoV-2 by FDA under an Emergency Use Authorization (EUA). This EUA will remain  in effect (meaning this test can be used) for the duration of the COVID-19 declaration under  Section 564(b)(1) of the Act, 21 U.S.C. section 360bbb-3(b)(1), unless the authorization is terminated or revoked sooner.     Influenza A by PCR NEGATIVE NEGATIVE Final   Influenza B by PCR NEGATIVE NEGATIVE Final    Comment: (NOTE) The Xpert Xpress SARS-CoV-2/FLU/RSV assay is intended as an aid in  the diagnosis of influenza from Nasopharyngeal swab specimens and  should not be used as a sole basis for treatment. Nasal washings and  aspirates are unacceptable for Xpert Xpress SARS-CoV-2/FLU/RSV  testing.  Fact Sheet for Patients: PinkCheek.be  Fact Sheet for Healthcare Providers: GravelBags.it  This test is not yet approved or cleared by the [place] FDA and  has been authorized for detection and/or diagnosis of SARS-CoV-2 by  FDA under an Emergency Use Authorization (EUA). This EUA will remain  in effect (meaning this test can be used) for the duration of the  Covid-19 declaration under Section 564(b)(1) of the Act, 21  U.S.C. section 360bbb-3(b)(1), unless the authorization is  terminated or revoked. Performed at [facility], [address] [place], [place] [address]   Blood culture (routine x 2)     Status: None (Preliminary result)   Collection Time: 09/21/20  8:44 AM   Specimen: BLOOD  Result Value Ref Range Status   Specimen Description BLOOD LEFT ANTECUBITAL  Final   Special Requests   Final    BOTTLES DRAWN AEROBIC AND ANAEROBIC Blood Culture adequate volume   Culture   Final    NO GROWTH < 24 HOURS Performed at [facility], [address] [place], [place] [address]    Report Status PENDING  Incomplete  Blood culture (routine x 2)     Status: None (Preliminary result)   Collection Time: 09/21/20  8:44 AM   Specimen: BLOOD  Result Value Ref Range Status   Specimen Description BLOOD BLOOD RIGHT HAND  Final   Special Requests   Final    BOTTLES DRAWN AEROBIC AND ANAEROBIC Blood  Culture adequate volume   Culture   Final    NO GROWTH < 24 HOURS Performed at [facility], [address], [place], [place] [address]    Report Status PENDING  Incomplete  MRSA PCR Screening     Status: None   Collection Time: 09/22/20  8:28 AM   Specimen: Nasopharyngeal  Result Value Ref Range Status   MRSA by PCR NEGATIVE NEGATIVE Final    Comment:        The GeneXpert MRSA Assay (FDA approved for NASAL specimens only), is one component of a comprehensive MRSA colonization surveillance program. It is not intended to diagnose MRSA infection nor to guide or monitor treatment for MRSA infections. Performed at [facility], [address] [place], [place] [address]      Labs: BNP (last 3 results) Recent Labs    09/21/20 0557  BNP 37.0  Basic Metabolic Panel: Recent Labs  Lab 09/21/20 0557 09/22/20 0455  NA 136 138  K 3.7 4.1  CL 100 103  CO2 26 27  GLUCOSE 126* 120*  BUN 11 13  CREATININE 0.91 0.74  CALCIUM 9.0 8.8*   Liver Function Tests: No results for input(s): AST, ALT, ALKPHOS, BILITOT, PROT, ALBUMIN in the last 168 hours. No results for input(s): LIPASE, AMYLASE in the last 168 hours. No results for input(s): AMMONIA in the last 168 hours. CBC: Recent Labs  Lab 09/21/20 0557 09/22/20 0455  WBC 10.6* 14.1*  NEUTROABS 8.3*  --   HGB 13.6 11.9*  HCT 41.4 35.7*  MCV 88.1 88.4  PLT 294 295   Cardiac Enzymes: No results for input(s): CKTOTAL, CKMB, CKMBINDEX, TROPONINI in the last 168 hours. BNP: Invalid input(s): POCBNP CBG: No results for input(s): GLUCAP in the last 168 hours. D-Dimer No results for input(s): DDIMER in the last 72 hours. Hgb A1c No results for input(s): HGBA1C in the last 72 hours. Lipid Profile No results for input(s): CHOL, HDL, LDLCALC, TRIG, CHOLHDL, LDLDIRECT in the last 72 hours. Thyroid function studies No results for input(s): TSH, T4TOTAL, T3FREE, THYROIDAB in the last 72 hours.  Invalid  input(s): FREET3 Anemia work up No results for input(s): VITAMINB12, FOLATE, FERRITIN, TIBC, IRON, RETICCTPCT in the last 72 hours. Urinalysis    Component Value Date/Time   COLORURINE YELLOW (A) 07/03/2018 1130   APPEARANCEUR CLEAR (A) 07/03/2018 1130   APPEARANCEUR Cloudy 12/12/2012 1545   LABSPEC 1.008 07/03/2018 1130   LABSPEC 1.025 12/12/2012 1545   PHURINE 6.0 07/03/2018 1130   GLUCOSEU NEGATIVE 07/03/2018 1130   GLUCOSEU Negative 12/12/2012 1545   HGBUR NEGATIVE 07/03/2018 1130   BILIRUBINUR negative 09/21/2019 1554   BILIRUBINUR Negative 12/12/2012 1545   KETONESUR NEGATIVE 07/03/2018 1130   PROTEINUR Negative 09/21/2019 1554   PROTEINUR NEGATIVE 07/03/2018 1130   UROBILINOGEN 0.2 09/21/2019 1554   NITRITE negative 09/21/2019 1554   NITRITE NEGATIVE 07/03/2018 1130   LEUKOCYTESUR Negative 09/21/2019 1554   LEUKOCYTESUR Negative 12/12/2012 1545   Sepsis Labs Invalid input(s): PROCALCITONIN,  WBC,  LACTICIDVEN Microbiology Recent Results (from the past 240 hour(s))  Respiratory Panel by RT PCR (Flu A&B, Covid) - Nasopharyngeal Swab     Status: None   Collection Time: 09/21/20  6:28 AM   Specimen: Nasopharyngeal Swab  Result Value Ref Range Status   SARS Coronavirus 2 by RT PCR NEGATIVE NEGATIVE Final    Comment: (NOTE) SARS-CoV-2 target nucleic acids are NOT DETECTED.  The SARS-CoV-2 RNA is generally detectable in upper respiratoy specimens during the acute phase of infection. The lowest concentration of SARS-CoV-2 viral copies this assay can detect is 131 copies/mL. A negative result does not preclude SARS-Cov-2 infection and should not be used as the sole basis for treatment or other patient management decisions. A negative result may occur with  improper specimen collection/handling, submission of specimen other than nasopharyngeal swab, presence of viral mutation(s) within the areas targeted by this assay, and inadequate number of viral copies (<131  copies/mL). A negative result must be combined with clinical observations, patient history, and epidemiological information. The expected result is Negative.  Fact Sheet for Patients:  PinkCheek.be  Fact Sheet for Healthcare Providers:  GravelBags.it  This test is no t yet approved or cleared by the [place] FDA and  has been authorized for detection and/or diagnosis of SARS-CoV-2 by FDA under an Emergency Use Authorization (EUA). This EUA will remain  in effect (meaning  this test can be used) for the duration of the COVID-19 declaration under Section 564(b)(1) of the Act, 21 U.S.C. section 360bbb-3(b)(1), unless the authorization is terminated or revoked sooner.     Influenza A by PCR NEGATIVE NEGATIVE Final   Influenza B by PCR NEGATIVE NEGATIVE Final    Comment: (NOTE) The Xpert Xpress SARS-CoV-2/FLU/RSV assay is intended as an aid in  the diagnosis of influenza from Nasopharyngeal swab specimens and  should not be used as a sole basis for treatment. Nasal washings and  aspirates are unacceptable for Xpert Xpress SARS-CoV-2/FLU/RSV  testing.  Fact Sheet for Patients: PinkCheek.be  Fact Sheet for Healthcare Providers: GravelBags.it  This test is not yet approved or cleared by the [place] FDA and  has been authorized for detection and/or diagnosis of SARS-CoV-2 by  FDA under an Emergency Use Authorization (EUA). This EUA will remain  in effect (meaning this test can be used) for the duration of the  Covid-19 declaration under Section 564(b)(1) of the Act, 21  U.S.C. section 360bbb-3(b)(1), unless the authorization is  terminated or revoked. Performed at [facility], [address] [place], [place] [address]   Blood culture (routine x 2)     Status: None (Preliminary result)   Collection Time: 09/21/20  8:44 AM   Specimen: BLOOD   Result Value Ref Range Status   Specimen Description BLOOD LEFT ANTECUBITAL  Final   Special Requests   Final    BOTTLES DRAWN AEROBIC AND ANAEROBIC Blood Culture adequate volume   Culture   Final    NO GROWTH < 24 HOURS Performed at [facility], [address], [place], [place] [address]    Report Status PENDING  Incomplete  Blood culture (routine x 2)     Status: None (Preliminary result)   Collection Time: 09/21/20  8:44 AM   Specimen: BLOOD  Result Value Ref Range Status   Specimen Description BLOOD BLOOD RIGHT HAND  Final   Special Requests   Final    BOTTLES DRAWN AEROBIC AND ANAEROBIC Blood Culture adequate volume   Culture   Final    NO GROWTH < 24 HOURS Performed at [facility], [address], [place], [place] [address]    Report Status PENDING  Incomplete  MRSA PCR Screening     Status: None   Collection Time: 09/22/20  8:28 AM   Specimen: Nasopharyngeal  Result Value Ref Range Status   MRSA by PCR NEGATIVE NEGATIVE Final    Comment:        The GeneXpert MRSA Assay (FDA approved for NASAL specimens only), is one component of a comprehensive MRSA colonization surveillance program. It is not intended to diagnose MRSA infection nor to guide or monitor treatment for MRSA infections. Performed at [facility], [address] [place], [place] [id number]      Time coordinating discharge: Over 30 minutes  SIGNED:   Sidney Ace, MD  [facility] 09/22/2020, 12:59 PM Pager   If 7PM-7AM, please contact night-coverage

## 2020-09-26 LAB — CULTURE, BLOOD (ROUTINE X 2)
Culture: NO GROWTH
Culture: NO GROWTH
Special Requests: ADEQUATE
Special Requests: ADEQUATE

## 2020-09-27 ENCOUNTER — Encounter: Payer: Self-pay | Admitting: Family Medicine

## 2020-09-27 ENCOUNTER — Ambulatory Visit (INDEPENDENT_AMBULATORY_CARE_PROVIDER_SITE_OTHER): Payer: BC Managed Care – PPO | Admitting: Family Medicine

## 2020-09-27 ENCOUNTER — Other Ambulatory Visit: Payer: Self-pay

## 2020-09-27 VITALS — BP 130/74 | HR 57 | Temp 97.8°F | Ht 64.0 in | Wt 176.4 lb

## 2020-09-27 DIAGNOSIS — J449 Chronic obstructive pulmonary disease, unspecified: Secondary | ICD-10-CM | POA: Diagnosis not present

## 2020-09-27 DIAGNOSIS — K1121 Acute sialoadenitis: Secondary | ICD-10-CM

## 2020-09-27 DIAGNOSIS — Z87891 Personal history of nicotine dependence: Secondary | ICD-10-CM | POA: Diagnosis not present

## 2020-09-27 NOTE — Assessment & Plan Note (Signed)
Continues intermittent vaping.

## 2020-09-27 NOTE — Patient Instructions (Addendum)
Finish antibiotics and steroids. Expect full recovery with noted improvement each day - let me know if that's not the case.  Fully quit vaping.   Parotitis  Parotitis is inflammation of one or both of your parotid glands. These glands produce saliva. They are found on each side of your face, below and in front of your earlobes. The saliva that they produce comes out of tiny openings (ducts) inside your cheeks. Parotitis may cause sudden swelling and pain (acute parotitis). It can also cause repeated episodes of swelling and pain or continued swelling that may or may not be painful (chronic parotitis). What are the causes? This condition may be caused by:  Infections from bacteria.  Infections from viruses, such as mumps or HIV.  Blockage (obstruction) of saliva flow through the parotid glands. This can be from a stone, scar tissue, or a tumor.  Diseases that cause your body's defense system (immune system) to attack healthy cells in your salivary glands. These are called autoimmune diseases. What increases the risk? You are more likely to develop this condition if:  You are 3 years old or older.  You do not drink enough fluids (are dehydrated).  You drink too much alcohol.  You have: ? A dry mouth. ? Poor dental hygiene. ? Diabetes. ? Gout. ? A long-term illness.  You have had radiation treatments to the head and neck.  You take certain medicines. What are the signs or symptoms? Symptoms of this condition depend on the cause. Symptoms may include:  Swelling under and in front of the ear. This may get worse after eating.  Redness of the skin over the parotid gland.  Pain and tenderness over the parotid gland. This may get worse after eating.  Fever or chills.  Pus coming from the ducts inside the mouth.  Dry mouth.  A bad taste in the mouth. How is this diagnosed? This condition may be diagnosed based on:  Your medical history.  A physical exam.  Tests to  find the cause of the parotitis. These may include: ? Doing blood tests to check for an autoimmune disease or infections from a virus. ? Taking a fluid sample from the parotid gland and testing it for infection. ? Injecting the ducts of the parotid gland with a dye and then taking X-rays (sialogram). ? Having other imaging tests of the gland, such as X-rays, ultrasound, MRI, or CT scan. ? Checking the opening of the gland for a stone or obstruction. ? Placing a needle into the gland to remove tissue for a biopsy (fine needle aspiration). How is this treated? Treatment for this condition depends on the cause. Treatment may include:  Antibiotic medicine for a bacterial infection.  Drinking more fluids.  Removing a stone or obstruction.  Treating an underlying disease that is causing parotitis.  Surgery to drain an infection, remove a growth, or remove the whole gland (parotidectomy). Treatment may not be needed if parotid swelling goes away with home care. Follow these instructions at home: Medicines   Take over-the-counter and prescription medicines only as told by your health care provider.  If you were prescribed an antibiotic medicine, take it as told by your health care provider. Do not stop taking the antibiotic even if you start to feel better. Managing pain and swelling  If directed, apply heat to the affected area as often as told by your health care provider. Use the heat source that your health care provider recommends, such as a moist heat pack  or a heating pad. To apply the heat: ? Place a towel between your skin and the heat source. ? Leave the heat on for 20-30 minutes. ? Remove the heat if your skin turns bright red. This is especially important if you are unable to feel pain, heat, or cold. You may have a greater risk of getting burned.  Gargle with a salt-water mixture 3-4 times a day or as needed. To make a salt-water mixture, completely dissolve -1 tsp (3-6 g) of  salt in 1 cup (237 mL) of warm water.  Gently massage the parotid glands as told by your health care provider. General instructions   Drink enough fluid to keep your urine pale yellow.  Keep your mouth clean and moist.  Try sucking on sour candy. This may help to make your mouth less dry by stimulating the flow of saliva.  Maintain good oral health. ? Brush your teeth at least two times a day. ? Floss your teeth every day. ? See your dentist regularly.  Do not use any products that contain nicotine or tobacco, such as cigarettes, e-cigarettes, and chewing tobacco. If you need help quitting, ask your health care provider.  Do not drink alcohol.  Keep all follow-up visits as told by your health care provider. This is important. Contact a health care provider if:  You have a fever or chills.  You have new symptoms.  Your symptoms get worse.  Your symptoms do not improve with treatment. Get help right away if:  You have difficulty breathing or swallowing because of the swollen gland. Summary  Parotitis is inflammation of one or both of your parotid glands.  Symptoms include pain and swelling under and in front of the ear. They may also include a fever and a bad taste in your mouth.  This condition may be treated with antibiotics, increasing fluids, or surgery.  In some cases, parotitis may go away on its own without treatment.  You should drink plenty of fluids, maintain good oral hygiene, and avoid tobacco products. This information is not intended to replace advice given to you by your health care provider. Make sure you discuss any questions you have with your health care provider. Document Revised: 05/19/2018 Document Reviewed: 05/19/2018 Elsevier Patient Education  [place].

## 2020-09-27 NOTE — Assessment & Plan Note (Signed)
Evidence of centrilobular emphysema incidentally found on recent neck CT with LUL scarring (side of XRT for L breast cancer). Ex smoker (quit 2017), encouraged fully quitting vaping. Pt asxs off respiratory medication.

## 2020-09-27 NOTE — Assessment & Plan Note (Signed)
Hospital records reviewed. Reviewed pathophysiology of acute parotitis, presumed suppurative bacterial. Continues improve as expected.  Blcx NG final x2.  Pt will complete augmentin course + medrol dose pack.  Update if not fully resolved after this.

## 2020-09-27 NOTE — Progress Notes (Signed)
Patient ID: Sabrina Wright, female    DOB: 27-Aug-1961, 59 y.o.   MRN: [id number]  This visit was conducted in person.  BP 130/74 (BP Location: Right Arm, Patient Position: Sitting, Cuff Size: Normal)   Pulse (!) 57   Temp 97.8 F (36.6 C) (Temporal)   Ht 5\' 4"  (1.626 m)   Wt 176 lb 6 oz (80 kg)   LMP 02/14/2005 (Approximate)   SpO2 97%   BMI 30.27 kg/m    CC: hosp f/u visit  Subjective:   HPI: Sabrina Wright is a 59 y.o. female presenting on 09/27/2020 for Hospitalization Follow-up (Admitted for left-side facial swelling. )   Recent hospitalization for acute L parotitis treated with IV decadron 10mg  and IV vanc/unasyn. CT scan did not show abscess or fluid collection or mass or stone - no need for surgery. Discharged on medrol dose pack and augmentin 10d course.   She never had fever or significant dysphagia.  Notes increase drooling.  Blcx NG x2 final.  She had recently had 2nd shingrix vaccine (09/19/2020).  Notes increased fatigue recently.   CT incidentally showed L lung apex scarring and centrilobular emphysema and R parotid atrophy since 2006.  Quit cigarettes 3 yrs ago. Continues vaping some.    Admit date: 09/21/2020 Discharge date: 09/22/2020 TCM hosp f/u phone call not completed.   Admitted From: Home Disposition: Home  Recommendations for Outpatient Follow-up:  1. Follow up with PCP in 1-2 weeks  Discharge Diagnoses:  Principal Problem:   Acute parotitis Active Problems:   Hx of migraines   Depression with anxiety  Home Health: No Equipment/Devices: None Discharge Condition: Stable CODE STATUS: Full Diet recommendation: Heart Healthy     Relevant past medical, surgical, family and social history reviewed and updated as indicated. Interim medical history since our last visit reviewed. Allergies and medications reviewed and updated. Outpatient Medications Prior to Visit  Medication Sig Dispense Refill  . acetaminophen (TYLENOL) 325 MG tablet  Take 2 tablets (650 mg total) by mouth every 6 (six) hours as needed for fever, headache or moderate pain.    Marland Kitchen ALPRAZolam (XANAX) 0.5 MG tablet Take 0.5-1 tablets (0.25-0.5 mg total) by mouth 2 (two) times daily as needed for anxiety. 20 tablet 1  . amoxicillin-clavulanate (AUGMENTIN) 875-125 MG tablet Take 1 tablet by mouth 2 (two) times daily for 9 days. 18 tablet 0  . Calcium Carb-Cholecalciferol (CALCIUM 600/VITAMIN D3) 600-800 MG-UNIT TABS Take 2 tablets by mouth daily. 60 tablet   . Cholecalciferol (VITAMIN D) 50 MCG (2000 UT) CAPS Take 1 capsule (2,000 Units total) by mouth daily. 30 capsule   . citalopram (CELEXA) 20 MG tablet Take 1 tablet (20 mg total) by mouth daily. 90 tablet 3  . clobetasol cream (TEMOVATE) 4.03 % APPLY 1 APPLICATION TOPICALLY 2 (TWO) TIMES DAILY. APPLY TO AFFECTED AREA NO MORE THAN 2 WEEKS AT A TIME+ (Patient taking differently: Apply 1 application topically 2 (two) times daily as needed (rash). APPLY TO AFFECTED AREA NO MORE THAN 2 WEEKS AT A TIME) 30 g 0  . denosumab (PROLIA) 60 MG/ML SOSY injection INJECT 60MG  SUBCUTANEOUSLY  EVERY 6 MONTHS (GIVEN AT  PRESCRIBERS OFFICE) (Patient taking differently: Inject 60 mg into the skin every 6 (six) months. INJECT 60MG  SUBCUTANEOUSLY  EVERY 6 MONTHS (GIVEN AT  PRESCRIBERS OFFICE)) 1 mL 1  . fluconazole (DIFLUCAN) 150 MG tablet Take 1 tablet (150 mg total) by mouth daily as needed for up to 7 days. 7 tablet 0  .  meloxicam (MOBIC) 7.5 MG tablet TAKE 1 TABLET BY MOUTH EVERY DAY (Patient taking differently: Take 15 mg by mouth daily. ) 30 tablet 6  . methylPREDNISolone (MEDROL DOSEPAK) 4 MG TBPK tablet Take as per package directions 1 each 0  . metoprolol tartrate (LOPRESSOR) 25 MG tablet Take 25 mg (1 tablet) TWO hours prior to CT 1 tablet 0  . Multiple Vitamin (MULTIVITAMIN WITH MINERALS) TABS tablet Take 1 tablet by mouth at bedtime.    . promethazine (PHENERGAN) 25 MG tablet Take 1 tablet (25 mg total) by mouth every 8 (eight)  hours as needed for nausea or vomiting. Caution of sedation 20 tablet 0  . SUMAtriptan (IMITREX) 100 MG tablet TAKE 1 TABLET BY MOUTH ONCE FOR 1 DOSE. MAY REPEAT IN 2 HOURS IF HEADACHE PERSISTS OR RECURS. (Patient taking differently: Take 100 mg by mouth every 2 (two) hours as needed for headache. TAKE 1 TABLET BY MOUTH ONCE FOR 1 DOSE. MAY REPEAT IN 2 HOURS IF HEADACHE PERSISTS OR RECURS.) 9 tablet 3  . vitamin B-12 (V-R VITAMIN B-12) 500 MCG tablet Take 1 tablet (500 mcg total) by mouth daily.     No facility-administered medications prior to visit.     Per HPI unless specifically indicated in ROS section below Review of Systems Objective:  BP 130/74 (BP Location: Right Arm, Patient Position: Sitting, Cuff Size: Normal)   Pulse (!) 57   Temp 97.8 F (36.6 C) (Temporal)   Ht 5\' 4"  (1.626 m)   Wt 176 lb 6 oz (80 kg)   LMP 02/14/2005 (Approximate)   SpO2 97%   BMI 30.27 kg/m   Wt Readings from Last 3 Encounters:  09/27/20 176 lb 6 oz (80 kg)  09/21/20 170 lb (77.1 kg)  08/08/20 173 lb (78.5 kg)      Physical Exam Vitals and nursing note reviewed.  Constitutional:      Appearance: Normal appearance. She is not ill-appearing.  HENT:     Head: Normocephalic.     Jaw: There is normal jaw occlusion. Tenderness present. No trismus or pain on movement.     Salivary Glands: Right salivary gland is not diffusely enlarged or tender. Left salivary gland is diffusely enlarged and tender.      Comments: Discomfort and swelling present to L neck inferior to ankle of jaw, but without significant swelling at parotid     Right Ear: Tympanic membrane, ear canal and external ear normal. There is no impacted cerumen.     Left Ear: Tympanic membrane, ear canal and external ear normal. There is no impacted cerumen.     Mouth/Throat:     Mouth: Mucous membranes are moist.     Pharynx: Oropharynx is clear. No oropharyngeal exudate or posterior oropharyngeal erythema.  Cardiovascular:     Rate and  Rhythm: Normal rate and regular rhythm.     Pulses: Normal pulses.     Heart sounds: Normal heart sounds. No murmur heard.   Pulmonary:     Effort: Pulmonary effort is normal. No respiratory distress.     Breath sounds: Normal breath sounds. No wheezing, rhonchi or rales.  Musculoskeletal:     Cervical back: Normal range of motion and neck supple.     Right lower leg: No edema.     Left lower leg: No edema.  Lymphadenopathy:     Head:     Right side of head: No submental, submandibular, tonsillar, preauricular or posterior auricular adenopathy.     Left side of  head: Submandibular, tonsillar and posterior auricular adenopathy present. No submental or preauricular adenopathy.     Cervical: Cervical adenopathy (L sided) present.     Right cervical: No superficial or posterior cervical adenopathy.    Left cervical: Superficial cervical adenopathy and posterior cervical adenopathy present.     Upper Body:     Right upper body: No supraclavicular adenopathy.     Left upper body: No supraclavicular adenopathy.     Comments: Mild L sided LAD after recent parotitis  Skin:    General: Skin is warm and dry.     Findings: No erythema or rash.  Neurological:     Mental Status: She is alert.  Psychiatric:        Mood and Affect: Mood normal.        Behavior: Behavior normal.       Assessment & Plan:  This visit occurred during the SARS-CoV-2 public health emergency.  Safety protocols were in place, including screening questions prior to the visit, additional usage of staff PPE, and extensive cleaning of exam room while observing appropriate contact time as indicated for disinfecting solutions.   Problem List Items Addressed This Visit    Ex-smoker    Continues intermittent vaping.       COPD (chronic obstructive pulmonary disease) (HCC)    Evidence of centrilobular emphysema incidentally found on recent neck CT with LUL scarring (side of XRT for L breast cancer). Ex smoker (quit 2017),  encouraged fully quitting vaping. Pt asxs off respiratory medication.       Acute parotitis [facility] records reviewed. Reviewed pathophysiology of acute parotitis, presumed suppurative bacterial. Continues improve as expected.  Blcx NG final x2.  Pt will complete augmentin course + medrol dose pack.  Update if not fully resolved after this.           No orders of the defined types were placed in this encounter.  No orders of the defined types were placed in this encounter.   Patient instructions: Finish antibiotics and steroids. Expect full recovery with noted improvement each day - let me know if that's not the case.  Fully quit vaping.   Follow up plan: Return if symptoms worsen or fail to improve.  [name], MD

## 2020-10-26 ENCOUNTER — Other Ambulatory Visit: Payer: Self-pay

## 2020-10-26 ENCOUNTER — Ambulatory Visit
Admission: RE | Admit: 2020-10-26 | Discharge: 2020-10-26 | Disposition: A | Discharge status: Home / Self Care | Payer: BC Managed Care – PPO | Source: Ambulatory Visit | Attending: Family Medicine | Admitting: Family Medicine

## 2020-10-26 DIAGNOSIS — Z780 Asymptomatic menopausal state: Secondary | ICD-10-CM | POA: Diagnosis not present

## 2020-10-26 DIAGNOSIS — M810 Age-related osteoporosis without current pathological fracture: Secondary | ICD-10-CM | POA: Diagnosis not present

## 2020-11-02 DIAGNOSIS — U071 COVID-19: Secondary | ICD-10-CM | POA: Diagnosis not present

## 2020-11-02 DIAGNOSIS — R6889 Other general symptoms and signs: Secondary | ICD-10-CM | POA: Diagnosis not present

## 2020-11-02 DIAGNOSIS — Z03818 Encounter for observation for suspected exposure to other biological agents ruled out: Secondary | ICD-10-CM | POA: Diagnosis not present

## 2020-11-06 ENCOUNTER — Encounter: Payer: Self-pay | Admitting: Family Medicine

## 2020-11-08 ENCOUNTER — Ambulatory Visit: Payer: BC Managed Care – PPO | Admitting: Cardiology

## 2020-11-08 NOTE — Telephone Encounter (Signed)
COVID positive patient.  Please call for update on symptoms
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
## 2020-11-15 ENCOUNTER — Encounter: Payer: Self-pay | Admitting: Cardiology

## 2020-11-15 ENCOUNTER — Ambulatory Visit (INDEPENDENT_AMBULATORY_CARE_PROVIDER_SITE_OTHER): Payer: BC Managed Care – PPO | Admitting: Cardiology

## 2020-11-15 ENCOUNTER — Other Ambulatory Visit: Payer: Self-pay

## 2020-11-15 VITALS — BP 134/80 | HR 67 | Ht 64.0 in | Wt 175.0 lb

## 2020-11-15 DIAGNOSIS — R42 Dizziness and giddiness: Secondary | ICD-10-CM

## 2020-11-15 DIAGNOSIS — R002 Palpitations: Secondary | ICD-10-CM

## 2020-11-15 DIAGNOSIS — I447 Left bundle-branch block, unspecified: Secondary | ICD-10-CM

## 2020-11-15 DIAGNOSIS — R079 Chest pain, unspecified: Secondary | ICD-10-CM

## 2020-11-15 MED ORDER — METOPROLOL TARTRATE 50 MG PO TABS: ORAL_TABLET | ORAL | 0 refills | Status: DC

## 2020-11-15 NOTE — Patient Instructions (Signed)
Medication Instructions:  Your physician recommends that you continue on your current medications as directed. Please refer to the Current Medication list given to you today.  Testing/Procedures: CCTA to be scheduled --Take metoprolol (Lopressor) 50 mg two hours prior  Follow-Up: At [facility], you and your health needs are our priority.  As part of our continuing mission to provide you with exceptional heart care, we have created designated Provider Care Teams.  These Care Teams include your primary Cardiologist (physician) and Advanced Practice Providers (APPs -  Physician Assistants and Nurse Practitioners) who all work together to provide you with the care you need, when you need it.  We recommend signing up for the patient portal called "MyChart".  Sign up information is provided on this After Visit Summary.  MyChart is used to connect with patients for Virtual Visits (Telemedicine).  Patients are able to view lab/test results, encounter notes, upcoming appointments, etc.  Non-urgent messages can be sent to your provider as well.   To learn more about what you can do with MyChart, go to [email or web address].    Your next appointment:   3 month(s)  The format for your next appointment:   In Person  Provider:   Oswaldo Milian, MD

## 2020-11-26 ENCOUNTER — Other Ambulatory Visit: Payer: Self-pay | Admitting: Family Medicine

## 2020-12-04 ENCOUNTER — Telehealth (HOSPITAL_COMMUNITY): Payer: Self-pay | Admitting: Emergency Medicine

## 2020-12-04 ENCOUNTER — Telehealth (HOSPITAL_COMMUNITY): Payer: Self-pay | Admitting: *Deleted

## 2020-12-04 NOTE — Telephone Encounter (Signed)
Attempted to call patient regarding upcoming cardiac CT appointment. °Left message on voicemail with name and callback number °Santiago Stenzel RN Navigator Cardiac Imaging °[facility] °[phone] Office °[phone] Cell ° °

## 2020-12-04 NOTE — Telephone Encounter (Signed)
Patient returning call regarding upcoming cardiac imaging study; pt verbalizes understanding of appt date/time, parking situation and where to check in, pre-test NPO status and medications ordered, and verified current allergies; name and call back number provided for further questions should they arise  Sabrina Clement RN Navigator Cardiac [facility] [phone] office [phone] cell  Pt reports left arm restriction.

## 2020-12-05 ENCOUNTER — Ambulatory Visit (HOSPITAL_COMMUNITY)
Admission: RE | Admit: 2020-12-05 | Discharge: 2020-12-05 | Disposition: A | Discharge status: Home / Self Care | Payer: BC Managed Care – PPO | Source: Ambulatory Visit | Attending: Cardiology | Admitting: Cardiology

## 2020-12-05 ENCOUNTER — Other Ambulatory Visit: Payer: Self-pay

## 2020-12-05 DIAGNOSIS — R079 Chest pain, unspecified: Secondary | ICD-10-CM | POA: Diagnosis not present

## 2020-12-05 MED ORDER — NITROGLYCERIN 0.4 MG SL SUBL
0.8000 mg | SUBLINGUAL_TABLET | Freq: Once | SUBLINGUAL | Status: AC
  Administered 2020-12-05: 0.8 mg via SUBLINGUAL

## 2020-12-05 MED ORDER — NITROGLYCERIN 0.4 MG SL SUBL
SUBLINGUAL_TABLET | SUBLINGUAL | Status: AC
  Filled 2020-12-05: qty 2

## 2020-12-05 MED ORDER — IOHEXOL 350 MG/ML SOLN
80.0000 mL | Freq: Once | INTRAVENOUS | Status: AC | PRN
  Administered 2020-12-05: 80 mL via INTRAVENOUS

## 2020-12-22 ENCOUNTER — Telehealth: Payer: Self-pay

## 2020-12-22 NOTE — Telephone Encounter (Signed)
PA needs to be completed for patients Prolia. PA form placed in Dr. Bosie Clos box for signature. Will fax when form signed.

## 2020-12-22 NOTE — Telephone Encounter (Addendum)
Signed and in my out box.  Thanks!

## 2020-12-25 NOTE — Telephone Encounter (Signed)
PA faxed

## 2021-01-02 ENCOUNTER — Telehealth: Payer: Self-pay

## 2021-01-02 ENCOUNTER — Emergency Department: Payer: BC Managed Care – PPO

## 2021-01-02 ENCOUNTER — Other Ambulatory Visit: Payer: Self-pay

## 2021-01-02 ENCOUNTER — Emergency Department
Admission: EM | Admit: 2021-01-02 | Discharge: 2021-01-02 | Disposition: A | Discharge status: Home / Self Care | Payer: BC Managed Care – PPO | Attending: Emergency Medicine | Admitting: Emergency Medicine

## 2021-01-02 DIAGNOSIS — Z87891 Personal history of nicotine dependence: Secondary | ICD-10-CM | POA: Insufficient documentation

## 2021-01-02 DIAGNOSIS — G43001 Migraine without aura, not intractable, with status migrainosus: Secondary | ICD-10-CM

## 2021-01-02 DIAGNOSIS — Z853 Personal history of malignant neoplasm of breast: Secondary | ICD-10-CM | POA: Diagnosis not present

## 2021-01-02 DIAGNOSIS — J449 Chronic obstructive pulmonary disease, unspecified: Secondary | ICD-10-CM | POA: Insufficient documentation

## 2021-01-02 DIAGNOSIS — Z79899 Other long term (current) drug therapy: Secondary | ICD-10-CM | POA: Diagnosis not present

## 2021-01-02 DIAGNOSIS — R519 Headache, unspecified: Secondary | ICD-10-CM | POA: Diagnosis not present

## 2021-01-02 MED ORDER — ONDANSETRON HCL 4 MG/2ML IJ SOLN
4.0000 mg | Freq: Once | INTRAMUSCULAR | Status: AC
  Administered 2021-01-02: 4 mg via INTRAVENOUS
  Filled 2021-01-02: qty 2

## 2021-01-02 MED ORDER — KETOROLAC TROMETHAMINE 30 MG/ML IJ SOLN
30.0000 mg | Freq: Once | INTRAMUSCULAR | Status: AC
  Administered 2021-01-02: 30 mg via INTRAVENOUS
  Filled 2021-01-02: qty 1

## 2021-01-02 MED ORDER — SODIUM CHLORIDE 0.9 % IV BOLUS
1000.0000 mL | Freq: Once | INTRAVENOUS | Status: AC
  Administered 2021-01-02: 1000 mL via INTRAVENOUS

## 2021-01-02 MED ORDER — METOCLOPRAMIDE HCL 5 MG/ML IJ SOLN
20.0000 mg | Freq: Once | INTRAVENOUS | Status: AC
  Administered 2021-01-02: 20 mg via INTRAVENOUS
  Filled 2021-01-02: qty 4

## 2021-01-02 MED ORDER — DIPHENHYDRAMINE HCL 50 MG/ML IJ SOLN
25.0000 mg | Freq: Once | INTRAMUSCULAR | Status: AC
  Administered 2021-01-02: 25 mg via INTRAVENOUS
  Filled 2021-01-02: qty 1

## 2021-01-02 NOTE — Telephone Encounter (Signed)
I spoke with pt; pt's husband is taking pt to [facility] ED per access nurse advice. Sending note to DR G.

## 2021-01-02 NOTE — ED Notes (Signed)
See triage note  Presents with migraine states h/a started about 3-4 days ago  Takes meds at home  No relief

## 2021-01-02 NOTE — Telephone Encounter (Signed)
[facility] - Client TELEPHONE ADVICE RECORD AccessNurse Patient Name: Sabrina Wright Gender: Female DOB: 1961/06/23 Age: 60 Y 10 M 22 D Return Phone Number: [phone] (Primary) Address: City/State/Zip: [place] [address] Client [facility] - Client Client Site [place] - Day Physician [name] - MD Contact Type Call Who Is Calling Patient / Member / Family / Caregiver Call Type Triage / Clinical Relationship To Patient Self Return Phone Number [phone] (Primary) Chief Complaint Vomiting Reason for Call Symptomatic / Request for [place] states, pt is having migraines x 3 days, pt is vomiting, has urinated in 8hrs. [facility], [place], [place], [place] [address] Translation No Nurse Assessment Nurse: Mancel Bale, RN, Butch Penny Date/Time Eilene Ghazi Time): 01/02/2021 9:54:37 AM Confirm and document reason for call. If symptomatic, describe symptoms. ---Caller states she has had a migraine for 3 days. She has had 5 doses of Imitrex and symptoms have not improved. She has vomited 2x this am. Does the patient have any new or worsening symptoms? ---Yes Will a triage be completed? ---Yes Related visit to physician within the last 2 weeks? ---Yes Does the PT have any chronic conditions? (i.e. diabetes, asthma, this includes High risk factors for pregnancy, etc.) ---Yes List chronic conditions. ---Migraines, Anxiety Is this a behavioral health or substance abuse call? ---No Guidelines Guideline Title Affirmed Question Affirmed Notes Nurse Date/Time (Eastern Time) Headache Severe pain in one eye Sheran Fava 01/02/2021 9:56:26 AM Disp. Time Eilene Ghazi Time) Disposition Final User 01/02/2021 9:59:51 AM Go to ED Now Yes Mancel Bale, RN, [place] Disagree/Comply Comply Caller Understands Yes PLEASE NOTE:  All timestamps contained within this report are represented as [place] Standard Time. CONFIDENTIALTY NOTICE: This fax transmission is intended only for the addressee. It contains information that is legally privileged, confidential or otherwise protected from use or disclosure. If you are not the intended recipient, you are strictly prohibited from reviewing, disclosing, copying using or disseminating any of this information or taking any action in reliance on or regarding this information. If you have received this fax in error, please notify us immediately by telephone so that we can arrange for its return to [place]. Phone: [phone], Toll-Free: [phone], Fax: [phone] Page: 2 of 2 Call Id: [id number] PreDisposition Did not know what to do Care Advice Given Per Guideline GO TO ED NOW: * You need to be seen in the Emergency Department. * Go to the ED at ___________ [place] now. Drive carefully. ANOTHER ADULT SHOULD DRIVE: * It is better and safer if another adult drives instead of you. Referrals GO TO FACILITY OTHER - SPECIFY

## 2021-01-02 NOTE — ED Notes (Signed)
States her h/a is better   Resting at present

## 2021-01-02 NOTE — ED Triage Notes (Signed)
Pt come with c/o migraine for 3-5 days now. Pt states she has hx of them and has taken medication. Pt states no relief with medication. Pt states some vomiting as well.

## 2021-01-02 NOTE — Discharge Instructions (Signed)
Follow discharge care instruction restart previous medication as needed.

## 2021-01-02 NOTE — Telephone Encounter (Signed)
Noted. Currently being seen at ER, reassuring head CT.
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
## 2021-01-03 ENCOUNTER — Other Ambulatory Visit: Payer: Self-pay

## 2021-01-03 DIAGNOSIS — Z5321 Procedure and treatment not carried out due to patient leaving prior to being seen by health care provider: Secondary | ICD-10-CM | POA: Diagnosis not present

## 2021-01-03 DIAGNOSIS — R197 Diarrhea, unspecified: Secondary | ICD-10-CM | POA: Insufficient documentation

## 2021-01-03 LAB — CBC
HCT: 39 % (ref 36.0–46.0)
Hemoglobin: 12.6 g/dL (ref 12.0–15.0)
MCH: 28.5 pg (ref 26.0–34.0)
MCHC: 32.3 g/dL (ref 30.0–36.0)
MCV: 88.2 fL (ref 80.0–100.0)
Platelets: 318 10*3/uL (ref 150–400)
RBC: 4.42 MIL/uL (ref 3.87–5.11)
RDW: 13.4 % (ref 11.5–15.5)
WBC: 11.4 10*3/uL — ABNORMAL HIGH (ref 4.0–10.5)
nRBC: 0 % (ref 0.0–0.2)

## 2021-01-03 LAB — COMPREHENSIVE METABOLIC PANEL
ALT: 57 U/L — ABNORMAL HIGH (ref 0–44)
AST: 60 U/L — ABNORMAL HIGH (ref 15–41)
Albumin: 4 g/dL (ref 3.5–5.0)
Alkaline Phosphatase: 62 U/L (ref 38–126)
Anion gap: 7 (ref 5–15)
BUN: 15 mg/dL (ref 6–20)
CO2: 27 mmol/L (ref 22–32)
Calcium: 9.5 mg/dL (ref 8.9–10.3)
Chloride: 104 mmol/L (ref 98–111)
Creatinine, Ser: 0.77 mg/dL (ref 0.44–1.00)
GFR, Estimated: >60 mL/min (ref >60)
Glucose, Bld: 100 mg/dL — ABNORMAL HIGH (ref 70–99)
Potassium: 3.9 mmol/L (ref 3.5–5.1)
Sodium: 138 mmol/L (ref 135–145)
Total Bilirubin: 0.9 mg/dL (ref 0.3–1.2)
Total Protein: 7.1 g/dL (ref 6.5–8.1)

## 2021-01-03 LAB — URINALYSIS, COMPLETE (UACMP) WITH MICROSCOPIC
Bilirubin Urine: NEGATIVE
Glucose, UA: NEGATIVE mg/dL
Ketones, ur: NEGATIVE mg/dL
Leukocytes,Ua: NEGATIVE
Nitrite: NEGATIVE
Protein, ur: NEGATIVE mg/dL
Specific Gravity, Urine: 1.014 (ref 1.005–1.030)
pH: 5 (ref 5.0–8.0)

## 2021-01-03 LAB — LIPASE, BLOOD: Lipase: 27 U/L (ref 11–51)

## 2021-01-03 NOTE — ED Triage Notes (Signed)
Pt states since 0100 has had diarrhea and noticed some blood tinged stool and now states amount of blood has increased and has noticed clots. No hx of the same, denies any blood thinners. Denies any GI history. States has had approx 7-8 episodes of bloody diarrhea. Denies any vomiting or pain.

## 2021-01-04 ENCOUNTER — Emergency Department
Admission: EM | Admit: 2021-01-04 | Discharge: 2021-01-04 | Disposition: A | Discharge status: Home / Self Care | Payer: BC Managed Care – PPO | Attending: Emergency Medicine | Admitting: Emergency Medicine

## 2021-01-08 NOTE — Telephone Encounter (Signed)
PA re-submitted and faxed.

## 2021-01-15 NOTE — Telephone Encounter (Addendum)
PA approved. Out of pocket expense will be $250 or patient can get co pay card for $25 but would need a PA with cover my meds for this to be done at the pharmacy. Needs to be sent to [facility].

## 2021-01-28 ENCOUNTER — Other Ambulatory Visit: Payer: Self-pay | Admitting: Family Medicine

## 2021-01-29 NOTE — Telephone Encounter (Signed)
Last filled 09-29-20 #20 Last OV 09-17-20 Acute Next OV 07-04-21 [facility]

## 2021-01-30 NOTE — Telephone Encounter (Signed)
Just received this message today. Working on PA for patient to be done through the pharmacy. Left message for patient to call me back with her husband to discuss and make sure that she does not have the co pay card yet.

## 2021-01-30 NOTE — Telephone Encounter (Signed)
ERx 

## 2021-01-30 NOTE — Telephone Encounter (Signed)
Were you able to help her?

## 2021-01-31 NOTE — Telephone Encounter (Signed)
PA approved: Request Reference Number: [id number]. PROLIA SOL 60MG /ML is approved through 02/01/2023. Your patient may now fill this prescription and it will be covered.

## 2021-02-02 NOTE — Telephone Encounter (Signed)
Spoke with patient and advised of the cost and getting PA approved. Patient asked to have [place] set her up for co pay card because the one from last year she threw away. Need to [facility] to [facility] for Prolia. Patient is aware that we would call her back to schedule for lab and nurse visit appointment. Larene [place], can you help me set up the co pay card for the patient? And help with the rest listed above? Thank you
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
## 2021-02-06 NOTE — Telephone Encounter (Signed)
Prolia [employer] pay card activated for the patient. I called [facility] and provided RX for Prolia and the co pay savings card information. Patient does need to give consent to having this medication shipped to [place]. We called patient while on the phone with [facility] but had to leave a message for the patient to call back to authorize this. If patient authorizes this then Prolia will be here at our office on 02/08/21.   Prolia card information:   ID [id number] Group [id number] PCN [id number] BIN [id number] Card # [id number] CVC: 480 EXP: 01/02/2024  There were several issues with getting this information processed with [facility]. They will try to re run co pay card again in about an hour but at first it was not taking this information. I confirmed with [employer] assistance representative x 2 that the above information is correct.  Dr Darnell Level, Patient had labs on 01/03/21 is it ok to use those results or do we need to re do labs again? Per those results  94.63 mL/min is your estimated creatinine clearance for the patient. Thank you

## 2021-02-06 NOTE — Telephone Encounter (Signed)
Calcium level 9.5-normal also. Spoke with [facility] representative and confirmed delivery for prolia to our office and they have received confirmation from the patient to send it here. Will call patient to schedule nurse visit once medication is received. Estimated delivery date is 02/08/21

## 2021-02-06 NOTE — Telephone Encounter (Addendum)
Ok to do from my standpoint given normal GFR.

## 2021-02-08 NOTE — Telephone Encounter (Signed)
Left message for patient to call me back to be scheduled for nurse visit

## 2021-02-08 NOTE — Telephone Encounter (Signed)
Medication has been received from [facility].

## 2021-02-09 ENCOUNTER — Ambulatory Visit (INDEPENDENT_AMBULATORY_CARE_PROVIDER_SITE_OTHER): Payer: BC Managed Care – PPO | Admitting: Cardiology

## 2021-02-09 ENCOUNTER — Other Ambulatory Visit: Payer: Self-pay

## 2021-02-09 ENCOUNTER — Encounter: Payer: Self-pay | Admitting: Cardiology

## 2021-02-09 VITALS — BP 136/71 | HR 60 | Ht 64.0 in | Wt 181.0 lb

## 2021-02-09 DIAGNOSIS — R42 Dizziness and giddiness: Secondary | ICD-10-CM

## 2021-02-09 DIAGNOSIS — R079 Chest pain, unspecified: Secondary | ICD-10-CM

## 2021-02-09 DIAGNOSIS — I447 Left bundle-branch block, unspecified: Secondary | ICD-10-CM

## 2021-02-09 DIAGNOSIS — R002 Palpitations: Secondary | ICD-10-CM
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
## 2021-02-09 NOTE — Patient Instructions (Signed)

## 2021-02-14 ENCOUNTER — Ambulatory Visit (INDEPENDENT_AMBULATORY_CARE_PROVIDER_SITE_OTHER): Payer: BC Managed Care – PPO

## 2021-02-14 DIAGNOSIS — M810 Age-related osteoporosis without current pathological fracture: Secondary | ICD-10-CM | POA: Diagnosis not present

## 2021-02-14 MED ORDER — DENOSUMAB 60 MG/ML ~~LOC~~ SOSY
60.0000 mg | PREFILLED_SYRINGE | Freq: Once | SUBCUTANEOUS | Status: AC
  Administered 2021-02-14: 60 mg via SUBCUTANEOUS

## 2021-02-14 NOTE — Telephone Encounter (Signed)
Called again and spoke with Mr. Eckmann, patient's husband, asked to have patient call me back to my direct number

## 2021-02-14 NOTE — Telephone Encounter (Signed)
Spoke with patient. Patient is scheduled for today for her injection 02/14/21-do not charge patient for the medication note has been added to the appointment notes

## 2021-02-14 NOTE — Progress Notes (Signed)
Per orders of Dr. Gutierrez, injection of Prolia, given by Krisann Mckenna G Jori Frerichs. Patient tolerated injection well.  

## 2021-02-27 ENCOUNTER — Other Ambulatory Visit: Payer: Self-pay | Admitting: Family Medicine

## 2021-02-27 NOTE — Telephone Encounter (Signed)
Imitrex Last filled:  01/25/21, #9 Last OV:  09/27/20, hosp f/u Next OV:  07/04/21, CPE

## 2021-03-02 NOTE — Telephone Encounter (Signed)
ERx 

## 2021-03-05 ENCOUNTER — Encounter: Payer: Self-pay | Admitting: Gastroenterology

## 2021-04-17 ENCOUNTER — Other Ambulatory Visit: Payer: Self-pay | Admitting: Family Medicine

## 2021-04-18 NOTE — Telephone Encounter (Deleted)
Name of Medication: Alprazolam Name of Pharmacy: [facility] Last Fill or Written Date and Quantity:  Last Office Visit and Type:  Next Office Visit and Type:  Last Controlled Substance Agreement Date:  Last UDS:

## 2021-04-18 NOTE — Telephone Encounter (Signed)
Name of Medication: Alprazolam Name of Pharmacy: [facility] Last Fill or Written Date and Quantity: 01/30/21, #20 Last Office Visit and Type: 09/27/20, hosp f/u Next Office Visit and Type: 07/04/21, CPE Last Controlled Substance Agreement Date: none Last UDS: none

## 2021-04-19 NOTE — Telephone Encounter (Signed)
ERx 

## 2021-04-28 ENCOUNTER — Encounter: Payer: Self-pay | Admitting: Family Medicine

## 2021-04-30 NOTE — Telephone Encounter (Signed)
Ok to resend 07/03/20 letter but change wt limit to 5 lbs?

## 2021-04-30 NOTE — Telephone Encounter (Signed)
Ok to do - thank you.

## 2021-05-01 NOTE — Telephone Encounter (Signed)
Noted.  Sent edited letter to pt via [place].

## 2021-05-22 ENCOUNTER — Encounter: Payer: Self-pay | Admitting: Family Medicine

## 2021-05-22 ENCOUNTER — Other Ambulatory Visit: Payer: Self-pay

## 2021-05-22 ENCOUNTER — Ambulatory Visit (INDEPENDENT_AMBULATORY_CARE_PROVIDER_SITE_OTHER): Payer: BC Managed Care – PPO | Admitting: Family Medicine

## 2021-05-22 VITALS — BP 134/82 | HR 55 | Temp 98.0°F | Ht 64.0 in | Wt 176.3 lb

## 2021-05-22 DIAGNOSIS — N644 Mastodynia: Secondary | ICD-10-CM | POA: Diagnosis not present

## 2021-05-22 DIAGNOSIS — M792 Neuralgia and neuritis, unspecified: Secondary | ICD-10-CM

## 2021-05-22 DIAGNOSIS — Z853 Personal history of malignant neoplasm of breast: Secondary | ICD-10-CM

## 2021-05-22 NOTE — Progress Notes (Signed)
Patient ID: Sabrina Wright, female    DOB: 1961/07/29, 60 y.o.   MRN: [id number]  This visit was conducted in person.  BP 134/82   Pulse (!) 55   Temp 98 F (36.7 C) (Temporal)   Ht 5\' 4"  (1.626 m)   Wt 176 lb 4.8 oz (80 kg)   LMP 02/14/2005 (Approximate)   SpO2 98%   BMI 30.26 kg/m    CC: L breast pain Subjective:   HPI: Sabrina Wright is a 60 y.o. female presenting on 05/22/2021 for Acute Visit (C/o having LT breast pain/tenderness x 3 weeks.  She has been taking Meloxicam and Tylenol with no relief. )   Several weeks of left breast pain just inferior to nipple. Progressively worsening pain. No lumps or bumps. No swollen glands underarm. Denies inciting trauma/injury. No skin changes, redness or rash. Treating pain with tylenol and meloxicam without benefit. No significant caffeine use.   H/o L breast cancer s/p lumpectomy, chemo/radiation therapy 2003 [name])  [facility]  Requests work restriction for chronic pain to left medial upper arm associated with weakness of left hand without associated numbness or paresthesias described as burning pain. No forearm pain. Gabapentin wasn't effective, meloxicam 7/5mg  daily has been more effective. She has tried TCA without benefit.      Relevant past medical, surgical, family and social history reviewed and updated as indicated. Interim medical history since our last visit reviewed. Allergies and medications reviewed and updated. Outpatient Medications Prior to Visit  Medication Sig Dispense Refill   acetaminophen (TYLENOL) 325 MG tablet Take 2 tablets (650 mg total) by mouth every 6 (six) hours as needed for fever, headache or moderate pain.     ALPRAZolam (XANAX) 0.5 MG tablet TAKE 0.5-1 TABLETS (0.25-0.5 MG TOTAL) BY MOUTH 2 (TWO) TIMES DAILY AS NEEDED FOR ANXIETY. 20 tablet 0   Calcium Carb-Cholecalciferol (CALCIUM 600/VITAMIN D3) 600-800 MG-UNIT TABS Take 2 tablets by mouth daily. 60  tablet    Cholecalciferol (VITAMIN D) 50 MCG (2000 UT) CAPS Take 1 capsule (2,000 Units total) by mouth daily. 30 capsule    citalopram (CELEXA) 20 MG tablet Take 1 tablet (20 mg total) by mouth daily. 90 tablet 3   clobetasol cream (TEMOVATE) 8.93 % APPLY 1 APPLICATION TOPICALLY 2 (TWO) TIMES DAILY. APPLY TO AFFECTED AREA NO MORE THAN 2 WEEKS AT A TIME+ (Patient taking differently: Apply 1 application topically 2 (two) times daily as needed (rash). APPLY TO AFFECTED AREA NO MORE THAN 2 WEEKS AT A TIME) 30 g 0   denosumab (PROLIA) 60 MG/ML SOSY injection INJECT 60MG  SUBCUTANEOUSLY  EVERY 6 MONTHS (GIVEN AT  PRESCRIBERS OFFICE) (Patient taking differently: Inject 60 mg into the skin every 6 (six) months. INJECT 60MG  SUBCUTANEOUSLY  EVERY 6 MONTHS (GIVEN AT  PRESCRIBERS OFFICE)) 1 mL 1   meloxicam (MOBIC) 7.5 MG tablet TAKE 1 TABLET BY MOUTH EVERY DAY 30 tablet 6   Multiple Vitamin (MULTIVITAMIN WITH MINERALS) TABS tablet Take 1 tablet by mouth at bedtime.     promethazine (PHENERGAN) 25 MG tablet Take 1 tablet (25 mg total) by mouth every 8 (eight) hours as needed for nausea or vomiting. Caution of sedation 20 tablet 0   SUMAtriptan (IMITREX) 100 MG tablet TAKE 1 TABLET BY MOUTH ONCE FOR 1 DOSE. MAY REPEAT IN 2 HOURS IF HEADACHE PERSISTS OR RECURS. 9 tablet 3   vitamin B-12 (V-R VITAMIN B-12) 500 MCG tablet Take 1 tablet (500 mcg  total) by mouth daily.     No facility-administered medications prior to visit.     Per HPI unless specifically indicated in ROS section below Review of Systems  Objective:  BP 134/82   Pulse (!) 55   Temp 98 F (36.7 C) (Temporal)   Ht 5\' 4"  (1.626 m)   Wt 176 lb 4.8 oz (80 kg)   LMP 02/14/2005 (Approximate)   SpO2 98%   BMI 30.26 kg/m   Wt Readings from Last 3 Encounters:  05/22/21 176 lb 4.8 oz (80 kg)  02/09/21 181 lb (82.1 kg)  01/03/21 168 lb (76.2 kg)      Physical Exam Vitals and nursing note reviewed.  Constitutional:      Appearance: Normal  appearance. She is not ill-appearing.  Chest:     Chest wall: No mass.  Breasts:    Right: Normal. No swelling, bleeding, inverted nipple, mass, nipple discharge, skin change, tenderness, axillary adenopathy or supraclavicular adenopathy.     Left: Tenderness (diffusely) present. No swelling, bleeding, inverted nipple, mass, nipple discharge, skin change, axillary adenopathy or supraclavicular adenopathy.     Comments: Scar at previous port site on right upper chest as well as scar to right breast at prior lumpectomy site Lymphadenopathy:     Upper Body:     Right upper body: No supraclavicular, axillary or pectoral adenopathy.     Left upper body: Pectoral adenopathy (possible) present. No supraclavicular or axillary adenopathy.  Neurological:     Mental Status: She is alert.  Psychiatric:        Mood and Affect: Mood normal.        Behavior: Behavior normal.       Assessment & Plan:  This visit occurred during the SARS-CoV-2 public health emergency.  Safety protocols were in place, including screening questions prior to the visit, additional usage of staff PPE, and extensive cleaning of exam room while observing appropriate contact time as indicated for disinfecting solutions.   Problem List Items Addressed This Visit     History of left breast cancer   Relevant Orders   MM Digital Diagnostic Bilat   US BREAST LTD UNI LEFT INC AXILLA   US BREAST LTD UNI RIGHT INC AXILLA   Neuropathic pain, arm    Ongoing L medial upper arm pain manages with meloxicam 7.5mg  daily - letter for work provided today.  Gabapentin previously ineffective. lyrica was beneficial but unaffordable.  NCS 08/2016 WNL       Pain of left breast - Primary    New over the last few weeks, overall benign exam. Will order dx mammo/US for further evaluation. Continue tylenol, meloxicam, cool compresses which she benefits from.        Relevant Orders   MM Digital Diagnostic Bilat   US BREAST LTD UNI LEFT INC  AXILLA   US BREAST LTD UNI RIGHT INC AXILLA     No orders of the defined types were placed in this encounter.  Orders Placed This Encounter  Procedures   MM Digital Diagnostic Bilat    Standing Status:   Future    Standing Expiration Date:   05/22/2022    Order Specific Question:   Reason for Exam (SYMPTOM  OR DIAGNOSIS REQUIRED)    Answer:   left breast pain, h/o breast cancer    Order Specific Question:   Is the patient pregnant?    Answer:   No    Order Specific Question:   Preferred imaging location?  [facility]   US BREAST LTD UNI LEFT INC AXILLA    Standing Status:   Future    Standing Expiration Date:   05/22/2022    Order Specific Question:   Reason for Exam (SYMPTOM  OR DIAGNOSIS REQUIRED)    Answer:   left breast pain, h/o breast cancer    Order Specific Question:   Preferred imaging location?    Answer:   [facility]   US BREAST LTD UNI RIGHT INC AXILLA    Standing Status:   Future    Standing Expiration Date:   05/22/2022    Order Specific Question:   Reason for Exam (SYMPTOM  OR DIAGNOSIS REQUIRED)    Answer:   left breast pain, h/o breast cancer    Order Specific Question:   Preferred imaging location?    Answer:   [facility]     Patient Instructions  We will call you to schedule a diagnostic mammogram and ultrasound for left breast pain  Follow up plan: Return if symptoms worsen or fail to improve.  [name], MD

## 2021-05-22 NOTE — Assessment & Plan Note (Addendum)
Ongoing L medial upper arm pain manages with meloxicam 7.5mg  daily - letter for work provided today.  Gabapentin previously ineffective. lyrica was beneficial but unaffordable.  NCS 08/2016 WNL

## 2021-05-22 NOTE — Assessment & Plan Note (Signed)
New over the last few weeks, overall benign exam. Will order dx mammo/US for further evaluation. Continue tylenol, meloxicam, cool compresses which she benefits from.

## 2021-05-22 NOTE — Addendum Note (Signed)
Addended by: [name] on: 05/22/2021 04:49 PM   Modules accepted: Level of Service

## 2021-05-22 NOTE — Patient Instructions (Signed)
We will call you to schedule a diagnostic mammogram and ultrasound for left breast pain

## 2021-05-29 ENCOUNTER — Ambulatory Visit
Admission: RE | Admit: 2021-05-29 | Discharge: 2021-05-29 | Disposition: A | Discharge status: Home / Self Care | Payer: BC Managed Care – PPO | Source: Ambulatory Visit | Attending: Family Medicine | Admitting: Family Medicine

## 2021-05-29 ENCOUNTER — Other Ambulatory Visit: Payer: Self-pay | Admitting: Family Medicine

## 2021-05-29 ENCOUNTER — Other Ambulatory Visit: Payer: Self-pay

## 2021-05-29 DIAGNOSIS — Z853 Personal history of malignant neoplasm of breast: Secondary | ICD-10-CM

## 2021-05-29 DIAGNOSIS — N644 Mastodynia: Secondary | ICD-10-CM

## 2021-05-29 DIAGNOSIS — N6311 Unspecified lump in the right breast, upper outer quadrant: Secondary | ICD-10-CM | POA: Diagnosis not present

## 2021-05-29 DIAGNOSIS — R922 Inconclusive mammogram: Secondary | ICD-10-CM | POA: Diagnosis not present

## 2021-06-05 ENCOUNTER — Other Ambulatory Visit: Payer: Self-pay

## 2021-06-05 ENCOUNTER — Ambulatory Visit
Admission: RE | Admit: 2021-06-05 | Discharge: 2021-06-05 | Disposition: A | Discharge status: Home / Self Care | Payer: BC Managed Care – PPO | Source: Ambulatory Visit | Attending: Family Medicine | Admitting: Family Medicine

## 2021-06-05 ENCOUNTER — Telehealth: Payer: Self-pay

## 2021-06-05 DIAGNOSIS — N644 Mastodynia: Secondary | ICD-10-CM

## 2021-06-05 DIAGNOSIS — C50411 Malignant neoplasm of upper-outer quadrant of right female breast: Secondary | ICD-10-CM | POA: Diagnosis not present

## 2021-06-05 DIAGNOSIS — N6311 Unspecified lump in the right breast, upper outer quadrant: Secondary | ICD-10-CM | POA: Diagnosis not present

## 2021-06-05 DIAGNOSIS — Z853 Personal history of malignant neoplasm of breast: Secondary | ICD-10-CM

## 2021-06-05 NOTE — Telephone Encounter (Signed)
Prolia VOB initiated via [email or web address]  Last OV: 05/22/21 Next OV: 07/04/21 Last Prolia inj: 02/14/21 Next Prolia inj DUE: 08/17/21

## 2021-06-07 ENCOUNTER — Telehealth: Payer: Self-pay | Admitting: *Deleted

## 2021-06-07 DIAGNOSIS — C50411 Malignant neoplasm of upper-outer quadrant of right female breast: Secondary | ICD-10-CM | POA: Diagnosis not present

## 2021-06-07 NOTE — Telephone Encounter (Signed)
PA required.

## 2021-06-07 NOTE — Telephone Encounter (Signed)
   [facility] Pre-operative Risk Assessment    Patient Name: Sabrina Wright  DOB: Feb 09, 1961 MRN: [id number]  Request for surgical clearance:  What type of surgery is being performed? BREAST SURGERY  When is this surgery scheduled? TBD  What type of clearance is required (medical clearance vs. Pharmacy clearance to hold med vs. Both)? MEDICAL   Are there any medications that need to be held prior to surgery and how long?    Practice name and name of physician performing surgery? DR Pocahontas   What is the office phone number? [phone]   7.   What is the office fax number? [place] CMA   8.   Anesthesia type (None, local, MAC, general) ? GENERAL

## 2021-06-07 NOTE — Telephone Encounter (Signed)
    Patient Name: Sabrina Wright  DOB: October 14, 1961 MRN: [id number]  Primary Cardiologist: Dr. Gardiner Rhyme, MD   Chart reviewed as part of pre-operative protocol coverage. Given past medical history and time since last visit, based on ACC/AHA guidelines, Sabrina Wright would be at acceptable risk for the planned procedure without further cardiovascular testing.   She was recently seen in follow up 02/2021 at which time she did have some very atypical chest pain. Just 2 months earlier in 12/2020 she had a coronary CTA which showed a calcium score of zero and no coronary artery disease. There was no further workup recommended at that time.   The patient was advised that if she develops new symptoms prior to surgery to contact our office to arrange for a follow-up visit, and she verbalized understanding.  I will route this recommendation to the requesting party via Epic fax function and remove from pre-op pool.  Please call with questions.  Kathyrn Drown, NP 06/07/2021, 3:11 PM
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
## 2021-06-11 ENCOUNTER — Other Ambulatory Visit: Payer: Self-pay | Admitting: Family Medicine

## 2021-06-11 ENCOUNTER — Inpatient Hospital Stay: Payer: BC Managed Care – PPO | Attending: Hematology and Oncology | Admitting: Hematology and Oncology

## 2021-06-11 ENCOUNTER — Other Ambulatory Visit: Payer: Self-pay

## 2021-06-11 VITALS — BP 153/68 | HR 61 | Temp 97.3°F | Resp 18 | Ht 64.0 in | Wt 169.9 lb

## 2021-06-11 DIAGNOSIS — F419 Anxiety disorder, unspecified: Secondary | ICD-10-CM | POA: Diagnosis not present

## 2021-06-11 DIAGNOSIS — Z853 Personal history of malignant neoplasm of breast: Secondary | ICD-10-CM | POA: Insufficient documentation

## 2021-06-11 DIAGNOSIS — M898X0 Other specified disorders of bone, multiple sites: Secondary | ICD-10-CM | POA: Insufficient documentation

## 2021-06-11 DIAGNOSIS — Z923 Personal history of irradiation: Secondary | ICD-10-CM | POA: Insufficient documentation

## 2021-06-11 DIAGNOSIS — Z171 Estrogen receptor negative status [ER-]: Secondary | ICD-10-CM | POA: Diagnosis not present

## 2021-06-11 DIAGNOSIS — Z87891 Personal history of nicotine dependence: Secondary | ICD-10-CM | POA: Insufficient documentation

## 2021-06-11 DIAGNOSIS — Z79899 Other long term (current) drug therapy: Secondary | ICD-10-CM | POA: Diagnosis not present

## 2021-06-11 DIAGNOSIS — R5383 Other fatigue: Secondary | ICD-10-CM | POA: Diagnosis not present

## 2021-06-11 DIAGNOSIS — C50211 Malignant neoplasm of upper-inner quadrant of right female breast: Secondary | ICD-10-CM | POA: Diagnosis not present

## 2021-06-11 DIAGNOSIS — Z9221 Personal history of antineoplastic chemotherapy: Secondary | ICD-10-CM | POA: Insufficient documentation

## 2021-06-11 DIAGNOSIS — Z5111 Encounter for antineoplastic chemotherapy: Secondary | ICD-10-CM | POA: Insufficient documentation

## 2021-06-11 DIAGNOSIS — Z5189 Encounter for other specified aftercare: Secondary | ICD-10-CM | POA: Diagnosis not present

## 2021-06-11 DIAGNOSIS — M810 Age-related osteoporosis without current pathological fracture: Secondary | ICD-10-CM | POA: Insufficient documentation

## 2021-06-11 DIAGNOSIS — Z803 Family history of malignant neoplasm of breast: Secondary | ICD-10-CM | POA: Diagnosis not present

## 2021-06-11 DIAGNOSIS — I447 Left bundle-branch block, unspecified: Secondary | ICD-10-CM | POA: Diagnosis not present

## 2021-06-11 MED ORDER — PROCHLORPERAZINE MALEATE 10 MG PO TABS: 10.0000 mg | ORAL_TABLET | Freq: Four times a day (QID) | ORAL | 1 refills | Status: DC | PRN

## 2021-06-11 MED ORDER — ONDANSETRON HCL 8 MG PO TABS: 8.0000 mg | ORAL_TABLET | Freq: Two times a day (BID) | ORAL | 1 refills | Status: DC | PRN

## 2021-06-11 MED ORDER — LIDOCAINE-PRILOCAINE 2.5-2.5 % EX CREA: TOPICAL_CREAM | CUTANEOUS | 3 refills | Status: DC

## 2021-06-11 MED ORDER — DEXAMETHASONE 4 MG PO TABS: 4.0000 mg | ORAL_TABLET | Freq: Every day | ORAL | 0 refills | Status: DC

## 2021-06-11 NOTE — Assessment & Plan Note (Signed)
History of left breast cancer status postlumpectomy 2003 06/05/2021: Pain in the right breast: Mammogram and ultrasound revealed indeterminate masses in the right breast spanning 2.4 cm biopsy revealed grade 3 IDC ER 5%, PR 0%, Ki67 60%, HER2 negative  Pathology and radiology counseling: Discussed with the patient, the details of pathology including the type of breast cancer,the clinical staging, the significance of ER, PR and HER-2/neu receptors and the implications for treatment. After reviewing the pathology in detail, we proceeded to discuss the different treatment options between surgery, radiation, chemotherapy, antiestrogen therapies.  Treatment plan: 1.  Neoadjuvant chemotherapy with dose dense Adriamycin and Cytoxan followed by Taxol 2. breast conserving surgery with sentinel lymph node biopsy 3.  Adjuvant radiation therapy 4.  Consideration for adjuvant antiestrogen therapy with a 5% ER positivity.  Chemotherapy Counseling: I discussed the risks and benefits of chemotherapy including the risks of nausea/ vomiting, risk of infection from low WBC count, fatigue due to chemo or anemia, bruising or bleeding due to low platelets, mouth sores, loss/ change in taste and decreased appetite. Liver and kidney function will be monitored through out chemotherapy as abnormalities in liver and kidney function may be a side effect of treatment. Cardiac dysfunction due to Adriamycin were discussed in detail. Risk of permanent bone marrow dysfunction and leukemia due to chemo were also discussed.

## 2021-06-11 NOTE — Progress Notes (Signed)
START ON PATHWAY REGIMEN - Breast     A cycle is every 21 days:     Docetaxel      Cyclophosphamide   **Always confirm dose/schedule in your pharmacy ordering system**  Patient Characteristics: Preoperative or Nonsurgical Candidate (Clinical Staging), Neoadjuvant Therapy followed by Surgery, Invasive Disease, Chemotherapy, HER2 Negative/Unknown/Equivocal, ER Negative/Unknown, Platinum Therapy Not Indicated Therapeutic Status: Preoperative or Nonsurgical Candidate (Clinical Staging) AJCC M Category: cM0 AJCC Grade: G3 Breast Surgical Plan: Neoadjuvant Therapy followed by Surgery ER Status: Negative (-) AJCC 8 Stage Grouping: IIB HER2 Status: Negative (-) AJCC T Category: cT2 AJCC N Category: cN0 PR Status: Negative (-) Intent of Therapy: Curative Intent, Discussed with Patient 

## 2021-06-12 ENCOUNTER — Telehealth: Payer: Self-pay | Admitting: Emergency Medicine

## 2021-06-12 ENCOUNTER — Ambulatory Visit
Admission: RE | Admit: 2021-06-12 | Discharge: 2021-06-12 | Disposition: A | Discharge status: Home / Self Care | Payer: BC Managed Care – PPO | Source: Ambulatory Visit | Attending: Hematology and Oncology | Admitting: Hematology and Oncology

## 2021-06-12 ENCOUNTER — Other Ambulatory Visit: Payer: Self-pay | Admitting: Internal Medicine

## 2021-06-12 ENCOUNTER — Telehealth: Payer: Self-pay | Admitting: Hematology and Oncology

## 2021-06-12 ENCOUNTER — Other Ambulatory Visit: Payer: Self-pay | Admitting: *Deleted

## 2021-06-12 ENCOUNTER — Encounter: Payer: Self-pay | Admitting: *Deleted

## 2021-06-12 ENCOUNTER — Telehealth: Payer: Self-pay | Admitting: *Deleted

## 2021-06-12 DIAGNOSIS — C50411 Malignant neoplasm of upper-outer quadrant of right female breast: Secondary | ICD-10-CM | POA: Diagnosis not present

## 2021-06-12 DIAGNOSIS — Z171 Estrogen receptor negative status [ER-]: Secondary | ICD-10-CM

## 2021-06-12 MED ORDER — GADOBUTROL 1 MMOL/ML IV SOLN
8.0000 mL | Freq: Once | INTRAVENOUS | Status: AC | PRN
  Administered 2021-06-12: 8 mL via INTRAVENOUS

## 2021-06-12 NOTE — Telephone Encounter (Signed)
PT:[id number] - TREATMENT OF REFRACTORY NAUSEA  Pt was referred by MD Lindi Adie for study, ineligible d/t long standing history of palpitations/murmur and LBBB.  Pt was made aware, MD aware.  Pt denies any questions/concerns at this time.  Wells Guiles '[place], RN, BSN Clinical Research Nurse I 06/12/21 11:52 AM

## 2021-06-12 NOTE — Telephone Encounter (Signed)
Name of Medication: Alprazolam Name of Pharmacy: [facility] Last Fill or Written Date and Quantity: 04/19/21, #20 Last Office Visit and Type: 05/22/21, L breast pain Next Office Visit and Type: 07/04/21, CPE Last Controlled Substance Agreement Date: none Last UDS: none

## 2021-06-12 NOTE — Telephone Encounter (Signed)
ERx 

## 2021-06-12 NOTE — Telephone Encounter (Signed)
Scheduled appts per 8/9 sch msg. Pt aware.

## 2021-06-12 NOTE — Telephone Encounter (Signed)
Spoke to pt, provided navigation resources and contact information. Denies questions or concerns regarding dx or treatment care plan. Discussed next steps in breast MRI, port and chemo class. Received verbal understanding. No further needs or questions voiced.  Confirmed port placement with IR on 06/13/21 arrive at 10am.

## 2021-06-13 ENCOUNTER — Ambulatory Visit (HOSPITAL_COMMUNITY)
Admission: RE | Admit: 2021-06-13 | Discharge: 2021-06-13 | Disposition: A | Discharge status: Home / Self Care | Payer: BC Managed Care – PPO | Source: Ambulatory Visit | Attending: Hematology and Oncology | Admitting: Hematology and Oncology

## 2021-06-13 ENCOUNTER — Other Ambulatory Visit: Payer: Self-pay

## 2021-06-13 ENCOUNTER — Encounter: Payer: Self-pay | Admitting: *Deleted

## 2021-06-13 ENCOUNTER — Telehealth: Payer: Self-pay

## 2021-06-13 ENCOUNTER — Other Ambulatory Visit: Payer: Self-pay | Admitting: Hematology and Oncology

## 2021-06-13 ENCOUNTER — Encounter (HOSPITAL_COMMUNITY): Payer: Self-pay

## 2021-06-13 ENCOUNTER — Telehealth: Payer: Self-pay | Admitting: *Deleted

## 2021-06-13 DIAGNOSIS — Z87891 Personal history of nicotine dependence: Secondary | ICD-10-CM | POA: Diagnosis not present

## 2021-06-13 DIAGNOSIS — C50211 Malignant neoplasm of upper-inner quadrant of right female breast: Secondary | ICD-10-CM

## 2021-06-13 DIAGNOSIS — Z171 Estrogen receptor negative status [ER-]: Secondary | ICD-10-CM

## 2021-06-13 DIAGNOSIS — Z452 Encounter for adjustment and management of vascular access device: Secondary | ICD-10-CM | POA: Diagnosis not present

## 2021-06-13 DIAGNOSIS — Z888 Allergy status to other drugs, medicaments and biological substances status: Secondary | ICD-10-CM | POA: Diagnosis not present

## 2021-06-13 DIAGNOSIS — C50911 Malignant neoplasm of unspecified site of right female breast: Secondary | ICD-10-CM | POA: Diagnosis not present

## 2021-06-13 DIAGNOSIS — Z803 Family history of malignant neoplasm of breast: Secondary | ICD-10-CM | POA: Diagnosis not present

## 2021-06-13 DIAGNOSIS — Z91012 Allergy to eggs: Secondary | ICD-10-CM | POA: Diagnosis not present

## 2021-06-13 DIAGNOSIS — Z79899 Other long term (current) drug therapy: Secondary | ICD-10-CM | POA: Insufficient documentation

## 2021-06-13 DIAGNOSIS — Z885 Allergy status to narcotic agent status: Secondary | ICD-10-CM | POA: Diagnosis not present

## 2021-06-13 HISTORY — PX: IR IMAGING GUIDED PORT INSERTION: IMG5740

## 2021-06-13 MED ORDER — SODIUM CHLORIDE 0.9 % IV SOLN: INTRAVENOUS | Status: DC

## 2021-06-13 MED ORDER — FENTANYL CITRATE (PF) 100 MCG/2ML IJ SOLN
INTRAMUSCULAR | Status: AC
  Filled 2021-06-13: qty 2

## 2021-06-13 MED ORDER — LIDOCAINE HCL 1 % IJ SOLN
INTRAMUSCULAR | Status: AC
  Filled 2021-06-13: qty 20

## 2021-06-13 MED ORDER — HEPARIN SOD (PORK) LOCK FLUSH 100 UNIT/ML IV SOLN
INTRAVENOUS | Status: AC
  Filled 2021-06-13: qty 5

## 2021-06-13 MED ORDER — LIDOCAINE-EPINEPHRINE 1 %-1:100000 IJ SOLN
INTRAMUSCULAR | Status: AC | PRN
  Administered 2021-06-13: 20 mL

## 2021-06-13 MED ORDER — MIDAZOLAM HCL 2 MG/2ML IJ SOLN
INTRAMUSCULAR | Status: AC | PRN
  Administered 2021-06-13 (×2): 1 mg via INTRAVENOUS

## 2021-06-13 MED ORDER — FENTANYL CITRATE (PF) 100 MCG/2ML IJ SOLN
INTRAMUSCULAR | Status: AC | PRN
  Administered 2021-06-13: 50 ug via INTRAVENOUS

## 2021-06-13 MED ORDER — MIDAZOLAM HCL 2 MG/2ML IJ SOLN
INTRAMUSCULAR | Status: AC
  Filled 2021-06-13: qty 4

## 2021-06-13 NOTE — Telephone Encounter (Signed)
Questions and instructions regarding FMLA or disability form provided with both patient messages.   Currently no receipt of form by forms nursing staff.   Contact benefits manager to fax request to office.   Forms may be delivered to office 7:30 am - 4:30 pm, Mon - Fri along with completing office cover sheet with leave information to complete form, faxed [phone]), or e-mailed to [email or web address].   Complete and sign all employee sections.  Provide means of return to employer and self.  Allow up to fourteen calendar days for completion upon receipt of forms nurse.  No e-mailed attachments located for this patient in forms e-mail: [email or web address] on new patient visit 06/11/2021 or beyond.
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
## 2021-06-13 NOTE — Telephone Encounter (Signed)
RN returned call, voicemail left for call back.  Re: FMLA.

## 2021-06-13 NOTE — Telephone Encounter (Signed)
Voicemail per return call from [employer] [employer] [phone]). "When in to see Dr. Lindi Adie two days ago, he took the form and e-mailed it himself.  They also need paperwork I received today.  I can bring this in.  Let me know what you need."

## 2021-06-13 NOTE — Telephone Encounter (Signed)
""  Sabrina Wright [phone]).  I left paper work with Dr. Lindi Adie about a leave from work.  They're telling me they haven't received it.  I can't go without pay."

## 2021-06-13 NOTE — Discharge Instructions (Signed)
Urgent needs - Interventional Radiology on call MD [phone]  Wound - May remove dressing and shower in 24 to 48 hours.  Keep site clean and dry.  Replace with bandaid as needed.  Do not submerge in tub or water until site healing well. If closed with glue, glue will flake off on its own.  If ordered by your provider, may start Emla cream in 2 weeks or after incision is healed.  After completion of treatment, your provider should have you set up for monthly port flushes.   

## 2021-06-13 NOTE — Procedures (Signed)
Interventional Radiology Procedure Note  Procedure: Placement of a right IJ approach single lumen PowerPort.  Tip is positioned at the superior cavoatrial junction and catheter is ready for immediate use.  Complications: None Recommendations:  - Ok to shower tomorrow - Do not submerge for 7 days - Routine line care   Signed,  Jerzie Bieri S. Glennys Schorsch, DO   

## 2021-06-14 ENCOUNTER — Inpatient Hospital Stay: Payer: BC Managed Care – PPO

## 2021-06-14 ENCOUNTER — Inpatient Hospital Stay: Payer: BC Managed Care – PPO | Admitting: Emergency Medicine

## 2021-06-14 DIAGNOSIS — C50211 Malignant neoplasm of upper-inner quadrant of right female breast: Secondary | ICD-10-CM

## 2021-06-14 DIAGNOSIS — Z171 Estrogen receptor negative status [ER-]: Secondary | ICD-10-CM

## 2021-06-14 NOTE — Research (Signed)
DCP-001: Use of a Clinical Trial Screening Tool to Address Cancer Health Disparities in the [place] [facility])  Patient Sabrina Wright was identified by this Clinical Research Nurse as a potential candidate for the above listed study.  This Clinical Research Nurse met with MARCELLINA JONSSON, [id number], on 06/14/21 in a manner and location that ensures patient privacy to discuss participation in the above listed research study.  Patient is Accompanied by spouse [name] .  A copy of the informed consent document and separate HIPAA Authorization was provided to the patient.  Patient reads, speaks, and understands [place].   Patient was provided with the business card of this Nurse and encouraged to contact the research team with any questions.  Approximately 15 minutes were spent with the patient reviewing the informed consent documents.  Patient was provided the option of taking informed consent documents home to review and was encouraged to review at their convenience with their support network, including other care providers. Patient took the consent documents home to review.  Will f/u with patient regarding interest in about a week.  Wells Guiles '[place], RN, BSN Clinical Research Nurse I 06/14/21 10:34 AM

## 2021-06-15 ENCOUNTER — Other Ambulatory Visit: Payer: Self-pay

## 2021-06-15 ENCOUNTER — Ambulatory Visit
Admission: RE | Admit: 2021-06-15 | Discharge: 2021-06-15 | Disposition: A | Discharge status: Home / Self Care | Payer: BC Managed Care – PPO | Source: Ambulatory Visit | Attending: Hematology and Oncology | Admitting: Hematology and Oncology

## 2021-06-15 DIAGNOSIS — Z171 Estrogen receptor negative status [ER-]: Secondary | ICD-10-CM

## 2021-06-15 DIAGNOSIS — C50211 Malignant neoplasm of upper-inner quadrant of right female breast: Secondary | ICD-10-CM

## 2021-06-17 NOTE — Assessment & Plan Note (Signed)
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  Neoadjuvant chemotherapy with Taxotere and Cytoxan x4 2. breast conserving surgery with sentinel lymph node biopsy 3.  Adjuvant radiation therapy 4.  Consideration for adjuvant antiestrogen therapy with a 5% ER positivity. ----------------------------------------------------------------------------------------------------------------------- Current Treatment: Cycle 1 Taxotere Cytoxan.

## 2021-06-17 NOTE — Progress Notes (Signed)
Patient Care Team: Sabrina Bush, MD as PCP - General (Family Medicine) Sabrina Kaufmann, RN as Oncology Nurse Navigator Sabrina Germany, RN as Oncology Nurse Navigator  DIAGNOSIS:    ICD-10-CM   1. 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%, HER2 negative   Pain of left breast  Malignant neoplasm of upper-inner quadrant of right breast in female, estrogen receptor negative ([name])  06/05/2021 Initial Biopsy   History of left breast cancer status postlumpectomy 2003 Pain in the right breast: Mammogram and ultrasound revealed indeterminate masses in the right breast spanning 2.4 cm 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 with above-mentioned history of right breast cancer who is going to start fourth cycle of chemotherapy with Taxotere and Cytoxan tomorrow.  We could not give her Adriamycin because of prior chemotherapy containing Adriamycin.  She will be starting Taxotere and Cytoxan.  ALLERGIES:  is allergic to codeine, eggs or egg-derived products, and gadavist [gadobutrol].  MEDICATIONS:  Current  Outpatient Medications  Medication Sig Dispense Refill   acetaminophen (TYLENOL) 325 MG tablet Take 2 tablets (650 mg total) by mouth every 6 (six) hours as needed for fever, headache or moderate pain.     ALPRAZolam (XANAX) 0.5 MG tablet TAKE 0.5-1 TABLETS (0.25-0.5 MG TOTAL) BY MOUTH 2 (TWO) TIMES DAILY AS NEEDED FOR ANXIETY. 20 tablet 0   Calcium Carb-Cholecalciferol (CALCIUM 600/VITAMIN D3) 600-800 MG-UNIT TABS Take 2 tablets by mouth daily. 60 tablet    Cholecalciferol (VITAMIN D) 50 MCG (2000 UT) CAPS Take 1 capsule (2,000 Units total) by mouth daily. 30 capsule    citalopram (CELEXA) 20 MG tablet TAKE 1 TABLET BY MOUTH EVERY DAY 90 tablet 0   clobetasol cream (TEMOVATE) 1.95 % APPLY 1 APPLICATION TOPICALLY 2 (TWO) TIMES DAILY. APPLY TO AFFECTED AREA NO MORE THAN 2 WEEKS AT A TIME+ (Patient taking differently: Apply 1 application topically 2 (two) times daily as needed (rash). APPLY TO AFFECTED AREA NO MORE THAN 2 WEEKS AT A TIME) 30 g 0   denosumab (PROLIA) 60 MG/ML SOSY injection INJECT 60MG SUBCUTANEOUSLY  EVERY 6 MONTHS (GIVEN AT  PRESCRIBERS OFFICE) (Patient taking differently: Inject 60 mg into the skin every 6 (six) months. INJECT 60MG SUBCUTANEOUSLY  EVERY 6 MONTHS (GIVEN AT  PRESCRIBERS OFFICE)) 1 mL 1   dexamethasone (DECADRON) 4 MG tablet Take 1 tablet (4 mg total) by mouth daily. Start the day before Taxotere. Then again the day after chemo with food 12 tablet 0   lidocaine-prilocaine (EMLA) cream Apply to affected area  once 30 g 3   meloxicam (MOBIC) 7.5 MG tablet TAKE 1 TABLET BY MOUTH EVERY DAY 30 tablet 6   Multiple Vitamin (MULTIVITAMIN WITH MINERALS) TABS tablet Take 1 tablet by mouth at bedtime.     ondansetron (ZOFRAN) 8 MG tablet Take 1 tablet (8 mg total) by mouth 2 (two) times daily as needed for refractory nausea / vomiting. Start on day 3 after chemo. 30 tablet 1   prochlorperazine (COMPAZINE) 10 MG tablet Take 1 tablet (10 mg total) by mouth every 6 (six) hours as needed  (Nausea or vomiting). 30 tablet 1   SUMAtriptan (IMITREX) 100 MG tablet TAKE 1 TABLET BY MOUTH ONCE FOR 1 DOSE. MAY REPEAT IN 2 HOURS IF HEADACHE PERSISTS OR RECURS. 9 tablet 3   vitamin B-12 (V-R VITAMIN B-12) 500 MCG tablet Take 1 tablet (500 mcg total) by mouth daily.     No current facility-administered medications for this visit.    PHYSICAL EXAMINATION: ECOG PERFORMANCE STATUS: 1 - Symptomatic but completely ambulatory  Vitals:   06/18/21 1039  BP: (!) 159/61  Pulse: (!) 57  Resp: 18  Temp: 97.8 F (36.6 C)  SpO2: 100%   Filed Weights   06/18/21 1039  Weight: 170 lb 14.4 oz (77.5 kg)      LABORATORY DATA:  I have reviewed the data as listed CMP Latest Ref Rng & Units 01/03/2021 09/22/2020 09/21/2020  Glucose 70 - 99 mg/dL 100(H) 120(H) 126(H)  BUN 6 - 20 mg/dL '15 13 11  ' Creatinine 0.44 - 1.00 mg/dL 0.77 0.74 0.91  Sodium 135 - 145 mmol/L 138 138 136  Potassium 3.5 - 5.1 mmol/L 3.9 4.1 3.7  Chloride 98 - 111 mmol/L 104 103 100  CO2 22 - 32 mmol/L '27 27 26  ' Calcium 8.9 - 10.3 mg/dL 9.5 8.8(L) 9.0  Total Protein 6.5 - 8.1 g/dL 7.1 - -  Total Bilirubin 0.3 - 1.2 mg/dL 0.9 - -  Alkaline Phos 38 - 126 U/L 62 - -  AST 15 - 41 U/L 60(H) - -  ALT 0 - 44 U/L 57(H) - -    Lab Results  Component Value Date   WBC 7.4 06/18/2021   HGB 13.3 06/18/2021   HCT 39.8 06/18/2021   MCV 86.7 06/18/2021   PLT 289 06/18/2021   NEUTROABS 4.2 06/18/2021    ASSESSMENT & PLAN:  Malignant neoplasm of upper-inner quadrant of right breast in female, estrogen receptor negative ([name]) History of left breast cancer status postlumpectomy 2003 status post chemo with Adriamycin, radiation 06/05/2021: Pain in the right breast: Mammogram and ultrasound revealed indeterminate masses in the right breast spanning 2.4 cm biopsy revealed grade 3 IDC ER 5%, PR 0%, 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  Neoadjuvant chemotherapy with Taxotere and Cytoxan x4 2. breast conserving surgery with  sentinel lymph node biopsy 3.  Adjuvant radiation therapy 4.  Consideration for adjuvant antiestrogen therapy with a 5% ER positivity. ----------------------------------------------------------------------------------------------------------------------- Current Treatment: Cycle 1 Taxotere Cytoxan.  To start 06/19/2021 Reviewed chemotherapy regimen with toxicities as well as antiemetic regimen. Severe anxiety: I sent a prescription for Xanax.  I will see the patient 1 week after starting chemo for toxicity evaluation. No orders of the defined types were placed in this encounter.  The patient has a good understanding of the overall plan. she agrees with it. she will call with any problems that may develop before the next visit here.  Total time spent: 30 mins including face to face  time and time spent for planning, charting and coordination of care  Rulon Eisenmenger, MD, MPH 06/18/2021  I, Thana Ates, am acting as scribe for Dr. Nicholas Wright.  I have reviewed the above documentation for accuracy and completeness, and I agree with the above.

## 2021-06-18 ENCOUNTER — Other Ambulatory Visit: Payer: Self-pay

## 2021-06-18 ENCOUNTER — Encounter: Payer: Self-pay | Admitting: *Deleted

## 2021-06-18 ENCOUNTER — Inpatient Hospital Stay (HOSPITAL_BASED_OUTPATIENT_CLINIC_OR_DEPARTMENT_OTHER): Payer: BC Managed Care – PPO | Admitting: Hematology and Oncology

## 2021-06-18 ENCOUNTER — Inpatient Hospital Stay: Payer: BC Managed Care – PPO

## 2021-06-18 DIAGNOSIS — I447 Left bundle-branch block, unspecified: Secondary | ICD-10-CM | POA: Diagnosis not present

## 2021-06-18 DIAGNOSIS — Z87891 Personal history of nicotine dependence: Secondary | ICD-10-CM | POA: Diagnosis not present

## 2021-06-18 DIAGNOSIS — Z923 Personal history of irradiation: Secondary | ICD-10-CM | POA: Diagnosis not present

## 2021-06-18 DIAGNOSIS — R5383 Other fatigue: Secondary | ICD-10-CM | POA: Diagnosis not present

## 2021-06-18 DIAGNOSIS — M810 Age-related osteoporosis without current pathological fracture: Secondary | ICD-10-CM | POA: Diagnosis not present

## 2021-06-18 DIAGNOSIS — Z853 Personal history of malignant neoplasm of breast: Secondary | ICD-10-CM | POA: Diagnosis not present

## 2021-06-18 DIAGNOSIS — Z9221 Personal history of antineoplastic chemotherapy: Secondary | ICD-10-CM | POA: Diagnosis not present

## 2021-06-18 DIAGNOSIS — C50211 Malignant neoplasm of upper-inner quadrant of right female breast: Secondary | ICD-10-CM

## 2021-06-18 DIAGNOSIS — F419 Anxiety disorder, unspecified: Secondary | ICD-10-CM | POA: Diagnosis not present

## 2021-06-18 DIAGNOSIS — Z171 Estrogen receptor negative status [ER-]: Secondary | ICD-10-CM

## 2021-06-18 DIAGNOSIS — M898X0 Other specified disorders of bone, multiple sites: Secondary | ICD-10-CM | POA: Diagnosis not present

## 2021-06-18 DIAGNOSIS — Z803 Family history of malignant neoplasm of breast: Secondary | ICD-10-CM | POA: Diagnosis not present

## 2021-06-18 DIAGNOSIS — Z79899 Other long term (current) drug therapy: Secondary | ICD-10-CM | POA: Diagnosis not present

## 2021-06-18 DIAGNOSIS — Z5111 Encounter for antineoplastic chemotherapy: Secondary | ICD-10-CM | POA: Diagnosis not present

## 2021-06-18 DIAGNOSIS — Z5189 Encounter for other specified aftercare: Secondary | ICD-10-CM | POA: Diagnosis not present

## 2021-06-18 LAB — CBC WITH DIFFERENTIAL/PLATELET
Abs Immature Granulocytes: 0.02 10*3/uL (ref 0.00–0.07)
Basophils Absolute: 0.1 10*3/uL (ref 0.0–0.1)
Basophils Relative: 2 %
Eosinophils Absolute: 0.3 10*3/uL (ref 0.0–0.5)
Eosinophils Relative: 4 %
HCT: 39.8 % (ref 36.0–46.0)
Hemoglobin: 13.3 g/dL (ref 12.0–15.0)
Immature Granulocytes: 0 %
Lymphocytes Relative: 26 %
Lymphs Abs: 1.9 10*3/uL (ref 0.7–4.0)
MCH: 29 pg (ref 26.0–34.0)
MCHC: 33.4 g/dL (ref 30.0–36.0)
MCV: 86.7 fL (ref 80.0–100.0)
Monocytes Absolute: 0.8 10*3/uL (ref 0.1–1.0)
Monocytes Relative: 10 %
Neutro Abs: 4.2 10*3/uL (ref 1.7–7.7)
Neutrophils Relative %: 58 %
Platelets: 289 10*3/uL (ref 150–400)
RBC: 4.59 MIL/uL (ref 3.87–5.11)
RDW: 13.8 % (ref 11.5–15.5)
WBC: 7.4 10*3/uL (ref 4.0–10.5)
nRBC: 0 % (ref 0.0–0.2)

## 2021-06-18 LAB — COMPREHENSIVE METABOLIC PANEL
ALT: 23 U/L (ref 0–44)
AST: 23 U/L (ref 15–41)
Albumin: 3.8 g/dL (ref 3.5–5.0)
Alkaline Phosphatase: 60 U/L (ref 38–126)
Anion gap: 8 (ref 5–15)
BUN: 13 mg/dL (ref 6–20)
CO2: 27 mmol/L (ref 22–32)
Calcium: 9.2 mg/dL (ref 8.9–10.3)
Chloride: 105 mmol/L (ref 98–111)
Creatinine, Ser: 0.72 mg/dL (ref 0.44–1.00)
GFR, Estimated: >60 mL/min (ref >60)
Glucose, Bld: 81 mg/dL (ref 70–99)
Potassium: 4.1 mmol/L (ref 3.5–5.1)
Sodium: 140 mmol/L (ref 135–145)
Total Bilirubin: 0.5 mg/dL (ref 0.3–1.2)
Total Protein: 6.9 g/dL (ref 6.5–8.1)

## 2021-06-18 MED ORDER — ALPRAZOLAM 0.5 MG PO TABS: 0.2500 mg | ORAL_TABLET | Freq: Every day | ORAL | 1 refills | Status: DC

## 2021-06-19 ENCOUNTER — Inpatient Hospital Stay: Payer: BC Managed Care – PPO

## 2021-06-19 VITALS — BP 136/74 | HR 56 | Temp 98.8°F | Resp 16

## 2021-06-19 DIAGNOSIS — R5383 Other fatigue: Secondary | ICD-10-CM | POA: Diagnosis not present

## 2021-06-19 DIAGNOSIS — C50211 Malignant neoplasm of upper-inner quadrant of right female breast: Secondary | ICD-10-CM | POA: Diagnosis not present

## 2021-06-19 DIAGNOSIS — M898X0 Other specified disorders of bone, multiple sites: Secondary | ICD-10-CM | POA: Diagnosis not present

## 2021-06-19 DIAGNOSIS — F419 Anxiety disorder, unspecified: Secondary | ICD-10-CM | POA: Diagnosis not present

## 2021-06-19 DIAGNOSIS — Z923 Personal history of irradiation: Secondary | ICD-10-CM | POA: Diagnosis not present

## 2021-06-19 DIAGNOSIS — Z9221 Personal history of antineoplastic chemotherapy: Secondary | ICD-10-CM | POA: Diagnosis not present

## 2021-06-19 DIAGNOSIS — Z5189 Encounter for other specified aftercare: Secondary | ICD-10-CM | POA: Diagnosis not present

## 2021-06-19 DIAGNOSIS — M810 Age-related osteoporosis without current pathological fracture: Secondary | ICD-10-CM | POA: Diagnosis not present

## 2021-06-19 DIAGNOSIS — Z853 Personal history of malignant neoplasm of breast: Secondary | ICD-10-CM | POA: Diagnosis not present

## 2021-06-19 DIAGNOSIS — Z5111 Encounter for antineoplastic chemotherapy: Secondary | ICD-10-CM | POA: Diagnosis not present

## 2021-06-19 DIAGNOSIS — Z79899 Other long term (current) drug therapy: Secondary | ICD-10-CM | POA: Diagnosis not present

## 2021-06-19 DIAGNOSIS — I447 Left bundle-branch block, unspecified: Secondary | ICD-10-CM | POA: Diagnosis not present

## 2021-06-19 DIAGNOSIS — Z803 Family history of malignant neoplasm of breast: Secondary | ICD-10-CM | POA: Diagnosis not present

## 2021-06-19 DIAGNOSIS — Z171 Estrogen receptor negative status [ER-]: Secondary | ICD-10-CM | POA: Diagnosis not present

## 2021-06-19 DIAGNOSIS — Z87891 Personal history of nicotine dependence: Secondary | ICD-10-CM | POA: Diagnosis not present

## 2021-06-19 MED ORDER — SODIUM CHLORIDE 0.9% FLUSH
10.0000 mL | INTRAVENOUS | Status: DC | PRN
  Administered 2021-06-19: 10 mL

## 2021-06-19 MED ORDER — SODIUM CHLORIDE 0.9 % IV SOLN: Freq: Once | INTRAVENOUS | Status: AC

## 2021-06-19 MED ORDER — PALONOSETRON HCL INJECTION 0.25 MG/5ML
0.2500 mg | Freq: Once | INTRAVENOUS | Status: AC
  Administered 2021-06-19: 0.25 mg via INTRAVENOUS
  Filled 2021-06-19: qty 5

## 2021-06-19 MED ORDER — SODIUM CHLORIDE 0.9 % IV SOLN
150.0000 mg | Freq: Once | INTRAVENOUS | Status: AC
  Administered 2021-06-19: 150 mg via INTRAVENOUS
  Filled 2021-06-19: qty 150

## 2021-06-19 MED ORDER — SODIUM CHLORIDE 0.9 % IV SOLN
600.0000 mg/m2 | Freq: Once | INTRAVENOUS | Status: AC
  Administered 2021-06-19: 1120 mg via INTRAVENOUS
  Filled 2021-06-19: qty 56

## 2021-06-19 MED ORDER — SODIUM CHLORIDE 0.9 % IV SOLN
10.0000 mg | Freq: Once | INTRAVENOUS | Status: AC
  Administered 2021-06-19: 10 mg via INTRAVENOUS
  Filled 2021-06-19: qty 10

## 2021-06-19 MED ORDER — SODIUM CHLORIDE 0.9 % IV SOLN
60.0000 mg/m2 | Freq: Once | INTRAVENOUS | Status: AC
  Administered 2021-06-19: 110 mg via INTRAVENOUS
  Filled 2021-06-19: qty 11

## 2021-06-19 MED ORDER — HEPARIN SOD (PORK) LOCK FLUSH 100 UNIT/ML IV SOLN
500.0000 [IU] | Freq: Once | INTRAVENOUS | Status: AC | PRN
  Administered 2021-06-19: 500 [IU]

## 2021-06-19 NOTE — Progress Notes (Signed)
Location of Breast Cancer: upper-inner quadrant of right breast   Histology per Pathology Report:  06/05/2021   Receptor Status: ER 5%, PR 0%, Ki67 60%, HER2 negative  Did patient present with symptoms (if so, please note symptoms) or was this found on screening mammography?: History of left breast cancer status postlumpectomy 2003 Pain in the right breast: Mammogram and ultrasound revealed indeterminate masses in the right breast spanning 2.4 cm  Past/Anticipated interventions by surgeon, if any:  status postlumpectomy 2003,  06/05/2021 Margarette Canada M.D. ULTRASOUND GUIDED RIGHT BREAST CORE NEEDLE BIOPSY  Past/Anticipated interventions by medical oncology, if any: Dr Lindi Adie Chemotherapy  Patient is on Treatment Plan: BREAST TC Q21D  Lymphedema issues, if any:  no    Pain issues, if any:  yes, low back and legs constant and aching  SAFETY ISSUES: Prior radiation? yes, 2003 to left breast Pacemaker/ICD? no Possible current pregnancy?no, post menopausal Is the patient on methotrexate? no  Current Complaints / other details:  none    Vitals:   06/25/21 1228  BP: 124/65  Pulse: 73  Resp: 20  Temp: (!) 97.2 F (36.2 C)  SpO2: 99%  Weight: 169 lb 9.6 oz (76.9 kg)  Height: '5\' 4"'  (1.626 m)

## 2021-06-19 NOTE — Patient Instructions (Signed)
[facility]  Discharge Instructions: Thank you for choosing [facility] to provide your oncology and hematology care.   If you have a lab appointment with the [facility], please go directly to the [facility] and check in at the registration area.   Wear comfortable clothing and clothing appropriate for easy access to any Portacath or PICC line.   We strive to give you quality time with your provider. You may need to reschedule your appointment if you arrive late (15 or more minutes).  Arriving late affects you and other patients whose appointments are after yours.  Also, if you miss three or more appointments without notifying the office, you may be dismissed from the clinic at the provider's discretion.      For prescription refill requests, have your pharmacy contact our office and allow 72 hours for refills to be completed.    Today you received the following chemotherapy and/or immunotherapy agents Taxotere and Cytoxan   To help prevent nausea and vomiting after your treatment, we encourage you to take your nausea medication as directed.  BELOW ARE SYMPTOMS THAT SHOULD BE REPORTED IMMEDIATELY: *FEVER GREATER THAN 100.4 F (38 C) OR HIGHER *CHILLS OR SWEATING *NAUSEA AND VOMITING THAT IS NOT CONTROLLED WITH YOUR NAUSEA MEDICATION *UNUSUAL SHORTNESS OF BREATH *UNUSUAL BRUISING OR BLEEDING *URINARY PROBLEMS (pain or burning when urinating, or frequent urination) *BOWEL PROBLEMS (unusual diarrhea, constipation, pain near the anus) TENDERNESS IN MOUTH AND THROAT WITH OR WITHOUT PRESENCE OF ULCERS (sore throat, sores in mouth, or a toothache) UNUSUAL RASH, SWELLING OR PAIN  UNUSUAL VAGINAL DISCHARGE OR ITCHING   Items with * indicate a potential emergency and should be followed up as soon as possible or go to the Emergency Department if any problems should occur.  Please show the CHEMOTHERAPY ALERT CARD or IMMUNOTHERAPY ALERT CARD at  check-in to the Emergency Department and triage nurse.  Should you have questions after your visit or need to cancel or reschedule your appointment, please contact [facility]  Dept: [phone]  and follow the prompts.  Office hours are 8:00 a.m. to 4:30 p.m. Monday - Friday. Please note that voicemails left after 4:00 p.m. may not be returned until the following business day.  We are closed weekends and major holidays. You have access to a nurse at all times for urgent questions. Please call the main number to the clinic Dept: [phone] and follow the prompts.   For any non-urgent questions, you may also contact your provider using MyChart. We now offer e-Visits for anyone 83 and older to request care online for non-urgent symptoms. For details visit [email or web address].   Also download the MyChart app! Go to the app store, search "MyChart", open the app, select [facility], and log in with your MyChart username and password.  Due to Covid, a mask is required upon entering the hospital/clinic. If you do not have a mask, one will be given to you upon arrival. For doctor visits, patients may have 1 support person aged 20 or older with them. For treatment visits, patients cannot have anyone with them due to current Covid guidelines and our immunocompromised population.   Docetaxel injection What is this medication? DOCETAXEL (doe se TAX el) is a chemotherapy drug. It targets fast dividing cells, like cancer cells, and causes these cells to die. This medicine is used to treat many types of cancers like breast cancer, certain stomach cancers,head and neck cancer, lung cancer,  and prostate cancer. This medicine may be used for other purposes; ask your health care provider orpharmacist if you have questions. COMMON BRAND NAME(S): Docefrez, Taxotere What should I tell my care team before I take this medication? They need to know if you have any of these  conditions: infection (especially a virus infection such as chickenpox, cold sores, or herpes) liver disease low blood counts, like low white cell, platelet, or red cell counts an unusual or allergic reaction to docetaxel, polysorbate 80, other chemotherapy agents, other medicines, foods, dyes, or preservatives pregnant or trying to get pregnant breast-feeding How should I use this medication? This drug is given as an infusion into a vein. It is administered in a hospitalor clinic by a specially trained health care professional. Talk to your pediatrician regarding the use of this medicine in children.Special care may be needed. Overdosage: If you think you have taken too much of this medicine contact apoison control center or emergency room at once. NOTE: This medicine is only for you. Do not share this medicine with others. What if I miss a dose? It is important not to miss your dose. Call your doctor or health careprofessional if you are unable to keep an appointment. What may interact with this medication? Do not take this medicine with any of the following medications: live virus vaccines This medicine may also interact with the following medications: aprepitant certain antibiotics like erythromycin or clarithromycin certain antivirals for HIV or hepatitis certain medicines for fungal infections like fluconazole, itraconazole, ketoconazole, posaconazole, or voriconazole cimetidine ciprofloxacin conivaptan cyclosporine dronedarone fluvoxamine grapefruit juice imatinib verapamil This list may not describe all possible interactions. Give your health care provider a list of all the medicines, herbs, non-prescription drugs, or dietary supplements you use. Also tell them if you smoke, drink alcohol, or use illegaldrugs. Some items may interact with your medicine. What should I watch for while using this medication? Your condition will be monitored carefully while you are receiving this  medicine. You will need important blood work done while you are taking thismedicine. Call your doctor or health care professional for advice if you get a fever, chills or sore throat, or other symptoms of a cold or flu. Do not treat yourself. This drug decreases your body's ability to fight infections. Try toavoid being around people who are sick. Some products may contain alcohol. Ask your health care professional if this medicine contains alcohol. Be sure to tell all health care professionals you are taking this medicine. Certain medicines, like metronidazole and disulfiram, can cause an unpleasant reaction when taken with alcohol. The reaction includes flushing, headache, nausea, vomiting, sweating, and increased thirst. Thereaction can last from 30 minutes to several hours. You may get drowsy or dizzy. Do not drive, use machinery, or do anything that needs mental alertness until you know how this medicine affects you. Do not stand or sit up quickly, especially if you are an older patient. This reduces the risk of dizzy or fainting spells. Alcohol may interfere with the effect ofthis medicine. Talk to your health care professional about your risk of cancer. You may bemore at risk for certain types of cancer if you take this medicine. Do not become pregnant while taking this medicine or for 6 months after stopping it. Women should inform their doctor if they wish to become pregnant or think they might be pregnant. There is a potential for serious side effects to an unborn child. Talk to your health care professional or pharmacist for more  information. Do not breast-feed an infant while taking this medicine orfor 1 week after stopping it. Males who get this medicine must use a condom during sex with females who can get pregnant. If you get a woman pregnant, the baby could have birth defects. The baby could die before they are born. You will need to continue wearing a condom for 3 months after stopping the  medicine. Tell your health care providerright away if your partner becomes pregnant while you are taking this medicine. This may interfere with the ability to father a child. You should talk to yourdoctor or health care professional if you are concerned about your fertility. What side effects may I notice from receiving this medication? Side effects that you should report to your doctor or health care professionalas soon as possible: allergic reactions like skin rash, itching or hives, swelling of the face, lips, or tongue blurred vision breathing problems changes in vision low blood counts - This drug may decrease the number of white blood cells, red blood cells and platelets. You may be at increased risk for infections and bleeding. nausea and vomiting pain, redness or irritation at site where injected pain, tingling, numbness in the hands or feet redness, blistering, peeling, or loosening of the skin, including inside the mouth signs of decreased platelets or bleeding - bruising, pinpoint red spots on the skin, black, tarry stools, nosebleeds signs of decreased red blood cells - unusually weak or tired, fainting spells, lightheadedness signs of infection - fever or chills, cough, sore throat, pain or difficulty passing urine swelling of the ankle, feet, hands Side effects that usually do not require medical attention (report to yourdoctor or health care professional if they continue or are bothersome): constipation diarrhea fingernail or toenail changes hair loss loss of appetite mouth sores muscle pain This list may not describe all possible side effects. Call your doctor for medical advice about side effects. You may report side effects to FDA at1-800-FDA-1088. Where should I keep my medication? This drug is given in a hospital or clinic and will not be stored at home. NOTE: This sheet is a summary. It may not cover all possible information. If you have questions about this medicine,  talk to your doctor, pharmacist, orhealth care provider.  2022 Elsevier/Gold Standard (2019-09-20 19:50:31)  Cyclophosphamide Injection What is this medication? CYCLOPHOSPHAMIDE (sye kloe FOSS fa mide) is a chemotherapy drug. It slows the growth of cancer cells. This medicine is used to treat many types of cancer like lymphoma, myeloma, leukemia, breast cancer, and ovarian cancer, to name afew. This medicine may be used for other purposes; ask your health care provider orpharmacist if you have questions. COMMON BRAND NAME(S): Cytoxan, Neosar What should I tell my care team before I take this medication? They need to know if you have any of these conditions: heart disease history of irregular heartbeat infection kidney disease liver disease low blood counts, like white cells, platelets, or red blood cells on hemodialysis recent or ongoing radiation therapy scarring or thickening of the lungs trouble passing urine an unusual or allergic reaction to cyclophosphamide, other medicines, foods, dyes, or preservatives pregnant or trying to get pregnant breast-feeding How should I use this medication? This drug is usually given as an injection into a vein or muscle or by infusion into a vein. It is administered in a hospital or clinic by a specially trainedhealth care professional. Talk to your pediatrician regarding the use of this medicine in children.Special care may be needed. Overdosage: If  you think you have taken too much of this medicine contact apoison control center or emergency room at once. NOTE: This medicine is only for you. Do not share this medicine with others. What if I miss a dose? It is important not to miss your dose. Call your doctor or health careprofessional if you are unable to keep an appointment. What may interact with this medication? amphotericin B azathioprine certain antivirals for HIV or hepatitis certain medicines for blood pressure, heart disease, irregular  heart beat certain medicines that treat or prevent blood clots like warfarin certain other medicines for cancer cyclosporine etanercept indomethacin medicines that relax muscles for surgery medicines to increase blood counts metronidazole This list may not describe all possible interactions. Give your health care provider a list of all the medicines, herbs, non-prescription drugs, or dietary supplements you use. Also tell them if you smoke, drink alcohol, or use illegaldrugs. Some items may interact with your medicine. What should I watch for while using this medication? Your condition will be monitored carefully while you are receiving thismedicine. You may need blood work done while you are taking this medicine. Drink water or other fluids as directed. Urinate often, even at night. Some products may contain alcohol. Ask your health care professional if this medicine contains alcohol. Be sure to tell all health care professionals you are taking this medicine. Certain medicines, like metronidazole and disulfiram, can cause an unpleasant reaction when taken with alcohol. The reaction includes flushing, headache, nausea, vomiting, sweating, and increased thirst. Thereaction can last from 30 minutes to several hours. Do not become pregnant while taking this medicine or for 1 year after stopping it. Women should inform their health care professional if they wish to become pregnant or think they might be pregnant. Men should not father a child while taking this medicine and for 4 months after stopping it. There is potential for serious side effects to an unborn child. Talk to your health care professionalfor more information. Do not breast-feed an infant while taking this medicine or for 1 week afterstopping it. This medicine has caused ovarian failure in some women. This medicine may make it more difficult to get pregnant. Talk to your health care professional if [place] Sellers concerned about your  fertility. This medicine has caused decreased sperm counts in some men. This may make it more difficult to father a child. Talk to your health care professional if [place] S[place] concerned about your fertility. Call your health care professional for advice if you get a fever, chills, or sore throat, or other symptoms of a cold or flu. Do not treat yourself. This medicine decreases your body's ability to fight infections. Try to avoid beingaround people who are sick. Avoid taking medicines that contain aspirin, acetaminophen, ibuprofen, naproxen, or ketoprofen unless instructed by your health care professional.These medicines may hide a fever. Talk to your health care professional about your risk of cancer. You may bemore at risk for certain types of cancer if you take this medicine. If you are going to need surgery or other procedure, tell your health careprofessional that you are using this medicine. Be careful brushing or flossing your teeth or using a toothpick because you may get an infection or bleed more easily. If you have any dental work done, Primary school teacher you are receiving this medicine. What side effects may I notice from receiving this medication? Side effects that you should report to your doctor or health care professionalas soon as possible: allergic reactions like skin rash, itching or  hives, swelling of the face, lips, or tongue breathing problems nausea, vomiting signs and symptoms of bleeding such as bloody or black, tarry stools; red or dark brown urine; spitting up blood or brown material that looks like coffee grounds; red spots on the skin; unusual bruising or bleeding from the eyes, gums, or nose signs and symptoms of heart failure like fast, irregular heartbeat, sudden weight gain; swelling of the ankles, feet, hands signs and symptoms of infection like fever; chills; cough; sore throat; pain or trouble passing urine signs and symptoms of kidney injury like trouble passing urine or  change in the amount of urine signs and symptoms of liver injury like dark yellow or brown urine; general ill feeling or flu-like symptoms; light-colored stools; loss of appetite; nausea; right upper belly pain; unusually weak or tired; yellowing of the eyes or skin Side effects that usually do not require medical attention (report to yourdoctor or health care professional if they continue or are bothersome): confusion decreased hearing diarrhea facial flushing hair loss headache loss of appetite missed menstrual periods signs and symptoms of low red blood cells or anemia such as unusually weak or tired; feeling faint or lightheaded; falls skin discoloration This list may not describe all possible side effects. Call your doctor for medical advice about side effects. You may report side effects to FDA at1-800-FDA-1088. Where should I keep my medication? This drug is given in a hospital or clinic and will not be stored at home. NOTE: This sheet is a summary. It may not cover all possible information. If you have questions about this medicine, talk to your doctor, pharmacist, orhealth care provider.  2022 Elsevier/Gold Standard (2019-07-26 09:53:29)

## 2021-06-20 ENCOUNTER — Encounter: Payer: Self-pay | Admitting: Licensed Clinical Social Worker

## 2021-06-20 ENCOUNTER — Telehealth: Payer: Self-pay | Admitting: *Deleted

## 2021-06-20 ENCOUNTER — Encounter: Payer: BC Managed Care – PPO | Admitting: Emergency Medicine

## 2021-06-20 NOTE — Progress Notes (Signed)
[facility] Work  Clinical Social Work was referred by Therapist, sports for assessment of psychosocial needs.  Clinical Social Worker contacted patient by phone  to offer support and assess for needs.    Patient has financial stressors as her short-term disability pay has not begun since she has been receiving the incorrect forms.  Pt has worked for [employer] for 15 years but is currently not working due to treatment.  Lives with husband who receives SSI.  Stressed about transportation and food currently until STD benefits begin.  CSW reviewed potential assistance options including breast cancer foundations, [employer], and Building [employer]. Patient is open to and grateful for all assistance.  Plan: - [employer] application submitted today - CSW to see pt on 8/23 to sign up for [employer], provide bag of food from pantry, and provide applications for other breast cancer foundations.    [place], [place] Worker [employer]

## 2021-06-21 ENCOUNTER — Inpatient Hospital Stay: Payer: BC Managed Care – PPO

## 2021-06-21 ENCOUNTER — Other Ambulatory Visit: Payer: Self-pay

## 2021-06-21 VITALS — BP 125/79 | HR 59 | Temp 98.3°F | Resp 16

## 2021-06-21 DIAGNOSIS — Z171 Estrogen receptor negative status [ER-]: Secondary | ICD-10-CM | POA: Diagnosis not present

## 2021-06-21 DIAGNOSIS — R5383 Other fatigue: Secondary | ICD-10-CM | POA: Diagnosis not present

## 2021-06-21 DIAGNOSIS — I447 Left bundle-branch block, unspecified: Secondary | ICD-10-CM | POA: Diagnosis not present

## 2021-06-21 DIAGNOSIS — C50211 Malignant neoplasm of upper-inner quadrant of right female breast: Secondary | ICD-10-CM

## 2021-06-21 DIAGNOSIS — Z923 Personal history of irradiation: Secondary | ICD-10-CM | POA: Diagnosis not present

## 2021-06-21 DIAGNOSIS — M810 Age-related osteoporosis without current pathological fracture: Secondary | ICD-10-CM | POA: Diagnosis not present

## 2021-06-21 DIAGNOSIS — F419 Anxiety disorder, unspecified: Secondary | ICD-10-CM | POA: Diagnosis not present

## 2021-06-21 DIAGNOSIS — Z87891 Personal history of nicotine dependence: Secondary | ICD-10-CM | POA: Diagnosis not present

## 2021-06-21 DIAGNOSIS — M898X0 Other specified disorders of bone, multiple sites: Secondary | ICD-10-CM | POA: Diagnosis not present

## 2021-06-21 DIAGNOSIS — Z5111 Encounter for antineoplastic chemotherapy: Secondary | ICD-10-CM | POA: Diagnosis not present

## 2021-06-21 DIAGNOSIS — Z803 Family history of malignant neoplasm of breast: Secondary | ICD-10-CM | POA: Diagnosis not present

## 2021-06-21 DIAGNOSIS — Z9221 Personal history of antineoplastic chemotherapy: Secondary | ICD-10-CM | POA: Diagnosis not present

## 2021-06-21 DIAGNOSIS — Z5189 Encounter for other specified aftercare: Secondary | ICD-10-CM | POA: Diagnosis not present

## 2021-06-21 DIAGNOSIS — Z79899 Other long term (current) drug therapy: Secondary | ICD-10-CM | POA: Diagnosis not present

## 2021-06-21 DIAGNOSIS — Z853 Personal history of malignant neoplasm of breast: Secondary | ICD-10-CM | POA: Diagnosis not present

## 2021-06-21 MED ORDER — PEGFILGRASTIM-CBQV 6 MG/0.6ML ~~LOC~~ SOSY
PREFILLED_SYRINGE | SUBCUTANEOUS | Status: AC
  Filled 2021-06-21: qty 0.6

## 2021-06-21 MED ORDER — PEGFILGRASTIM-CBQV 6 MG/0.6ML ~~LOC~~ SOSY
6.0000 mg | PREFILLED_SYRINGE | Freq: Once | SUBCUTANEOUS | Status: AC
  Administered 2021-06-21: 6 mg via SUBCUTANEOUS

## 2021-06-21 NOTE — Patient Instructions (Signed)

## 2021-06-21 NOTE — Telephone Encounter (Signed)
06/21/2021 1555: Received [employer] Disability form, in que for completion. 06/21/2021: Connected with [employer] to request disability form.  06/20/2021 0930: Received [employer] Cancer claim policy holder/claimant information form.  Not provider information or signature request needed or required to complete.   8/16/222: Connected with [employer].  Requested Disability form.  Marjory Lies reports faxed earlier today to [phone].  Not received or located per [employer].  Provided [phone].  Marjory Lies received long awaited confirmation to [phone].  This nurse awaiting receipt.   Voicemail 06/19/2021 1235 pm: "[employer] Disability [phone]) calling for LORRINA BHAVSAR , 1960/11/15 to get:  Patient stage and type of cancer. Date Dr. Is certifying patient out of work. Is she currently getting chemotherapy and/or radiation treatments? If so, when are they scheduled to end and how often is she receiving the treatment? Does the doctor have her out of work while receiving treatment? Is she experiencing any side effects at this time? When is her prognosis date for an expected return to work day? Please return call with this information using claim number [id number]."  06/19/2021 at 1518: Spoke with [name] upon return of completed FMLA form and Critical Illness claim.  [employer] Critical Illness form requesting myocardial infarction information.  Critical Illness claim currently needs employer, employee, claimant information to be completed.  Return information for patient to use to return provided within form.  Advised ensuring she received all pages and correct form as no oncology questions received.  "Claim information I have to complete request checking or savings account information."

## 2021-06-21 NOTE — Progress Notes (Signed)
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 BIOPSY COMPARISON:  Previous exam(s). FINDINGS: 3D Mammographic images were obtained following ultrasound guided biopsy of found the 2 adjacent 1 cm masses in the UPPER-OUTER RIGHT breast at the 11 o'clock position. The RIBBON biopsy marking clip is in expected position at the site of biopsy between the 2 adjacent biopsied masses. IMPRESSION: Appropriate positioning of the RIBBON shaped biopsy marking clip at the site of biopsy in the UPPER OUTER RIGHT breast, between the 2 biopsied masses. Final Assessment: Post Procedure Mammograms for Marker Placement Electronically Signed   By: Margarette Canada M.D.   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 This was found to be concordant by Dr. Hassan Rowan. Pathology results were discussed with the patient by telephone. The patient reported doing well after the biopsy with tenderness at  the site. Post biopsy instructions and care were reviewed and questions were answered. The patient was encouraged to call [facility] for any additional concerns. Surgical consultation has been arranged with Dr. Autumn Messing at [facility] on June 07, 2021. Pathology results reported by Stacie Acres RN on 06/07/2021. Electronically Signed   By: Margarette Canada M.D.   On: 06/07/2021 08:40   Result Date: 06/07/2021 CLINICAL DATA:  60 year old female for tissue sampling of 2 adjacent 1 cm UPPER OUTER RIGHT breast masses. EXAM: ULTRASOUND GUIDED RIGHT BREAST CORE NEEDLE BIOPSY COMPARISON:  Previous exam(s). FINDINGS: I met with  the patient and we discussed the procedure of ultrasound-guided biopsy, including benefits and alternatives. We discussed the high likelihood of a successful procedure. We discussed the risks of the procedure, including infection, bleeding, tissue injury, clip migration, and inadequate sampling. Informed written consent was given. The usual time-out protocol was performed immediately prior to the procedure. Using sterile technique and 1% Lidocaine as local anesthetic, under direct ultrasound visualization, a 12 gauge spring-loaded device was used to perform biopsies of the 2 adjacent 1 cm UPPER-OUTER RIGHT breast masses at the 11 o'clock position using a LATERAL approach. Specimens were placed in 1 container due to close proximity. At the conclusion of the procedure a RIBBON tissue marker clip was deployed into the biopsy cavity. Follow up 2 view mammogram was performed and dictated separately. IMPRESSION: Ultrasound guided biopsy of 2 adjacent UPPER OUTER RIGHT breast masses. No apparent complications. Electronically Signed: By: Margarette Canada M.D. On: 06/05/2021 12:00  IR IMAGING GUIDED PORT INSERTION  Result Date: 06/13/2021 INDICATION: 60 year old female referred for port catheter EXAM: IMAGE GUIDED PLACEMENT OF RIGHT IJ PORT CATHETER MEDICATIONS: None  ANESTHESIA/SEDATION: Moderate (conscious) sedation was employed during this procedure. A total of Versed 3.0 mg and Fentanyl 50 mcg was administered intravenously. Moderate Sedation Time: 19 minutes. The patient's level of consciousness and vital signs were monitored continuously by radiology nursing throughout the procedure under my direct supervision. FLUOROSCOPY TIME:  Fluoroscopy Time: 0 minutes 6 seconds (1 mGy). COMPLICATIONS: None PROCEDURE: The procedure, risks, benefits, and alternatives were explained to the patient. Questions regarding the procedure were encouraged and answered. The patient understands and consents to the procedure. Ultrasound survey was performed with images stored and sent to PACs. The right neck and chest was prepped with chlorhexidine, and draped in the usual sterile fashion using maximum barrier technique (cap and mask, sterile gown, sterile gloves, large sterile sheet, hand hygiene and cutaneous antiseptic). Local anesthesia was attained by infiltration with 1% lidocaine without epinephrine. Ultrasound demonstrated patency of the right internal jugular vein, and this was documented with an image. Under real-time ultrasound guidance, this vein was accessed with a 21 gauge micropuncture needle and image documentation was performed. A small dermatotomy was made at the access site with an 11 scalpel. A 0.018" wire was advanced into the SVC and used to estimate the length of the internal catheter. The access needle exchanged for a 63F micropuncture vascular sheath. The 0.018" wire was then removed and a 0.035" wire advanced into the IVC. An appropriate location for the subcutaneous reservoir was selected below the clavicle and an incision was made through the existing scar, and underlying soft tissues. The subcutaneous tissues were then dissected using a combination of blunt and sharp surgical technique and a pocket was formed. A single lumen power injectable portacatheter was then  tunneled through the subcutaneous tissues from the pocket to the dermatotomy and the port reservoir placed within the subcutaneous pocket. The venous access site was then serially dilated and a peel away vascular sheath placed over the wire. The wire was removed and the port catheter advanced into position under fluoroscopic guidance. The catheter tip is positioned in the cavoatrial junction. This was documented with a spot image. The portacatheter was then tested and found to flush and aspirate well. The port was flushed with saline followed by 100 units/mL heparinized saline. The pocket was then closed in two layers using first subdermal inverted interrupted absorbable sutures followed by a running subcuticular suture. The epidermis was then sealed with Dermabond. The  dermatotomy at the venous access site was also seal with Dermabond. Patient tolerated the procedure well and remained hemodynamically stable throughout. No complications encountered and no significant blood loss encountered IMPRESSION: Status post right IJ port catheter placement. Signed, Dulcy Fanny. Dellia Nims, RPVI [facility] Electronically Signed   By: Corrie Mckusick D.O.   On: 06/13/2021 10:02      IMPRESSION: Stage IIB (cT2, cN0, cM0) Right Breast UOQ, Invasive Ductal Carcinoma, ER+ / PR- / Her2-, Grade 3  Patient will be a good candidate for breast conservation therapy with radiation therapy directed at the right breast.  Radiation treatment Is familiar to the patient in light of her previous left breast radiation treatments.  She overall tolerated this treatment well with minimal fibrosis and skin issues.  She however does have some chronic pain in the left breast, etiology unknown.  Patient will proceed with neoadjuvant chemotherapy given her essentially triple negative breast cancer.  She will then proceed with definitive surgery and be referred back to radiation oncology for  adjuvant radiation treatments.  Today, I talked to the patient and husband about the findings and work-up thus far.  We discussed the natural history of invasive breast cancer and general treatment, highlighting the role of radiotherapy in the management.  We discussed the available radiation techniques, and focused on the details of logistics and delivery.  We reviewed the anticipated acute and late sequelae associated with radiation in this setting.  The patient was encouraged to ask questions that I answered to the best of my ability.  A patient consent form was discussed and signed.  We retained a copy for our records.  The patient would like to proceed with radiation and will be scheduled for CT simulation.  PLAN: The patient will be seen after her surgery for planning of her radiation treatments.  Given her previous left breast radiation therapy and potential overlap over the sternum area, I would not recommend hypofractionated accelerated radiation therapy.  She will receive conventional radiation therapy over approximately 6 and half weeks of treatment.   60 minutes of total time was spent for this patient encounter, including preparation, face-to-face counseling with the patient and coordination of care, physical exam, and documentation of the encounter.   ------------------------------------------------  Blair Promise, PhD, MD  This document serves as a record of services personally performed by Gery Pray, MD. It was created on his behalf by Roney Mans, a trained medical scribe. The creation of this record is based on the scribe's personal observations and the provider's statements to them. This document has been checked and approved by the attending provider.

## 2021-06-22 ENCOUNTER — Other Ambulatory Visit: Payer: Self-pay | Admitting: Hematology & Oncology

## 2021-06-22 MED ORDER — TRAMADOL HCL 50 MG PO TABS: 50.0000 mg | ORAL_TABLET | Freq: Four times a day (QID) | ORAL | 0 refills | Status: DC | PRN

## 2021-06-24 ENCOUNTER — Other Ambulatory Visit: Payer: Self-pay | Admitting: Family Medicine

## 2021-06-24 DIAGNOSIS — E538 Deficiency of other specified B group vitamins: Secondary | ICD-10-CM

## 2021-06-24 DIAGNOSIS — E559 Vitamin D deficiency, unspecified: Secondary | ICD-10-CM

## 2021-06-25 ENCOUNTER — Other Ambulatory Visit: Payer: Self-pay

## 2021-06-25 ENCOUNTER — Ambulatory Visit
Admission: RE | Admit: 2021-06-25 | Discharge: 2021-06-25 | Disposition: A | Discharge status: Home / Self Care | Payer: BC Managed Care – PPO | Source: Ambulatory Visit | Attending: Radiation Oncology | Admitting: Radiation Oncology

## 2021-06-25 ENCOUNTER — Telehealth: Payer: Self-pay | Admitting: Emergency Medicine

## 2021-06-25 ENCOUNTER — Encounter: Payer: Self-pay | Admitting: Radiation Oncology

## 2021-06-25 ENCOUNTER — Encounter: Payer: Self-pay | Admitting: Hematology and Oncology

## 2021-06-25 ENCOUNTER — Encounter: Payer: Self-pay | Admitting: Family Medicine

## 2021-06-25 VITALS — BP 124/65 | HR 73 | Temp 97.2°F | Resp 20 | Ht 64.0 in | Wt 169.6 lb

## 2021-06-25 DIAGNOSIS — Z171 Estrogen receptor negative status [ER-]: Secondary | ICD-10-CM | POA: Insufficient documentation

## 2021-06-25 DIAGNOSIS — E119 Type 2 diabetes mellitus without complications: Secondary | ICD-10-CM | POA: Insufficient documentation

## 2021-06-25 DIAGNOSIS — Z803 Family history of malignant neoplasm of breast: Secondary | ICD-10-CM | POA: Diagnosis not present

## 2021-06-25 DIAGNOSIS — C50211 Malignant neoplasm of upper-inner quadrant of right female breast: Secondary | ICD-10-CM | POA: Insufficient documentation

## 2021-06-25 DIAGNOSIS — I447 Left bundle-branch block, unspecified: Secondary | ICD-10-CM | POA: Insufficient documentation

## 2021-06-25 DIAGNOSIS — Z79899 Other long term (current) drug therapy: Secondary | ICD-10-CM | POA: Diagnosis not present

## 2021-06-25 DIAGNOSIS — M810 Age-related osteoporosis without current pathological fracture: Secondary | ICD-10-CM | POA: Insufficient documentation

## 2021-06-25 DIAGNOSIS — Z923 Personal history of irradiation: Secondary | ICD-10-CM | POA: Diagnosis not present

## 2021-06-25 DIAGNOSIS — Z9221 Personal history of antineoplastic chemotherapy: Secondary | ICD-10-CM | POA: Insufficient documentation

## 2021-06-25 DIAGNOSIS — F17210 Nicotine dependence, cigarettes, uncomplicated: Secondary | ICD-10-CM | POA: Insufficient documentation

## 2021-06-25 DIAGNOSIS — Z791 Long term (current) use of non-steroidal anti-inflammatories (NSAID): Secondary | ICD-10-CM | POA: Diagnosis not present

## 2021-06-25 NOTE — Progress Notes (Signed)
See MD note for nursing evaluation. °

## 2021-06-25 NOTE — Telephone Encounter (Signed)
DCP-001: Use of a Clinical Trial Screening Tool to Address Cancer Health Disparities in the [facility])  Called pt to f/u on potential interest in DCP-001 study.  Pt states she is interested, will meet with her tomorrow (06/26/21) during/after her appt with MD Lindi Adie and SW to discuss study and consent.  Pt denies any further questions/concerns at this time, aware to f/u as needed before then.  Wells Guiles 'Robbins, RN, BSN Clinical Research Nurse I 06/25/21 9:22 AM

## 2021-06-25 NOTE — Progress Notes (Signed)
Called pt to introduce myself as her Arboriculturist, discuss copay assistance for [employer] and the [employer].  I left a msg requesting she return my call if she's interested in applying for the grants.

## 2021-06-25 NOTE — Assessment & Plan Note (Signed)
History of left breast cancer status postlumpectomy 2003status post chemo with Adriamycin, radiation 06/05/2021:Pain in the right breast: Mammogram and ultrasound revealed indeterminate masses in the right breast spanning 2.4 cm biopsy revealed grade 3 IDC ER 5%, PR 0%, Ki67 60%, HER2 negative  Treatment plan: 1.Neoadjuvant chemotherapy with Taxotereand Cytoxanx4 2.breast conserving surgery with sentinel lymph node biopsy 3.Adjuvant radiation therapy 4.Consideration for adjuvant antiestrogen therapy with a 5% ER positivity. ----------------------------------------------------------------------------------------------------------------------- Current Treatment: Cycle 1 day 8 Taxotere Cytoxan.(started 06/19/2021) Chemo Toxicities:  Severe anxiety: I sent a prescription for Xanax. RTC in 2 weeks for cycle 2

## 2021-06-25 NOTE — Progress Notes (Signed)
Patient Care Team: [name], MD as PCP - General (Family Medicine) Mauro Kaufmann, RN as Oncology Nurse Navigator Rockwell Germany, RN as Oncology Nurse Navigator  DIAGNOSIS:    ICD-10-CM   1. Malignant neoplasm of upper-inner quadrant of right breast in female, estrogen receptor negative ([place])  C50.211    Z17.1       SUMMARY OF ONCOLOGIC HISTORY: Oncology History  History of left breast cancer  06/05/2021 Initial Biopsy   History of left breast cancer status postlumpectomy 2003 Pain in the right breast: Mammogram and ultrasound revealed indeterminate masses in the right breast spanning 2.4 cm biopsy revealed grade 3 IDC ER 5%, PR 0%, Ki67 60%, HER2 negative   Pain of left breast  Malignant neoplasm of upper-inner quadrant of right breast in female, estrogen receptor negative ([place])  06/05/2021 Initial Biopsy   History of left breast cancer status postlumpectomy 2003 Pain in the right breast: Mammogram and ultrasound revealed indeterminate masses in the right breast spanning 2.4 cm biopsy revealed grade 3 IDC ER 5%, PR 0%, Ki67 60%, HER2 negative   06/11/2021 Cancer Staging   Staging form: Breast, AJCC 8th Edition - Clinical stage from 06/11/2021: Stage IIB (cT2, cN0, cM0, G3, ER-, PR-, HER2-) - Signed by Nicholas Lose, MD on 06/11/2021 Stage prefix: Initial diagnosis Histologic grading system: 3 grade system   06/19/2021 -  Chemotherapy    Patient is on Treatment Plan: BREAST TC Q21D         CHIEF COMPLIANT: Cycle 1 day 8 Taxotere and Cytoxan  INTERVAL HISTORY: Sabrina Wright is a 60 y.o. with above-mentioned history of right breast cancer currently on chemotherapy with Taxotere and Cytoxan. She presents to the clinic today for follow-up and toxicity evaluation.  Her major complaint today is diffuse bone pain that started after the Neulasta injection continued until today.  She took Ultram which gave her only temporary relief.  ALLERGIES:  is allergic to codeine, eggs or  egg-derived products, and gadavist [gadobutrol].  MEDICATIONS:  Current Outpatient Medications  Medication Sig Dispense Refill   acetaminophen (TYLENOL) 325 MG tablet Take 2 tablets (650 mg total) by mouth every 6 (six) hours as needed for fever, headache or moderate pain.     ALPRAZolam (XANAX) 0.5 MG tablet Take 0.5-1 tablets (0.25-0.5 mg total) by mouth at bedtime. 30 tablet 1   Calcium Carb-Cholecalciferol (CALCIUM 600/VITAMIN D3) 600-800 MG-UNIT TABS Take 2 tablets by mouth daily. 60 tablet    Cholecalciferol (VITAMIN D) 50 MCG (2000 UT) CAPS Take 1 capsule (2,000 Units total) by mouth daily. 30 capsule    citalopram (CELEXA) 20 MG tablet TAKE 1 TABLET BY MOUTH EVERY DAY 90 tablet 0   clobetasol cream (TEMOVATE) 4.31 % APPLY 1 APPLICATION TOPICALLY 2 (TWO) TIMES DAILY. APPLY TO AFFECTED AREA NO MORE THAN 2 WEEKS AT A TIME+ (Patient taking differently: Apply 1 application topically 2 (two) times daily as needed (rash). APPLY TO AFFECTED AREA NO MORE THAN 2 WEEKS AT A TIME) 30 g 0   denosumab (PROLIA) 60 MG/ML SOSY injection INJECT 60MG SUBCUTANEOUSLY  EVERY 6 MONTHS (GIVEN AT  PRESCRIBERS OFFICE) (Patient taking differently: Inject 60 mg into the skin every 6 (six) months. INJECT 60MG SUBCUTANEOUSLY  EVERY 6 MONTHS (GIVEN AT  PRESCRIBERS OFFICE)) 1 mL 1   dexamethasone (DECADRON) 4 MG tablet Take 1 tablet (4 mg total) by mouth daily. Start the day before Taxotere. Then again the day after chemo with food 12 tablet 0  lidocaine-prilocaine (EMLA) cream Apply to affected area once (Patient not taking: Reported on 06/25/2021) 30 g 3   loratadine (CLARITIN) 10 MG tablet Take 10 mg by mouth as needed for allergies.     meloxicam (MOBIC) 7.5 MG tablet TAKE 1 TABLET BY MOUTH EVERY DAY 30 tablet 6   Multiple Vitamin (MULTIVITAMIN WITH MINERALS) TABS tablet Take 1 tablet by mouth at bedtime.     ondansetron (ZOFRAN) 8 MG tablet Take 1 tablet (8 mg total) by mouth 2 (two) times daily as needed for  refractory nausea / vomiting. Start on day 3 after chemo. (Patient not taking: Reported on 06/25/2021) 30 tablet 1   prochlorperazine (COMPAZINE) 10 MG tablet Take 1 tablet (10 mg total) by mouth every 6 (six) hours as needed (Nausea or vomiting). (Patient not taking: Reported on 06/25/2021) 30 tablet 1   SUMAtriptan (IMITREX) 100 MG tablet TAKE 1 TABLET BY MOUTH ONCE FOR 1 DOSE. MAY REPEAT IN 2 HOURS IF HEADACHE PERSISTS OR RECURS. 9 tablet 3   traMADol (ULTRAM) 50 MG tablet Take 1 tablet (50 mg total) by mouth every 6 (six) hours as needed. 30 tablet 0   vitamin B-12 (V-R VITAMIN B-12) 500 MCG tablet Take 1 tablet (500 mcg total) by mouth daily.     No current facility-administered medications for this visit.    PHYSICAL EXAMINATION: ECOG PERFORMANCE STATUS: 1 - Symptomatic but completely ambulatory  Vitals:   06/26/21 0939  BP: (!) 106/55  Pulse: 84  Resp: 18  Temp: 97.9 F (36.6 C)  SpO2: 95%   Filed Weights   06/26/21 0939  Weight: 170 lb 11.2 oz (77.4 kg)    BREAST: No palpable masses or nodules in either right or left breasts. No palpable axillary supraclavicular or infraclavicular adenopathy no breast tenderness or nipple discharge. (exam performed in the presence of a chaperone)  LABORATORY DATA:  I have reviewed the data as listed CMP Latest Ref Rng & Units 06/18/2021 01/03/2021 09/22/2020  Glucose 70 - 99 mg/dL 81 100(H) 120(H)  BUN 6 - 20 mg/dL _0 Creatinine 0.44 - 1.00 mg/dL 0.72 0.77 0.74  Sodium 135 - 145 mmol/L 140 138 138  Potassium 3.5 - 5.1 mmol/L 4.1 3.9 4.1  Chloride 98 - 111 mmol/L 105 104 103  CO2 22 - 32 mmol/L _1 Calcium 8.9 - 10.3 mg/dL 9.2 9.5 8.8(L)  Total Protein 6.5 - 8.1 g/dL 6.9 7.1 -  Total Bilirubin 0.3 - 1.2 mg/dL 0.5 0.9 -  Alkaline Phos 38 - 126 U/L 60 62 -  AST 15 - 41 U/L 23 60(H) -  ALT 0 - 44 U/L 23 57(H) -    Lab Results  Component Value Date   WBC 6.7 06/26/2021   HGB 12.4 06/26/2021   HCT 37.1 06/26/2021   MCV  86.1 06/26/2021   PLT 183 06/26/2021   NEUTROABS PENDING 06/26/2021    ASSESSMENT & PLAN:  Malignant neoplasm of upper-inner quadrant of right breast in female, estrogen receptor negative ([place]) History of left breast cancer status postlumpectomy 2003 status post chemo with Adriamycin, radiation 06/05/2021: Pain in the right breast: Mammogram and ultrasound revealed indeterminate masses in the right breast spanning 2.4 cm biopsy revealed grade 3 IDC ER 5%, PR 0%, 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  Neoadjuvant chemotherapy with Taxotere and Cytoxan x4 2. breast conserving surgery with sentinel lymph node biopsy 3.  Adjuvant radiation therapy 4.  Consideration for adjuvant antiestrogen therapy  with a 5% ER positivity. ----------------------------------------------------------------------------------------------------------------------- Current Treatment: Cycle 1 day 8 Taxotere Cytoxan.(started 06/19/2021) Chemo Toxicities: Severe bone pain from Neulasta: We discussed different options and decided to discontinue it for the next treatment onwards. Fatigue  Denies any nausea or vomiting. Severe anxiety: on Xanax. RTC in 2 weeks for cycle 2    No orders of the defined types were placed in this encounter.  The patient has a good understanding of the overall plan. she agrees with it. she will call with any problems that may develop before the next visit here.  Total time spent: 30 mins including face to face time and time spent for planning, charting and coordination of care  Rulon Eisenmenger, MD, MPH 06/26/2021  I, Thana Ates, am acting as scribe for Dr. Nicholas Lose.  I have reviewed the above documentation for accuracy and completeness, and I agree with the above.

## 2021-06-26 ENCOUNTER — Inpatient Hospital Stay: Payer: BC Managed Care – PPO | Admitting: Emergency Medicine

## 2021-06-26 ENCOUNTER — Inpatient Hospital Stay: Payer: BC Managed Care – PPO | Admitting: Licensed Clinical Social Worker

## 2021-06-26 ENCOUNTER — Inpatient Hospital Stay: Payer: BC Managed Care – PPO

## 2021-06-26 ENCOUNTER — Inpatient Hospital Stay (HOSPITAL_BASED_OUTPATIENT_CLINIC_OR_DEPARTMENT_OTHER): Payer: BC Managed Care – PPO | Admitting: Hematology and Oncology

## 2021-06-26 ENCOUNTER — Encounter: Payer: Self-pay | Admitting: *Deleted

## 2021-06-26 DIAGNOSIS — Z803 Family history of malignant neoplasm of breast: Secondary | ICD-10-CM | POA: Diagnosis not present

## 2021-06-26 DIAGNOSIS — C50211 Malignant neoplasm of upper-inner quadrant of right female breast: Secondary | ICD-10-CM

## 2021-06-26 DIAGNOSIS — Z5189 Encounter for other specified aftercare: Secondary | ICD-10-CM | POA: Diagnosis not present

## 2021-06-26 DIAGNOSIS — Z923 Personal history of irradiation: Secondary | ICD-10-CM | POA: Diagnosis not present

## 2021-06-26 DIAGNOSIS — Z171 Estrogen receptor negative status [ER-]: Secondary | ICD-10-CM

## 2021-06-26 DIAGNOSIS — M810 Age-related osteoporosis without current pathological fracture: Secondary | ICD-10-CM | POA: Diagnosis not present

## 2021-06-26 DIAGNOSIS — F419 Anxiety disorder, unspecified: Secondary | ICD-10-CM | POA: Diagnosis not present

## 2021-06-26 DIAGNOSIS — Z79899 Other long term (current) drug therapy: Secondary | ICD-10-CM | POA: Diagnosis not present

## 2021-06-26 DIAGNOSIS — M898X0 Other specified disorders of bone, multiple sites: Secondary | ICD-10-CM | POA: Diagnosis not present

## 2021-06-26 DIAGNOSIS — R5383 Other fatigue: Secondary | ICD-10-CM | POA: Diagnosis not present

## 2021-06-26 DIAGNOSIS — Z95828 Presence of other vascular implants and grafts: Secondary | ICD-10-CM

## 2021-06-26 DIAGNOSIS — Z87891 Personal history of nicotine dependence: Secondary | ICD-10-CM | POA: Diagnosis not present

## 2021-06-26 DIAGNOSIS — I447 Left bundle-branch block, unspecified: Secondary | ICD-10-CM | POA: Diagnosis not present

## 2021-06-26 DIAGNOSIS — Z9221 Personal history of antineoplastic chemotherapy: Secondary | ICD-10-CM | POA: Diagnosis not present

## 2021-06-26 DIAGNOSIS — Z853 Personal history of malignant neoplasm of breast: Secondary | ICD-10-CM | POA: Diagnosis not present

## 2021-06-26 DIAGNOSIS — Z5111 Encounter for antineoplastic chemotherapy: Secondary | ICD-10-CM | POA: Diagnosis not present

## 2021-06-26 LAB — CBC WITH DIFFERENTIAL/PLATELET
Abs Immature Granulocytes: 0.98 10*3/uL — ABNORMAL HIGH (ref 0.00–0.07)
Basophils Absolute: 0.1 10*3/uL (ref 0.0–0.1)
Basophils Relative: 2 %
Eosinophils Absolute: 0.1 10*3/uL (ref 0.0–0.5)
Eosinophils Relative: 2 %
HCT: 37.1 % (ref 36.0–46.0)
Hemoglobin: 12.4 g/dL (ref 12.0–15.0)
Immature Granulocytes: 15 %
Lymphocytes Relative: 16 %
Lymphs Abs: 1.1 10*3/uL (ref 0.7–4.0)
MCH: 28.8 pg (ref 26.0–34.0)
MCHC: 33.4 g/dL (ref 30.0–36.0)
MCV: 86.1 fL (ref 80.0–100.0)
Monocytes Absolute: 1.4 10*3/uL — ABNORMAL HIGH (ref 0.1–1.0)
Monocytes Relative: 21 %
Neutro Abs: 3 10*3/uL (ref 1.7–7.7)
Neutrophils Relative %: 44 %
Platelets: 183 10*3/uL (ref 150–400)
RBC: 4.31 MIL/uL (ref 3.87–5.11)
RDW: 13.4 % (ref 11.5–15.5)
WBC: 6.7 10*3/uL (ref 4.0–10.5)
nRBC: 0.4 % — ABNORMAL HIGH (ref 0.0–0.2)

## 2021-06-26 LAB — COMPREHENSIVE METABOLIC PANEL
ALT: 19 U/L (ref 0–44)
AST: 25 U/L (ref 15–41)
Albumin: 3.5 g/dL (ref 3.5–5.0)
Alkaline Phosphatase: 67 U/L (ref 38–126)
Anion gap: 12 (ref 5–15)
BUN: 13 mg/dL (ref 6–20)
CO2: 25 mmol/L (ref 22–32)
Calcium: 9.3 mg/dL (ref 8.9–10.3)
Chloride: 99 mmol/L (ref 98–111)
Creatinine, Ser: 0.72 mg/dL (ref 0.44–1.00)
GFR, Estimated: >60 mL/min (ref >60)
Glucose, Bld: 91 mg/dL (ref 70–99)
Potassium: 3.8 mmol/L (ref 3.5–5.1)
Sodium: 136 mmol/L (ref 135–145)
Total Bilirubin: 0.4 mg/dL (ref 0.3–1.2)
Total Protein: 6.5 g/dL (ref 6.5–8.1)

## 2021-06-26 MED ORDER — HEPARIN SOD (PORK) LOCK FLUSH 100 UNIT/ML IV SOLN
500.0000 [IU] | Freq: Once | INTRAVENOUS | Status: AC
  Administered 2021-06-26: 500 [IU]

## 2021-06-26 MED ORDER — SODIUM CHLORIDE 0.9% FLUSH
10.0000 mL | Freq: Once | INTRAVENOUS | Status: AC
  Administered 2021-06-26: 10 mL

## 2021-06-26 NOTE — Research (Signed)
Trial Name:  [id number]: Use of a Clinical Trial Screening Tool to Address Cancer Health Disparities in the [facility])   Patient Sabrina Wright was identified by this Clinical Research Nurse as a potential candidate for the above listed study.  This Clinical Research Nurse met with Sabrina Wright, [id number] on 06/26/21 in a manner and location that ensures patient privacy to discuss participation in the above listed research study.  Patient is Accompanied by spouse [name] .  Patient was previously provided with informed consent documents.  Patient has not yet read the informed consent documents and so documents were reviewed page by page today.  As outlined in the informed consent form, this Nurse and [name] discussed the purpose of the research study, the investigational nature of the study, study procedures and requirements for study participation, potential risks and benefits of study participation, as well as alternatives to participation.  This study is not blinded or double-blinded. The patient understands participation is voluntary and they may withdraw from study participation at any time.  This study does not involve randomization.  This study does not involve an investigational drug or device. This study does not involve a placebo. Patient understands enrollment is pending full eligibility review.   Confidentiality and how the patient's information will be used as part of study participation were discussed.  Patient was informed there is not reimbursement provided for their time and effort spent on trial participation.  The patient is encouraged to discuss research study participation with their insurance provider to determine what costs they may incur as part of study participation, including research related injury.    All questions were answered to patient's satisfaction.  The informed consent and separate HIPAA Authorization was reviewed page by  page.  The patient's mental and emotional status is appropriate to provide informed consent, and the patient verbalizes an understanding of study participation.  Patient has agreed to participate in the above listed research study and has voluntarily signed the informed consent version date 04/27/2019 and separate HIPAA Authorization, version date 10/20/2019  on 06/26/21 at 1035 AM.  The patient was provided with a copy of the signed informed consent form and separate HIPAA Authorization for their reference.  No study specific procedures were obtained prior to the signing of the informed consent document.  Approximately 20 minutes were spent with the patient reviewing the informed consent documents.  Patient was not requested to complete a Release of Information form.  Wells Guiles 'Ranger, RN, BSN Clinical Research Nurse I 06/26/21 11:04 AM

## 2021-06-26 NOTE — Progress Notes (Signed)
[facility] Psychosocial Distress Screening & Progress Note Clinical Social Work  Clinical Social Work met with patient in exam room to follow-up on resource needs. Signed up patient for [employer] and gave first set (of four) of gift cards.  Provided applications for breast cancer foundations and reviewed what supporting documents are needed. Provided bag of food from food pantry.    Patient reported that it seems like the short-term disability paperwork is being corrected and she is hopeful that this problem will be sorted out soon.  Patient also completed distress screen. The patient scored a 5 on the Psychosocial Distress Thermometer which indicates moderate distress.    ONCBCN DISTRESS SCREENING 06/25/2021  Screening Type Initial Screening  Distress experienced in past week (1-10) 5  Practical problem type Insurance  Emotional problem type Depression;Nervousness/Anxiety;Adjusting to illness  Physical Problem type Pain;Sleep/insomnia  Referral to clinical social work Yes    Follow-up plan: patient to contact CSW for assistance with applications. Patient may contact CSW or come to support services when ready for next [place] cards and/or bag from food pantry   [place], LCSW

## 2021-06-27 ENCOUNTER — Other Ambulatory Visit: Payer: BC Managed Care – PPO

## 2021-06-28 ENCOUNTER — Other Ambulatory Visit: Payer: BC Managed Care – PPO

## 2021-06-30 NOTE — Telephone Encounter (Signed)
Per pt message 06/25/21:  Forde Dandy: Sunday Corn (Newest Message First) June 29, 2021 [name], MD to Sabrina Wright     6:51 AM I think probably best to hold prolia for now, but check with your oncologist as there's a different medicine they may want to use.  If they want you to receive prolia let us know and we can proceed with it.  I'm also glad we checked both breasts!  Dr Danise Mina  Last read by Sabrina Wright at 7:42 AM on 06/29/2021. June 25, 2021      2:36 PM [name], CMA routed this conversation to [name], MD  [name] C to [name] (supporting [name], MD)     10:41 AM I'm receiving emails from my specialty pharmacy about my [place] shit. Since I'm having Chemo at this time should I order the medication?  I would also like to thank you, Dr Danise Mina, for scheduling the extra ultrasound on my right breast.

## 2021-07-04 ENCOUNTER — Encounter: Payer: BC Managed Care – PPO | Admitting: Family Medicine

## 2021-07-06 ENCOUNTER — Other Ambulatory Visit: Payer: Self-pay | Admitting: Family Medicine

## 2021-07-06 MED FILL — Dexamethasone Sodium Phosphate Inj 100 MG/10ML: INTRAMUSCULAR | Qty: 1 | Status: AC

## 2021-07-06 MED FILL — Fosaprepitant Dimeglumine For IV Infusion 150 MG (Base Eq): INTRAVENOUS | Qty: 5 | Status: AC

## 2021-07-06 NOTE — Telephone Encounter (Signed)
Meloxicam Last filled:  06/07/21, #30 Last OV:  05/22/21, L breast pain Next OV:  none

## 2021-07-08 NOTE — Progress Notes (Signed)
Patient Care Team: [name], MD as PCP - General (Family Medicine) Mauro Kaufmann, RN as Oncology Nurse Navigator Rockwell Germany, RN as Oncology Nurse Navigator  DIAGNOSIS:    ICD-10-CM   1. Malignant neoplasm of upper-inner quadrant of right breast in female, estrogen receptor negative ([place])  C50.211    Z17.1       SUMMARY OF ONCOLOGIC HISTORY: Oncology History  History of left breast cancer  06/05/2021 Initial Biopsy   History of left breast cancer status postlumpectomy 2003 Pain in the right breast: Mammogram and ultrasound revealed indeterminate masses in the right breast spanning 2.4 cm biopsy revealed grade 3 IDC ER 5%, PR 0%, Ki67 60%, HER2 negative   Pain of left breast  Malignant neoplasm of upper-inner quadrant of right breast in female, estrogen receptor negative ([place])  06/05/2021 Initial Biopsy   History of left breast cancer status postlumpectomy 2003 Pain in the right breast: Mammogram and ultrasound revealed indeterminate masses in the right breast spanning 2.4 cm biopsy revealed grade 3 IDC ER 5%, PR 0%, Ki67 60%, HER2 negative   06/11/2021 Cancer Staging   Staging form: Breast, AJCC 8th Edition - Clinical stage from 06/11/2021: Stage IIB (cT2, cN0, cM0, G3, ER-, PR-, HER2-) - Signed by Nicholas Lose, MD on 06/11/2021 Stage prefix: Initial diagnosis Histologic grading system: 3 grade system   06/19/2021 -  Chemotherapy    Patient is on Treatment Plan: BREAST TC Q21D         CHIEF COMPLIANT: Cycle 2 TC  INTERVAL HISTORY: Sabrina Wright is a 60 y.o. with above-mentioned history of right breast cancer currently on chemotherapy with Taxotere and Cytoxan. She presents to the clinic today for treatment.  Because of severe bone pain, we decided to discontinue Neulasta injection with this round.  Other than fatigue she is doing quite well.  Does not have any nausea or vomiting and cannot take any of her nausea medications.  ALLERGIES:  is allergic to codeine,  eggs or egg-derived products, and gadavist [gadobutrol].  MEDICATIONS:  Current Outpatient Medications  Medication Sig Dispense Refill   acetaminophen (TYLENOL) 325 MG tablet Take 2 tablets (650 mg total) by mouth every 6 (six) hours as needed for fever, headache or moderate pain.     ALPRAZolam (XANAX) 0.5 MG tablet Take 0.5-1 tablets (0.25-0.5 mg total) by mouth at bedtime. 30 tablet 1   Calcium Carb-Cholecalciferol (CALCIUM 600/VITAMIN D3) 600-800 MG-UNIT TABS Take 2 tablets by mouth daily. 60 tablet    Cholecalciferol (VITAMIN D) 50 MCG (2000 UT) CAPS Take 1 capsule (2,000 Units total) by mouth daily. 30 capsule    citalopram (CELEXA) 20 MG tablet TAKE 1 TABLET BY MOUTH EVERY DAY 90 tablet 0   clobetasol cream (TEMOVATE) 7.61 % APPLY 1 APPLICATION TOPICALLY 2 (TWO) TIMES DAILY. APPLY TO AFFECTED AREA NO MORE THAN 2 WEEKS AT A TIME+ (Patient taking differently: Apply 1 application topically 2 (two) times daily as needed (rash). APPLY TO AFFECTED AREA NO MORE THAN 2 WEEKS AT A TIME) 30 g 0   denosumab (PROLIA) 60 MG/ML SOSY injection INJECT 60MG SUBCUTANEOUSLY  EVERY 6 MONTHS (GIVEN AT  PRESCRIBERS OFFICE) (Patient taking differently: Inject 60 mg into the skin every 6 (six) months. INJECT 60MG SUBCUTANEOUSLY  EVERY 6 MONTHS (GIVEN AT  PRESCRIBERS OFFICE)) 1 mL 1   dexamethasone (DECADRON) 4 MG tablet Take 1 tablet (4 mg total) by mouth daily. Start the day before Taxotere. Then again the day after chemo with food  12 tablet 0   lidocaine-prilocaine (EMLA) cream Apply to affected area once (Patient not taking: Reported on 06/25/2021) 30 g 3   loratadine (CLARITIN) 10 MG tablet Take 10 mg by mouth as needed for allergies.     meloxicam (MOBIC) 7.5 MG tablet TAKE 1 TABLET BY MOUTH EVERY DAY 30 tablet 6   Multiple Vitamin (MULTIVITAMIN WITH MINERALS) TABS tablet Take 1 tablet by mouth at bedtime.     ondansetron (ZOFRAN) 8 MG tablet Take 1 tablet (8 mg total) by mouth 2 (two) times daily as needed for  refractory nausea / vomiting. Start on day 3 after chemo. (Patient not taking: Reported on 06/25/2021) 30 tablet 1   prochlorperazine (COMPAZINE) 10 MG tablet Take 1 tablet (10 mg total) by mouth every 6 (six) hours as needed (Nausea or vomiting). (Patient not taking: Reported on 06/25/2021) 30 tablet 1   SUMAtriptan (IMITREX) 100 MG tablet TAKE 1 TABLET BY MOUTH ONCE FOR 1 DOSE. MAY REPEAT IN 2 HOURS IF HEADACHE PERSISTS OR RECURS. 9 tablet 3   traMADol (ULTRAM) 50 MG tablet Take 1 tablet (50 mg total) by mouth every 6 (six) hours as needed. 30 tablet 0   vitamin B-12 (V-R VITAMIN B-12) 500 MCG tablet Take 1 tablet (500 mcg total) by mouth daily.     No current facility-administered medications for this visit.    PHYSICAL EXAMINATION: ECOG PERFORMANCE STATUS: 1 - Symptomatic but completely ambulatory  Vitals:   07/10/21 1118  BP: 130/69  Pulse: 71  Resp: 18  Temp: 97.8 F (36.6 C)  SpO2: 98%   Filed Weights   07/10/21 1118  Weight: 168 lb 11.2 oz (76.5 kg)    LABORATORY DATA:  I have reviewed the data as listed CMP Latest Ref Rng & Units 06/26/2021 06/18/2021 01/03/2021  Glucose 70 - 99 mg/dL 91 81 100(H)  BUN 6 - 20 mg/dL _0 Creatinine 0.44 - 1.00 mg/dL 0.72 0.72 0.77  Sodium 135 - 145 mmol/L 136 140 138  Potassium 3.5 - 5.1 mmol/L 3.8 4.1 3.9  Chloride 98 - 111 mmol/L 99 105 104  CO2 22 - 32 mmol/L _1 Calcium 8.9 - 10.3 mg/dL 9.3 9.2 9.5  Total Protein 6.5 - 8.1 g/dL 6.5 6.9 7.1  Total Bilirubin 0.3 - 1.2 mg/dL 0.4 0.5 0.9  Alkaline Phos 38 - 126 U/L 67 60 62  AST 15 - 41 U/L 25 23 60(H)  ALT 0 - 44 U/L 19 23 57(H)    Lab Results  Component Value Date   WBC 12.9 (H) 07/10/2021   HGB 12.1 07/10/2021   HCT 37.1 07/10/2021   MCV 87.3 07/10/2021   PLT 637 (H) 07/10/2021   NEUTROABS 10.3 (H) 07/10/2021    ASSESSMENT & PLAN:  Malignant neoplasm of upper-inner quadrant of right breast in female, estrogen receptor negative ([place]) History of left breast cancer  status postlumpectomy 2003 status post chemo with Adriamycin, radiation 06/05/2021: Pain in the right breast: Mammogram and ultrasound revealed indeterminate masses in the right breast spanning 2.4 cm biopsy revealed grade 3 IDC ER 5%, PR 0%, 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  Neoadjuvant chemotherapy with Taxotere and Cytoxan x4 2. breast conserving surgery with sentinel lymph node biopsy 3.  Adjuvant radiation therapy 4.  Consideration for adjuvant antiestrogen therapy with a 5% ER positivity. ----------------------------------------------------------------------------------------------------------------------- Current Treatment: Cycle 2 Taxotere Cytoxan.(started 06/19/2021) Chemo Toxicities: Severe bone pain from Neulasta: Discontinue Neulasta with this treatment.  If  her counts do not recover then we may have to reinitiate it at a later time.. Fatigue   Denies any nausea or vomiting. Severe anxiety: on Xanax. RTC in 3 weeks for cycle 3    No orders of the defined types were placed in this encounter.  The patient has a good understanding of the overall plan. she agrees with it. she will call with any problems that may develop before the next visit here.  Total time spent: 30 mins including face to face time and time spent for planning, charting and coordination of care  Rulon Eisenmenger, MD, MPH 07/10/2021  I, Thana Ates, am acting as scribe for Dr. Nicholas Lose.  I have reviewed the above documentation for accuracy and completeness, and I agree with the above.

## 2021-07-10 ENCOUNTER — Inpatient Hospital Stay: Payer: BC Managed Care – PPO

## 2021-07-10 ENCOUNTER — Inpatient Hospital Stay (HOSPITAL_BASED_OUTPATIENT_CLINIC_OR_DEPARTMENT_OTHER): Payer: BC Managed Care – PPO | Admitting: Hematology and Oncology

## 2021-07-10 ENCOUNTER — Other Ambulatory Visit: Payer: Self-pay

## 2021-07-10 ENCOUNTER — Inpatient Hospital Stay: Payer: BC Managed Care – PPO | Attending: Hematology and Oncology

## 2021-07-10 DIAGNOSIS — Z171 Estrogen receptor negative status [ER-]: Secondary | ICD-10-CM | POA: Insufficient documentation

## 2021-07-10 DIAGNOSIS — K5900 Constipation, unspecified: Secondary | ICD-10-CM | POA: Insufficient documentation

## 2021-07-10 DIAGNOSIS — Z79899 Other long term (current) drug therapy: Secondary | ICD-10-CM | POA: Diagnosis not present

## 2021-07-10 DIAGNOSIS — R5383 Other fatigue: Secondary | ICD-10-CM | POA: Diagnosis not present

## 2021-07-10 DIAGNOSIS — Z7952 Long term (current) use of systemic steroids: Secondary | ICD-10-CM | POA: Diagnosis not present

## 2021-07-10 DIAGNOSIS — Z923 Personal history of irradiation: Secondary | ICD-10-CM | POA: Diagnosis not present

## 2021-07-10 DIAGNOSIS — M898X0 Other specified disorders of bone, multiple sites: Secondary | ICD-10-CM | POA: Insufficient documentation

## 2021-07-10 DIAGNOSIS — Z5111 Encounter for antineoplastic chemotherapy: Secondary | ICD-10-CM | POA: Insufficient documentation

## 2021-07-10 DIAGNOSIS — C50211 Malignant neoplasm of upper-inner quadrant of right female breast: Secondary | ICD-10-CM | POA: Insufficient documentation

## 2021-07-10 DIAGNOSIS — Z9221 Personal history of antineoplastic chemotherapy: Secondary | ICD-10-CM | POA: Insufficient documentation

## 2021-07-10 DIAGNOSIS — Z95828 Presence of other vascular implants and grafts: Secondary | ICD-10-CM

## 2021-07-10 LAB — COMPREHENSIVE METABOLIC PANEL
ALT: 14 U/L (ref 0–44)
AST: 17 U/L (ref 15–41)
Albumin: 3.9 g/dL (ref 3.5–5.0)
Alkaline Phosphatase: 70 U/L (ref 38–126)
Anion gap: 10 (ref 5–15)
BUN: 17 mg/dL (ref 6–20)
CO2: 24 mmol/L (ref 22–32)
Calcium: 10 mg/dL (ref 8.9–10.3)
Chloride: 105 mmol/L (ref 98–111)
Creatinine, Ser: 0.71 mg/dL (ref 0.44–1.00)
GFR, Estimated: >60 mL/min (ref >60)
Glucose, Bld: 89 mg/dL (ref 70–99)
Potassium: 4.1 mmol/L (ref 3.5–5.1)
Sodium: 139 mmol/L (ref 135–145)
Total Bilirubin: 0.4 mg/dL (ref 0.3–1.2)
Total Protein: 7.2 g/dL (ref 6.5–8.1)

## 2021-07-10 LAB — CBC WITH DIFFERENTIAL/PLATELET
Abs Immature Granulocytes: 0.05 10*3/uL (ref 0.00–0.07)
Basophils Absolute: 0 10*3/uL (ref 0.0–0.1)
Basophils Relative: 0 %
Eosinophils Absolute: 0 10*3/uL (ref 0.0–0.5)
Eosinophils Relative: 0 %
HCT: 37.1 % (ref 36.0–46.0)
Hemoglobin: 12.1 g/dL (ref 12.0–15.0)
Immature Granulocytes: 0 %
Lymphocytes Relative: 10 %
Lymphs Abs: 1.3 10*3/uL (ref 0.7–4.0)
MCH: 28.5 pg (ref 26.0–34.0)
MCHC: 32.6 g/dL (ref 30.0–36.0)
MCV: 87.3 fL (ref 80.0–100.0)
Monocytes Absolute: 1.2 10*3/uL — ABNORMAL HIGH (ref 0.1–1.0)
Monocytes Relative: 10 %
Neutro Abs: 10.3 10*3/uL — ABNORMAL HIGH (ref 1.7–7.7)
Neutrophils Relative %: 80 %
Platelets: 637 10*3/uL — ABNORMAL HIGH (ref 150–400)
RBC: 4.25 MIL/uL (ref 3.87–5.11)
RDW: 14.7 % (ref 11.5–15.5)
WBC: 12.9 10*3/uL — ABNORMAL HIGH (ref 4.0–10.5)
nRBC: 0 % (ref 0.0–0.2)

## 2021-07-10 MED ORDER — SODIUM CHLORIDE 0.9% FLUSH
10.0000 mL | Freq: Once | INTRAVENOUS | Status: AC
  Administered 2021-07-10: 10 mL

## 2021-07-10 MED ORDER — SODIUM CHLORIDE 0.9 % IV SOLN
150.0000 mg | Freq: Once | INTRAVENOUS | Status: AC
  Administered 2021-07-10: 150 mg via INTRAVENOUS
  Filled 2021-07-10: qty 150

## 2021-07-10 MED ORDER — SODIUM CHLORIDE 0.9 % IV SOLN
60.0000 mg/m2 | Freq: Once | INTRAVENOUS | Status: AC
  Administered 2021-07-10: 110 mg via INTRAVENOUS
  Filled 2021-07-10: qty 11

## 2021-07-10 MED ORDER — SODIUM CHLORIDE 0.9 % IV SOLN
10.0000 mg | Freq: Once | INTRAVENOUS | Status: AC
  Administered 2021-07-10: 10 mg via INTRAVENOUS
  Filled 2021-07-10: qty 10

## 2021-07-10 MED ORDER — PALONOSETRON HCL INJECTION 0.25 MG/5ML
0.2500 mg | Freq: Once | INTRAVENOUS | Status: AC
  Administered 2021-07-10: 0.25 mg via INTRAVENOUS
  Filled 2021-07-10: qty 5

## 2021-07-10 MED ORDER — SODIUM CHLORIDE 0.9 % IV SOLN: Freq: Once | INTRAVENOUS | Status: AC

## 2021-07-10 MED ORDER — SODIUM CHLORIDE 0.9% FLUSH
10.0000 mL | INTRAVENOUS | Status: DC | PRN
  Administered 2021-07-10: 10 mL

## 2021-07-10 MED ORDER — SODIUM CHLORIDE 0.9 % IV SOLN
600.0000 mg/m2 | Freq: Once | INTRAVENOUS | Status: AC
  Administered 2021-07-10: 1120 mg via INTRAVENOUS
  Filled 2021-07-10: qty 56

## 2021-07-10 MED ORDER — HEPARIN SOD (PORK) LOCK FLUSH 100 UNIT/ML IV SOLN
500.0000 [IU] | Freq: Once | INTRAVENOUS | Status: AC | PRN
  Administered 2021-07-10: 500 [IU]

## 2021-07-10 NOTE — Assessment & Plan Note (Signed)
History of left breast cancer status postlumpectomy 2003status post chemo with Adriamycin, radiation 06/05/2021:Pain in the right breast: Mammogram and ultrasound revealed indeterminate masses in the right breast spanning 2.4 cm biopsy revealed grade 3 IDC ER 5%, PR 0%, Ki67 60%, HER2 negative  Treatment plan: 1.Neoadjuvant chemotherapy with Taxotereand Cytoxanx4 2.breast conserving surgery with sentinel lymph node biopsy 3.Adjuvant radiation therapy 4.Consideration for adjuvant antiestrogen therapy with a 5% ER positivity. ----------------------------------------------------------------------------------------------------------------------- Current Treatment: Cycle 2 Taxotere Cytoxan.(started 06/19/2021) Chemo Toxicities: 1. Severe bone pain from Neulasta: We discussed different options and decided to discontinue it for the next treatment onwards. 2. Fatigue  Denies any nausea or vomiting. Severe anxiety: on Xanax. RTC in 3 weeks for cycle 3

## 2021-07-10 NOTE — Patient Instructions (Signed)
[facility]  Discharge Instructions: Thank you for choosing [facility] to provide your oncology and hematology care.   If you have a lab appointment with the [facility], please go directly to the [facility] and check in at the registration area.   Wear comfortable clothing and clothing appropriate for easy access to any Portacath or PICC line.   We strive to give you quality time with your provider. You may need to reschedule your appointment if you arrive late (15 or more minutes).  Arriving late affects you and other patients whose appointments are after yours.  Also, if you miss three or more appointments without notifying the office, you may be dismissed from the clinic at the provider's discretion.      For prescription refill requests, have your pharmacy contact our office and allow 72 hours for refills to be completed.    Today you received the following chemotherapy and/or immunotherapy agents: Taxotere & Cytoxan   To help prevent nausea and vomiting after your treatment, we encourage you to take your nausea medication as directed.  BELOW ARE SYMPTOMS THAT SHOULD BE REPORTED IMMEDIATELY: *FEVER GREATER THAN 100.4 F (38 C) OR HIGHER *CHILLS OR SWEATING *NAUSEA AND VOMITING THAT IS NOT CONTROLLED WITH YOUR NAUSEA MEDICATION *UNUSUAL SHORTNESS OF BREATH *UNUSUAL BRUISING OR BLEEDING *URINARY PROBLEMS (pain or burning when urinating, or frequent urination) *BOWEL PROBLEMS (unusual diarrhea, constipation, pain near the anus) TENDERNESS IN MOUTH AND THROAT WITH OR WITHOUT PRESENCE OF ULCERS (sore throat, sores in mouth, or a toothache) UNUSUAL RASH, SWELLING OR PAIN  UNUSUAL VAGINAL DISCHARGE OR ITCHING   Items with * indicate a potential emergency and should be followed up as soon as possible or go to the Emergency Department if any problems should occur.  Please show the CHEMOTHERAPY ALERT CARD or IMMUNOTHERAPY ALERT CARD at  check-in to the Emergency Department and triage nurse.  Should you have questions after your visit or need to cancel or reschedule your appointment, please contact [facility]  Dept: [phone]  and follow the prompts.  Office hours are 8:00 a.m. to 4:30 p.m. Monday - Friday. Please note that voicemails left after 4:00 p.m. may not be returned until the following business day.  We are closed weekends and major holidays. You have access to a nurse at all times for urgent questions. Please call the main number to the clinic Dept: [phone] and follow the prompts.   For any non-urgent questions, you may also contact your provider using MyChart. We now offer e-Visits for anyone 60 and older to request care online for non-urgent symptoms. For details visit [email or web address].   Also download the MyChart app! Go to the app store, search "MyChart", open the app, select [facility], and log in with your MyChart username and password.  Due to Covid, a mask is required upon entering the hospital/clinic. If you do not have a mask, one will be given to you upon arrival. For doctor visits, patients may have 1 support person aged 60 or older with them. For treatment visits, patients cannot have anyone with them due to current Covid guidelines and our immunocompromised population.

## 2021-07-12 ENCOUNTER — Ambulatory Visit: Payer: BC Managed Care – PPO

## 2021-07-30 MED FILL — Dexamethasone Sodium Phosphate Inj 100 MG/10ML: INTRAMUSCULAR | Qty: 1 | Status: AC

## 2021-07-30 MED FILL — Fosaprepitant Dimeglumine For IV Infusion 150 MG (Base Eq): INTRAVENOUS | Qty: 5 | Status: AC

## 2021-07-30 NOTE — Progress Notes (Signed)
Patient Care Team: [name], MD as PCP - General (Family Medicine) Mauro Kaufmann, RN as Oncology Nurse Navigator Rockwell Germany, RN as Oncology Nurse Navigator  DIAGNOSIS:    ICD-10-CM   1. Malignant neoplasm of upper-inner quadrant of right breast in female, estrogen receptor negative ([place])  C50.211    Z17.1       SUMMARY OF ONCOLOGIC HISTORY: Oncology History  History of left breast cancer  06/05/2021 Initial Biopsy   History of left breast cancer status postlumpectomy 2003 Pain in the right breast: Mammogram and ultrasound revealed indeterminate masses in the right breast spanning 2.4 cm biopsy revealed grade 3 IDC ER 5%, PR 0%, Ki67 60%, HER2 negative   Pain of left breast  Malignant neoplasm of upper-inner quadrant of right breast in female, estrogen receptor negative ([place])  06/05/2021 Initial Biopsy   History of left breast cancer status postlumpectomy 2003 Pain in the right breast: Mammogram and ultrasound revealed indeterminate masses in the right breast spanning 2.4 cm biopsy revealed grade 3 IDC ER 5%, PR 0%, Ki67 60%, HER2 negative   06/11/2021 Cancer Staging   Staging form: Breast, AJCC 8th Edition - Clinical stage from 06/11/2021: Stage IIB (cT2, cN0, cM0, G3, ER-, PR-, HER2-) - Signed by Nicholas Lose, MD on 06/11/2021 Stage prefix: Initial diagnosis Histologic grading system: 3 grade system   06/19/2021 -  Chemotherapy    Patient is on Treatment Plan: BREAST TC Q21D         CHIEF COMPLIANT: Cycle 3 TC  INTERVAL HISTORY: Sabrina Wright is a 60 y.o. with above-mentioned history of right breast cancer currently on chemotherapy with Taxotere and Cytoxan. She presents to the clinic today for treatment.  Her major complaint today is severe fatigue.  This past week she has been feeling slightly better.  She had constipation which got better with stool softeners and laxatives.  She also had nausea but she did not take nausea medication because of concern for  constipation.  She has good appetite and is eating well.  ALLERGIES:  is allergic to codeine, eggs or egg-derived products, and gadavist [gadobutrol].  MEDICATIONS:  Current Outpatient Medications  Medication Sig Dispense Refill   acetaminophen (TYLENOL) 325 MG tablet Take 2 tablets (650 mg total) by mouth every 6 (six) hours as needed for fever, headache or moderate pain.     ALPRAZolam (XANAX) 0.5 MG tablet Take 0.5-1 tablets (0.25-0.5 mg total) by mouth at bedtime. 30 tablet 1   Calcium Carb-Cholecalciferol (CALCIUM 600/VITAMIN D3) 600-800 MG-UNIT TABS Take 2 tablets by mouth daily. 60 tablet    Cholecalciferol (VITAMIN D) 50 MCG (2000 UT) CAPS Take 1 capsule (2,000 Units total) by mouth daily. 30 capsule    citalopram (CELEXA) 20 MG tablet TAKE 1 TABLET BY MOUTH EVERY DAY 90 tablet 0   clobetasol cream (TEMOVATE) 3.23 % APPLY 1 APPLICATION TOPICALLY 2 (TWO) TIMES DAILY. APPLY TO AFFECTED AREA NO MORE THAN 2 WEEKS AT A TIME+ (Patient taking differently: Apply 1 application topically 2 (two) times daily as needed (rash). APPLY TO AFFECTED AREA NO MORE THAN 2 WEEKS AT A TIME) 30 g 0   denosumab (PROLIA) 60 MG/ML SOSY injection INJECT 60MG SUBCUTANEOUSLY  EVERY 6 MONTHS (GIVEN AT  PRESCRIBERS OFFICE) (Patient taking differently: Inject 60 mg into the skin every 6 (six) months. INJECT 60MG SUBCUTANEOUSLY  EVERY 6 MONTHS (GIVEN AT  PRESCRIBERS OFFICE)) 1 mL 1   dexamethasone (DECADRON) 4 MG tablet Take 1 tablet (4 mg total)  by mouth daily. Start the day before Taxotere. Then again the day after chemo with food 12 tablet 0   lidocaine-prilocaine (EMLA) cream Apply to affected area once (Patient not taking: Reported on 06/25/2021) 30 g 3   loratadine (CLARITIN) 10 MG tablet Take 10 mg by mouth as needed for allergies.     meloxicam (MOBIC) 7.5 MG tablet TAKE 1 TABLET BY MOUTH EVERY DAY 30 tablet 6   Multiple Vitamin (MULTIVITAMIN WITH MINERALS) TABS tablet Take 1 tablet by mouth at bedtime.      ondansetron (ZOFRAN) 8 MG tablet Take 1 tablet (8 mg total) by mouth 2 (two) times daily as needed for refractory nausea / vomiting. Start on day 3 after chemo. (Patient not taking: Reported on 06/25/2021) 30 tablet 1   prochlorperazine (COMPAZINE) 10 MG tablet Take 1 tablet (10 mg total) by mouth every 6 (six) hours as needed (Nausea or vomiting). (Patient not taking: Reported on 06/25/2021) 30 tablet 1   SUMAtriptan (IMITREX) 100 MG tablet TAKE 1 TABLET BY MOUTH ONCE FOR 1 DOSE. MAY REPEAT IN 2 HOURS IF HEADACHE PERSISTS OR RECURS. 9 tablet 3   traMADol (ULTRAM) 50 MG tablet Take 1 tablet (50 mg total) by mouth every 6 (six) hours as needed. 30 tablet 0   vitamin B-12 (V-R VITAMIN B-12) 500 MCG tablet Take 1 tablet (500 mcg total) by mouth daily.     No current facility-administered medications for this visit.    PHYSICAL EXAMINATION: ECOG PERFORMANCE STATUS: 1 - Symptomatic but completely ambulatory  Vitals:   07/31/21 1115  BP: 115/67  Pulse: 64  Resp: 18  Temp: (!) 97.3 F (36.3 C)  SpO2: 99%   Filed Weights   07/31/21 1115  Weight: 172 lb 14.4 oz (78.4 kg)    LABORATORY DATA:  I have reviewed the data as listed CMP Latest Ref Rng & Units 07/10/2021 06/26/2021 06/18/2021  Glucose 70 - 99 mg/dL 89 91 81  BUN 6 - 20 mg/dL _0 Creatinine 0.44 - 1.00 mg/dL 0.71 0.72 0.72  Sodium 135 - 145 mmol/L 139 136 140  Potassium 3.5 - 5.1 mmol/L 4.1 3.8 4.1  Chloride 98 - 111 mmol/L 105 99 105  CO2 22 - 32 mmol/L _1 Calcium 8.9 - 10.3 mg/dL 10.0 9.3 9.2  Total Protein 6.5 - 8.1 g/dL 7.2 6.5 6.9  Total Bilirubin 0.3 - 1.2 mg/dL 0.4 0.4 0.5  Alkaline Phos 38 - 126 U/L 70 67 60  AST 15 - 41 U/L _2 ALT 0 - 44 U/L _3 Lab Results  Component Value Date   WBC 12.9 (H) 07/10/2021   HGB 12.1 07/10/2021   HCT 37.1 07/10/2021   MCV 87.3 07/10/2021   PLT 637 (H) 07/10/2021   NEUTROABS 10.3 (H) 07/10/2021    ASSESSMENT & PLAN:  Malignant neoplasm of upper-inner  quadrant of right breast in female, estrogen receptor negative ([place]) History of left breast cancer status postlumpectomy 2003 status post chemo with Adriamycin, radiation 06/05/2021: Pain in the right breast: Mammogram and ultrasound revealed indeterminate masses in the right breast spanning 2.4 cm biopsy revealed grade 3 IDC ER 5%, PR 0%, 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  Neoadjuvant chemotherapy with Taxotere and Cytoxan x4 2. breast conserving surgery with sentinel lymph node biopsy 3.  Adjuvant radiation therapy 4.  Consideration for adjuvant antiestrogen therapy with a 5% ER positivity. ----------------------------------------------------------------------------------------------------------------------- Current Treatment: Cycle 3  Taxotere Cytoxan.(started 06/19/2021) Chemo Toxicities: Severe bone pain from Neulasta: Given without Neulasta, her blood counts have remained stable therefore we do not need to administer Neulasta again. Fatigue: Continues to be a problem.  It could be related to chemo but also compounded by underlying emotional issues.   Denies any nausea or vomiting. Severe anxiety: on Xanax. RTC in 3 weeks for cycle 4 Breast MRI will need to be scheduled after that an appointment with surgery I sent a message to our navigators and Dr. Marlou Starks to arrange for the next steps.   No orders of the defined types were placed in this encounter.  The patient has a good understanding of the overall plan. she agrees with it. she will call with any problems that may develop before the next visit here.  Total time spent: 30 mins including face to face time and time spent for planning, charting and coordination of care  Rulon Eisenmenger, MD, MPH 07/31/2021  I, Thana Ates, am acting as scribe for Dr. Nicholas Lose.  I have reviewed the above documentation for accuracy and completeness, and I agree with the above.

## 2021-07-30 NOTE — Telephone Encounter (Signed)
Can you reach out to pt regarding prolia see if onc recommended anything different? I would opt for proceeding with injection assuming onc doesn't have other recommendations.  Also, her mychart had a typo - she meant "Prolia shot"

## 2021-07-30 NOTE — Telephone Encounter (Signed)
Dr Darnell Level, did you want me to follow with patient on Prolia injection and if she had discussion with her oncologist about this? Or did you want to reach out to her first?

## 2021-07-31 ENCOUNTER — Inpatient Hospital Stay: Payer: BC Managed Care – PPO

## 2021-07-31 ENCOUNTER — Other Ambulatory Visit: Payer: Self-pay

## 2021-07-31 ENCOUNTER — Encounter: Payer: Self-pay | Admitting: *Deleted

## 2021-07-31 ENCOUNTER — Inpatient Hospital Stay (HOSPITAL_BASED_OUTPATIENT_CLINIC_OR_DEPARTMENT_OTHER): Payer: BC Managed Care – PPO | Admitting: Hematology and Oncology

## 2021-07-31 DIAGNOSIS — C50211 Malignant neoplasm of upper-inner quadrant of right female breast: Secondary | ICD-10-CM

## 2021-07-31 DIAGNOSIS — Z171 Estrogen receptor negative status [ER-]: Secondary | ICD-10-CM

## 2021-07-31 DIAGNOSIS — K5900 Constipation, unspecified: Secondary | ICD-10-CM | POA: Diagnosis not present

## 2021-07-31 DIAGNOSIS — Z9221 Personal history of antineoplastic chemotherapy: Secondary | ICD-10-CM | POA: Diagnosis not present

## 2021-07-31 DIAGNOSIS — M898X0 Other specified disorders of bone, multiple sites: Secondary | ICD-10-CM | POA: Diagnosis not present

## 2021-07-31 DIAGNOSIS — Z5111 Encounter for antineoplastic chemotherapy: Secondary | ICD-10-CM | POA: Diagnosis not present

## 2021-07-31 DIAGNOSIS — Z7952 Long term (current) use of systemic steroids: Secondary | ICD-10-CM | POA: Diagnosis not present

## 2021-07-31 DIAGNOSIS — R5383 Other fatigue: Secondary | ICD-10-CM | POA: Diagnosis not present

## 2021-07-31 DIAGNOSIS — Z923 Personal history of irradiation: Secondary | ICD-10-CM | POA: Diagnosis not present

## 2021-07-31 DIAGNOSIS — Z79899 Other long term (current) drug therapy: Secondary | ICD-10-CM | POA: Diagnosis not present

## 2021-07-31 DIAGNOSIS — Z95828 Presence of other vascular implants and grafts: Secondary | ICD-10-CM

## 2021-07-31 LAB — COMPREHENSIVE METABOLIC PANEL
ALT: 18 U/L (ref 0–44)
AST: 21 U/L (ref 15–41)
Albumin: 3.5 g/dL (ref 3.5–5.0)
Alkaline Phosphatase: 64 U/L (ref 38–126)
Anion gap: 9 (ref 5–15)
BUN: 11 mg/dL (ref 6–20)
CO2: 24 mmol/L (ref 22–32)
Calcium: 9.4 mg/dL (ref 8.9–10.3)
Chloride: 105 mmol/L (ref 98–111)
Creatinine, Ser: 0.65 mg/dL (ref 0.44–1.00)
GFR, Estimated: >60 mL/min (ref >60)
Glucose, Bld: 81 mg/dL (ref 70–99)
Potassium: 3.9 mmol/L (ref 3.5–5.1)
Sodium: 138 mmol/L (ref 135–145)
Total Bilirubin: 0.2 mg/dL — ABNORMAL LOW (ref 0.3–1.2)
Total Protein: 6.8 g/dL (ref 6.5–8.1)

## 2021-07-31 LAB — CBC WITH DIFFERENTIAL/PLATELET
Abs Immature Granulocytes: 0.09 10*3/uL — ABNORMAL HIGH (ref 0.00–0.07)
Basophils Absolute: 0.1 10*3/uL (ref 0.0–0.1)
Basophils Relative: 0 %
Eosinophils Absolute: 0 10*3/uL (ref 0.0–0.5)
Eosinophils Relative: 0 %
HCT: 33.3 % — ABNORMAL LOW (ref 36.0–46.0)
Hemoglobin: 11.2 g/dL — ABNORMAL LOW (ref 12.0–15.0)
Immature Granulocytes: 1 %
Lymphocytes Relative: 9 %
Lymphs Abs: 1.5 10*3/uL (ref 0.7–4.0)
MCH: 29 pg (ref 26.0–34.0)
MCHC: 33.6 g/dL (ref 30.0–36.0)
MCV: 86.3 fL (ref 80.0–100.0)
Monocytes Absolute: 1.4 10*3/uL — ABNORMAL HIGH (ref 0.1–1.0)
Monocytes Relative: 8 %
Neutro Abs: 13.5 10*3/uL — ABNORMAL HIGH (ref 1.7–7.7)
Neutrophils Relative %: 82 %
Platelets: 389 10*3/uL (ref 150–400)
RBC: 3.86 MIL/uL — ABNORMAL LOW (ref 3.87–5.11)
RDW: 15.2 % (ref 11.5–15.5)
WBC: 16.6 10*3/uL — ABNORMAL HIGH (ref 4.0–10.5)
nRBC: 0 % (ref 0.0–0.2)

## 2021-07-31 MED ORDER — SODIUM CHLORIDE 0.9% FLUSH
10.0000 mL | INTRAVENOUS | Status: DC | PRN
  Administered 2021-07-31: 10 mL

## 2021-07-31 MED ORDER — SODIUM CHLORIDE 0.9 % IV SOLN
10.0000 mg | Freq: Once | INTRAVENOUS | Status: AC
  Administered 2021-07-31: 10 mg via INTRAVENOUS
  Filled 2021-07-31: qty 10

## 2021-07-31 MED ORDER — SODIUM CHLORIDE 0.9 % IV SOLN
600.0000 mg/m2 | Freq: Once | INTRAVENOUS | Status: AC
  Administered 2021-07-31: 1120 mg via INTRAVENOUS
  Filled 2021-07-31: qty 56

## 2021-07-31 MED ORDER — SODIUM CHLORIDE 0.9% FLUSH
10.0000 mL | Freq: Once | INTRAVENOUS | Status: AC
  Administered 2021-07-31: 10 mL

## 2021-07-31 MED ORDER — SODIUM CHLORIDE 0.9 % IV SOLN
60.0000 mg/m2 | Freq: Once | INTRAVENOUS | Status: AC
  Administered 2021-07-31: 110 mg via INTRAVENOUS
  Filled 2021-07-31: qty 11

## 2021-07-31 MED ORDER — HEPARIN SOD (PORK) LOCK FLUSH 100 UNIT/ML IV SOLN
500.0000 [IU] | Freq: Once | INTRAVENOUS | Status: AC | PRN
Start: 2021-07-31 — End: 2021-07-31
  Administered 2021-07-31: 500 [IU]

## 2021-07-31 MED ORDER — PALONOSETRON HCL INJECTION 0.25 MG/5ML
0.2500 mg | Freq: Once | INTRAVENOUS | Status: AC
Start: 2021-07-31 — End: 2021-07-31
  Administered 2021-07-31: 0.25 mg via INTRAVENOUS
  Filled 2021-07-31: qty 5

## 2021-07-31 MED ORDER — SODIUM CHLORIDE 0.9 % IV SOLN
Freq: Once | INTRAVENOUS | Status: AC
Start: 2021-07-31 — End: 2021-07-31

## 2021-07-31 MED ORDER — SODIUM CHLORIDE 0.9 % IV SOLN
150.0000 mg | Freq: Once | INTRAVENOUS | Status: AC
  Administered 2021-07-31: 150 mg via INTRAVENOUS
  Filled 2021-07-31: qty 150

## 2021-07-31 NOTE — Assessment & Plan Note (Signed)
History of left breast cancer status postlumpectomy 2003status post chemo with Adriamycin, radiation 06/05/2021:Pain in the right breast: Mammogram and ultrasound revealed indeterminate masses in the right breast spanning 2.4 cm biopsy revealed grade 3 IDC ER 5%, PR 0%, Ki67 60%, HER2 negative  Treatment plan: 1.Neoadjuvant chemotherapy with Taxotereand Cytoxanx4 2.breast conserving surgery with sentinel lymph node biopsy 3.Adjuvant radiation therapy 4.Consideration for adjuvant antiestrogen therapy with a 5% ER positivity. ----------------------------------------------------------------------------------------------------------------------- Current Treatment: Cycle 3Taxotere Cytoxan.(started8/16/2022) Chemo Toxicities: 1. Severe bone pain from Neulasta: Discontinue Neulasta with this treatment.  If her counts do not recover then we may have to reinitiate it at a later time.. 2. Fatigue  Denies any nausea or vomiting. Severe anxiety:onXanax. RTC in 3 weeks for cycle 4 Breast MRI will need to be scheduled after that an appointment with surgery

## 2021-07-31 NOTE — Telephone Encounter (Signed)
Noted. Will update Prolia book

## 2021-07-31 NOTE — Patient Instructions (Signed)
[facility]  Discharge Instructions: Thank you for choosing [facility] to provide your oncology and hematology care.   If you have a lab appointment with the [facility], please go directly to the [facility] and check in at the registration area.   Wear comfortable clothing and clothing appropriate for easy access to any Portacath or PICC line.   We strive to give you quality time with your provider. You may need to reschedule your appointment if you arrive late (15 or more minutes).  Arriving late affects you and other patients whose appointments are after yours.  Also, if you miss three or more appointments without notifying the office, you may be dismissed from the clinic at the provider's discretion.      For prescription refill requests, have your pharmacy contact our office and allow 72 hours for refills to be completed.    Today you received the following chemotherapy and/or immunotherapy agents: Taxotere & Cytoxan   To help prevent nausea and vomiting after your treatment, we encourage you to take your nausea medication as directed.  BELOW ARE SYMPTOMS THAT SHOULD BE REPORTED IMMEDIATELY: *FEVER GREATER THAN 100.4 F (38 C) OR HIGHER *CHILLS OR SWEATING *NAUSEA AND VOMITING THAT IS NOT CONTROLLED WITH YOUR NAUSEA MEDICATION *UNUSUAL SHORTNESS OF BREATH *UNUSUAL BRUISING OR BLEEDING *URINARY PROBLEMS (pain or burning when urinating, or frequent urination) *BOWEL PROBLEMS (unusual diarrhea, constipation, pain near the anus) TENDERNESS IN MOUTH AND THROAT WITH OR WITHOUT PRESENCE OF ULCERS (sore throat, sores in mouth, or a toothache) UNUSUAL RASH, SWELLING OR PAIN  UNUSUAL VAGINAL DISCHARGE OR ITCHING   Items with * indicate a potential emergency and should be followed up as soon as possible or go to the Emergency Department if any problems should occur.  Please show the CHEMOTHERAPY ALERT CARD or IMMUNOTHERAPY ALERT CARD at  check-in to the Emergency Department and triage nurse.  Should you have questions after your visit or need to cancel or reschedule your appointment, please contact [facility]  Dept: [phone]  and follow the prompts.  Office hours are 8:00 a.m. to 4:30 p.m. Monday - Friday. Please note that voicemails left after 4:00 p.m. may not be returned until the following business day.  We are closed weekends and major holidays. You have access to a nurse at all times for urgent questions. Please call the main number to the clinic Dept: [phone] and follow the prompts.   For any non-urgent questions, you may also contact your provider using MyChart. We now offer e-Visits for anyone 60 and older to request care online for non-urgent symptoms. For details visit [email or web address].   Also download the MyChart app! Go to the app store, search "MyChart", open the app, select [facility], and log in with your MyChart username and password.  Due to Covid, a mask is required upon entering the hospital/clinic. If you do not have a mask, one will be given to you upon arrival. For doctor visits, patients may have 1 support person aged 60 or older with them. For treatment visits, patients cannot have anyone with them due to current Covid guidelines and our immunocompromised population.

## 2021-07-31 NOTE — Telephone Encounter (Signed)
Sent mychart message to the patient. 

## 2021-07-31 NOTE — Telephone Encounter (Signed)
See mychart message response. Will hold of on Prolia

## 2021-08-02 ENCOUNTER — Ambulatory Visit: Payer: BC Managed Care – PPO

## 2021-08-04 ENCOUNTER — Other Ambulatory Visit: Payer: Self-pay | Admitting: Hematology & Oncology

## 2021-08-06 ENCOUNTER — Encounter: Payer: Self-pay | Admitting: Hematology and Oncology

## 2021-08-20 MED FILL — Fosaprepitant Dimeglumine For IV Infusion 150 MG (Base Eq): INTRAVENOUS | Qty: 5 | Status: AC

## 2021-08-20 MED FILL — Dexamethasone Sodium Phosphate Inj 100 MG/10ML: INTRAMUSCULAR | Qty: 1 | Status: AC

## 2021-08-20 NOTE — Progress Notes (Signed)
Patient Care Team: [name], MD as PCP - General (Family Medicine) Mauro Kaufmann, RN as Oncology Nurse Navigator Rockwell Germany, RN as Oncology Nurse Navigator  DIAGNOSIS:    ICD-10-CM   1. Malignant neoplasm of upper-inner quadrant of right breast in female, estrogen receptor negative ([place])  C50.211    Z17.1       SUMMARY OF ONCOLOGIC HISTORY: Oncology History  History of left breast cancer  06/05/2021 Initial Biopsy   History of left breast cancer status postlumpectomy 2003 Pain in the right breast: Mammogram and ultrasound revealed indeterminate masses in the right breast spanning 2.4 cm biopsy revealed grade 3 IDC ER 5%, PR 0%, Ki67 60%, HER2 negative   Pain of left breast  Malignant neoplasm of upper-inner quadrant of right breast in female, estrogen receptor negative ([place])  06/05/2021 Initial Biopsy   History of left breast cancer status postlumpectomy 2003 Pain in the right breast: Mammogram and ultrasound revealed indeterminate masses in the right breast spanning 2.4 cm biopsy revealed grade 3 IDC ER 5%, PR 0%, Ki67 60%, HER2 negative   06/11/2021 Cancer Staging   Staging form: Breast, AJCC 8th Edition - Clinical stage from 06/11/2021: Stage IIB (cT2, cN0, cM0, G3, ER-, PR-, HER2-) - Signed by Nicholas Lose, MD on 06/11/2021 Stage prefix: Initial diagnosis Histologic grading system: 3 grade system   06/19/2021 -  Chemotherapy   Patient is on Treatment Plan : BREAST TC q21d       CHIEF COMPLIANT: Cycle 4 TC  INTERVAL HISTORY: Sabrina Wright is a 60 y.o. with above-mentioned history of right breast cancer currently on chemotherapy with Taxotere and Cytoxan. She presents to the clinic today for treatment.  She is complaining of bone pain for 4 days after chemotherapy which gets better with tramadol.  She ran out of tramadol.  Denies any nausea or vomiting.  She has itchiness on the palms of her hands as well as bottom of her feet.  ALLERGIES:  is allergic to  codeine, eggs or egg-derived products, and gadavist [gadobutrol].  MEDICATIONS:  Current Outpatient Medications  Medication Sig Dispense Refill   acetaminophen (TYLENOL) 325 MG tablet Take 2 tablets (650 mg total) by mouth every 6 (six) hours as needed for fever, headache or moderate pain.     ALPRAZolam (XANAX) 0.5 MG tablet Take 0.5-1 tablets (0.25-0.5 mg total) by mouth at bedtime. 30 tablet 1   Calcium Carb-Cholecalciferol (CALCIUM 600/VITAMIN D3) 600-800 MG-UNIT TABS Take 2 tablets by mouth daily. 60 tablet    Cholecalciferol (VITAMIN D) 50 MCG (2000 UT) CAPS Take 1 capsule (2,000 Units total) by mouth daily. 30 capsule    citalopram (CELEXA) 20 MG tablet TAKE 1 TABLET BY MOUTH EVERY DAY 90 tablet 0   clobetasol cream (TEMOVATE) 2.62 % APPLY 1 APPLICATION TOPICALLY 2 (TWO) TIMES DAILY. APPLY TO AFFECTED AREA NO MORE THAN 2 WEEKS AT A TIME+ (Patient taking differently: Apply 1 application topically 2 (two) times daily as needed (rash). APPLY TO AFFECTED AREA NO MORE THAN 2 WEEKS AT A TIME) 30 g 0   denosumab (PROLIA) 60 MG/ML SOSY injection INJECT 60MG SUBCUTANEOUSLY  EVERY 6 MONTHS (GIVEN AT  PRESCRIBERS OFFICE) (Patient taking differently: Inject 60 mg into the skin every 6 (six) months. INJECT 60MG SUBCUTANEOUSLY  EVERY 6 MONTHS (GIVEN AT  PRESCRIBERS OFFICE)) 1 mL 1   dexamethasone (DECADRON) 4 MG tablet Take 1 tablet (4 mg total) by mouth daily. Start the day before Taxotere. Then again the day  after chemo with food 12 tablet 0   lidocaine-prilocaine (EMLA) cream Apply to affected area once (Patient not taking: Reported on 06/25/2021) 30 g 3   loratadine (CLARITIN) 10 MG tablet Take 10 mg by mouth as needed for allergies.     meloxicam (MOBIC) 7.5 MG tablet TAKE 1 TABLET BY MOUTH EVERY DAY 30 tablet 6   Multiple Vitamin (MULTIVITAMIN WITH MINERALS) TABS tablet Take 1 tablet by mouth at bedtime.     ondansetron (ZOFRAN) 8 MG tablet Take 1 tablet (8 mg total) by mouth 2 (two) times daily as  needed for refractory nausea / vomiting. Start on day 3 after chemo. (Patient not taking: Reported on 06/25/2021) 30 tablet 1   prochlorperazine (COMPAZINE) 10 MG tablet Take 1 tablet (10 mg total) by mouth every 6 (six) hours as needed (Nausea or vomiting). (Patient not taking: Reported on 06/25/2021) 30 tablet 1   SUMAtriptan (IMITREX) 100 MG tablet TAKE 1 TABLET BY MOUTH ONCE FOR 1 DOSE. MAY REPEAT IN 2 HOURS IF HEADACHE PERSISTS OR RECURS. 9 tablet 3   traMADol (ULTRAM) 50 MG tablet Take 1 tablet (50 mg total) by mouth every 6 (six) hours as needed. 30 tablet 0   vitamin B-12 (V-R VITAMIN B-12) 500 MCG tablet Take 1 tablet (500 mcg total) by mouth daily.     No current facility-administered medications for this visit.    PHYSICAL EXAMINATION: ECOG PERFORMANCE STATUS: 1 - Symptomatic but completely ambulatory  Vitals:   08/21/21 1052  BP: 124/71  Pulse: 96  Resp: 18  Temp: 97.7 F (36.5 C)  SpO2: 97%   Filed Weights   08/21/21 1052  Weight: 173 lb (78.5 kg)    LABORATORY DATA:  I have reviewed the data as listed CMP Latest Ref Rng & Units 07/31/2021 07/10/2021 06/26/2021  Glucose 70 - 99 mg/dL 81 89 91  BUN 6 - 20 mg/dL '11 17 13  ' Creatinine 0.44 - 1.00 mg/dL 0.65 0.71 0.72  Sodium 135 - 145 mmol/L 138 139 136  Potassium 3.5 - 5.1 mmol/L 3.9 4.1 3.8  Chloride 98 - 111 mmol/L 105 105 99  CO2 22 - 32 mmol/L '24 24 25  ' Calcium 8.9 - 10.3 mg/dL 9.4 10.0 9.3  Total Protein 6.5 - 8.1 g/dL 6.8 7.2 6.5  Total Bilirubin 0.3 - 1.2 mg/dL 0.2(L) 0.4 0.4  Alkaline Phos 38 - 126 U/L 64 70 67  AST 15 - 41 U/L '21 17 25  ' ALT 0 - 44 U/L '18 14 19    ' Lab Results  Component Value Date   WBC 17.9 (H) 08/21/2021   HGB 11.9 (L) 08/21/2021   HCT 34.8 (L) 08/21/2021   MCV 85.3 08/21/2021   PLT 439 (H) 08/21/2021   NEUTROABS 16.7 (H) 08/21/2021    ASSESSMENT & PLAN:  Malignant neoplasm of upper-inner quadrant of right breast in female, estrogen receptor negative ([place]) History of left breast  cancer status postlumpectomy 2003 status post chemo with Adriamycin, radiation 06/05/2021: Pain in the right breast: Mammogram and ultrasound revealed indeterminate masses in the right breast spanning 2.4 cm biopsy revealed grade 3 IDC ER 5%, PR 0%, 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  Neoadjuvant chemotherapy with Taxotere and Cytoxan x4 2. breast conserving surgery with sentinel lymph node biopsy 3.  Adjuvant radiation therapy 4.  Consideration for adjuvant antiestrogen therapy with a 5% ER positivity. ----------------------------------------------------------------------------------------------------------------------- Current Treatment: Cycle 4 Taxotere Cytoxan.(started 06/19/2021) Chemo Toxicities: Severe bone pain from Neulasta: Given without Neulasta,  her blood counts have remained stable therefore we do not need to administer Neulasta again. Fatigue: Continues to be a problem.  It could be related to chemo but also compounded by underlying emotional issues.   Denies any nausea or vomiting. Severe anxiety: on Xanax. Breast MRI will need to be scheduled after that an appointment with surgery Pain after chemotherapy: I renewed her prescription for tramadol.  She takes tramadol for 4 days to alleviate the pain related to chemotherapy.  Return to clinic after surgery to discuss final pathology report.    No orders of the defined types were placed in this encounter.  The patient has a good understanding of the overall plan. she agrees with it. she will call with any problems that may develop before the next visit here.  Total time spent: 30 mins including face to face time and time spent for planning, charting and coordination of care  Rulon Eisenmenger, MD, MPH 08/21/2021  I, Thana Ates, am acting as scribe for Dr. Nicholas Lose.  I have reviewed the above documentation for accuracy and completeness, and I agree with the above.

## 2021-08-21 ENCOUNTER — Inpatient Hospital Stay: Payer: BC Managed Care – PPO

## 2021-08-21 ENCOUNTER — Other Ambulatory Visit: Payer: Self-pay

## 2021-08-21 ENCOUNTER — Inpatient Hospital Stay: Payer: BC Managed Care – PPO | Attending: Hematology and Oncology

## 2021-08-21 ENCOUNTER — Inpatient Hospital Stay (HOSPITAL_BASED_OUTPATIENT_CLINIC_OR_DEPARTMENT_OTHER): Payer: BC Managed Care – PPO | Admitting: Hematology and Oncology

## 2021-08-21 ENCOUNTER — Encounter: Payer: Self-pay | Admitting: *Deleted

## 2021-08-21 DIAGNOSIS — M898X0 Other specified disorders of bone, multiple sites: Secondary | ICD-10-CM | POA: Diagnosis not present

## 2021-08-21 DIAGNOSIS — Z5111 Encounter for antineoplastic chemotherapy: Secondary | ICD-10-CM | POA: Diagnosis not present

## 2021-08-21 DIAGNOSIS — Z79899 Other long term (current) drug therapy: Secondary | ICD-10-CM | POA: Insufficient documentation

## 2021-08-21 DIAGNOSIS — Z95828 Presence of other vascular implants and grafts: Secondary | ICD-10-CM

## 2021-08-21 DIAGNOSIS — Z171 Estrogen receptor negative status [ER-]: Secondary | ICD-10-CM | POA: Insufficient documentation

## 2021-08-21 DIAGNOSIS — C50211 Malignant neoplasm of upper-inner quadrant of right female breast: Secondary | ICD-10-CM

## 2021-08-21 DIAGNOSIS — R5383 Other fatigue: Secondary | ICD-10-CM | POA: Diagnosis not present

## 2021-08-21 LAB — CBC WITH DIFFERENTIAL/PLATELET
Abs Immature Granulocytes: 0.13 10*3/uL — ABNORMAL HIGH (ref 0.00–0.07)
Basophils Absolute: 0 10*3/uL (ref 0.0–0.1)
Basophils Relative: 0 %
Eosinophils Absolute: 0 10*3/uL (ref 0.0–0.5)
Eosinophils Relative: 0 %
HCT: 34.8 % — ABNORMAL LOW (ref 36.0–46.0)
Hemoglobin: 11.9 g/dL — ABNORMAL LOW (ref 12.0–15.0)
Immature Granulocytes: 1 %
Lymphocytes Relative: 3 %
Lymphs Abs: 0.6 10*3/uL — ABNORMAL LOW (ref 0.7–4.0)
MCH: 29.2 pg (ref 26.0–34.0)
MCHC: 34.2 g/dL (ref 30.0–36.0)
MCV: 85.3 fL (ref 80.0–100.0)
Monocytes Absolute: 0.4 10*3/uL (ref 0.1–1.0)
Monocytes Relative: 3 %
Neutro Abs: 16.7 10*3/uL — ABNORMAL HIGH (ref 1.7–7.7)
Neutrophils Relative %: 93 %
Platelets: 439 10*3/uL — ABNORMAL HIGH (ref 150–400)
RBC: 4.08 MIL/uL (ref 3.87–5.11)
RDW: 16.5 % — ABNORMAL HIGH (ref 11.5–15.5)
WBC: 17.9 10*3/uL — ABNORMAL HIGH (ref 4.0–10.5)
nRBC: 0 % (ref 0.0–0.2)

## 2021-08-21 LAB — COMPREHENSIVE METABOLIC PANEL
ALT: 22 U/L (ref 0–44)
AST: 24 U/L (ref 15–41)
Albumin: 3.8 g/dL (ref 3.5–5.0)
Alkaline Phosphatase: 65 U/L (ref 38–126)
Anion gap: 13 (ref 5–15)
BUN: 11 mg/dL (ref 6–20)
CO2: 21 mmol/L — ABNORMAL LOW (ref 22–32)
Calcium: 9.7 mg/dL (ref 8.9–10.3)
Chloride: 103 mmol/L (ref 98–111)
Creatinine, Ser: 0.76 mg/dL (ref 0.44–1.00)
GFR, Estimated: >60 mL/min (ref >60)
Glucose, Bld: 145 mg/dL — ABNORMAL HIGH (ref 70–99)
Potassium: 4.1 mmol/L (ref 3.5–5.1)
Sodium: 137 mmol/L (ref 135–145)
Total Bilirubin: 0.3 mg/dL (ref 0.3–1.2)
Total Protein: 7.2 g/dL (ref 6.5–8.1)

## 2021-08-21 MED ORDER — SODIUM CHLORIDE 0.9 % IV SOLN
150.0000 mg | Freq: Once | INTRAVENOUS | Status: AC
  Administered 2021-08-21: 150 mg via INTRAVENOUS
  Filled 2021-08-21: qty 150

## 2021-08-21 MED ORDER — SODIUM CHLORIDE 0.9 % IV SOLN
10.0000 mg | Freq: Once | INTRAVENOUS | Status: AC
  Administered 2021-08-21: 10 mg via INTRAVENOUS
  Filled 2021-08-21: qty 10

## 2021-08-21 MED ORDER — SODIUM CHLORIDE 0.9 % IV SOLN
600.0000 mg/m2 | Freq: Once | INTRAVENOUS | Status: AC
  Administered 2021-08-21: 1120 mg via INTRAVENOUS
  Filled 2021-08-21: qty 56

## 2021-08-21 MED ORDER — SODIUM CHLORIDE 0.9% FLUSH
10.0000 mL | INTRAVENOUS | Status: DC | PRN
  Administered 2021-08-21: 10 mL

## 2021-08-21 MED ORDER — SODIUM CHLORIDE 0.9% FLUSH
10.0000 mL | Freq: Once | INTRAVENOUS | Status: AC
  Administered 2021-08-21: 10 mL

## 2021-08-21 MED ORDER — SODIUM CHLORIDE 0.9 % IV SOLN: Freq: Once | INTRAVENOUS | Status: AC

## 2021-08-21 MED ORDER — HEPARIN SOD (PORK) LOCK FLUSH 100 UNIT/ML IV SOLN
500.0000 [IU] | Freq: Once | INTRAVENOUS | Status: AC | PRN
  Administered 2021-08-21: 500 [IU]

## 2021-08-21 MED ORDER — SODIUM CHLORIDE 0.9 % IV SOLN
60.0000 mg/m2 | Freq: Once | INTRAVENOUS | Status: AC
  Administered 2021-08-21: 110 mg via INTRAVENOUS
  Filled 2021-08-21: qty 11

## 2021-08-21 MED ORDER — TRAMADOL HCL 50 MG PO TABS: 50.0000 mg | ORAL_TABLET | Freq: Four times a day (QID) | ORAL | 0 refills | Status: DC | PRN

## 2021-08-21 MED ORDER — PALONOSETRON HCL INJECTION 0.25 MG/5ML
0.2500 mg | Freq: Once | INTRAVENOUS | Status: AC
  Administered 2021-08-21: 0.25 mg via INTRAVENOUS
  Filled 2021-08-21: qty 5

## 2021-08-21 NOTE — Assessment & Plan Note (Signed)
History of left breast cancer status postlumpectomy 2003status post chemo with Adriamycin, radiation 06/05/2021:Pain in the right breast: Mammogram and ultrasound revealed indeterminate masses in the right breast spanning 2.4 cm biopsy revealed grade 3 IDC ER 5%, PR 0%, Ki67 60%, HER2 negative  Treatment plan: 1.Neoadjuvant chemotherapy with Taxotereand Cytoxanx4 2.breast conserving surgery with sentinel lymph node biopsy 3.Adjuvant radiation therapy 4.Consideration for adjuvant antiestrogen therapy with a 5% ER positivity. ----------------------------------------------------------------------------------------------------------------------- Current Treatment: Cycle4Taxotere Cytoxan.(started8/16/2022) Chemo Toxicities: 1. Severe bone pain from Neulasta:Given without Neulasta, her blood counts have remained stable therefore we do not need to administer Neulasta again. 2. Fatigue: Continues to be a problem.  It could be related to chemo but also compounded by underlying emotional issues.  Denies any nausea or vomiting. Severe anxiety:onXanax. Breast MRI will need to be scheduled after that an appointment with surgery   Return to clinic after surgery to discuss final pathology report.

## 2021-08-21 NOTE — Patient Instructions (Addendum)
[facility]   Happy Last Treatment Day! Discharge Instructions: Thank you for choosing [facility] to provide your oncology and hematology care.   If you have a lab appointment with the [facility], please go directly to the [facility] and check in at the registration area.   Wear comfortable clothing and clothing appropriate for easy access to any Portacath or PICC line.   We strive to give you quality time with your provider. You may need to reschedule your appointment if you arrive late (15 or more minutes).  Arriving late affects you and other patients whose appointments are after yours.  Also, if you miss three or more appointments without notifying the office, you may be dismissed from the clinic at the provider's discretion.      For prescription refill requests, have your pharmacy contact our office and allow 72 hours for refills to be completed.    Today you received the following chemotherapy and/or immunotherapy agents: Taxotere & Cytoxan   To help prevent nausea and vomiting after your treatment, we encourage you to take your nausea medication as directed.  BELOW ARE SYMPTOMS THAT SHOULD BE REPORTED IMMEDIATELY: *FEVER GREATER THAN 100.4 F (38 C) OR HIGHER *CHILLS OR SWEATING *NAUSEA AND VOMITING THAT IS NOT CONTROLLED WITH YOUR NAUSEA MEDICATION *UNUSUAL SHORTNESS OF BREATH *UNUSUAL BRUISING OR BLEEDING *URINARY PROBLEMS (pain or burning when urinating, or frequent urination) *BOWEL PROBLEMS (unusual diarrhea, constipation, pain near the anus) TENDERNESS IN MOUTH AND THROAT WITH OR WITHOUT PRESENCE OF ULCERS (sore throat, sores in mouth, or a toothache) UNUSUAL RASH, SWELLING OR PAIN  UNUSUAL VAGINAL DISCHARGE OR ITCHING   Items with * indicate a potential emergency and should be followed up as soon as possible or go to the Emergency Department if any problems should occur.  Please show the CHEMOTHERAPY ALERT CARD or  IMMUNOTHERAPY ALERT CARD at check-in to the Emergency Department and triage nurse.  Should you have questions after your visit or need to cancel or reschedule your appointment, please contact [facility]  Dept: [phone]  and follow the prompts.  Office hours are 8:00 a.m. to 4:30 p.m. Monday - Friday. Please note that voicemails left after 4:00 p.m. may not be returned until the following business day.  We are closed weekends and major holidays. You have access to a nurse at all times for urgent questions. Please call the main number to the clinic Dept: [phone] and follow the prompts.   For any non-urgent questions, you may also contact your provider using MyChart. We now offer e-Visits for anyone 86 and older to request care online for non-urgent symptoms. For details visit [email or web address].   Also download the MyChart app! Go to the app store, search "MyChart", open the app, select [facility], and log in with your MyChart username and password.  Due to Covid, a mask is required upon entering the hospital/clinic. If you do not have a mask, one will be given to you upon arrival. For doctor visits, patients may have 1 support person aged 73 or older with them. For treatment visits, patients cannot have anyone with them due to current Covid guidelines and our immunocompromised population.

## 2021-08-22 ENCOUNTER — Ambulatory Visit
Admission: RE | Admit: 2021-08-22 | Discharge: 2021-08-22 | Disposition: A | Discharge status: Home / Self Care | Payer: BC Managed Care – PPO | Source: Ambulatory Visit | Attending: Hematology and Oncology | Admitting: Hematology and Oncology

## 2021-08-22 DIAGNOSIS — Z171 Estrogen receptor negative status [ER-]: Secondary | ICD-10-CM

## 2021-08-22 DIAGNOSIS — R928 Other abnormal and inconclusive findings on diagnostic imaging of breast: Secondary | ICD-10-CM | POA: Diagnosis not present

## 2021-08-22 MED ORDER — GADOBUTROL 1 MMOL/ML IV SOLN
8.0000 mL | Freq: Once | INTRAVENOUS | Status: AC | PRN
  Administered 2021-08-22: 8 mL via INTRAVENOUS

## 2021-08-23 ENCOUNTER — Encounter: Payer: Self-pay | Admitting: *Deleted

## 2021-08-23 ENCOUNTER — Ambulatory Visit: Payer: BC Managed Care – PPO

## 2021-09-04 ENCOUNTER — Ambulatory Visit: Payer: Self-pay | Admitting: General Surgery

## 2021-09-04 ENCOUNTER — Telehealth: Payer: Self-pay | Admitting: *Deleted

## 2021-09-04 ENCOUNTER — Other Ambulatory Visit: Payer: Self-pay | Admitting: General Surgery

## 2021-09-04 DIAGNOSIS — C50211 Malignant neoplasm of upper-inner quadrant of right female breast: Secondary | ICD-10-CM

## 2021-09-04 DIAGNOSIS — Z171 Estrogen receptor negative status [ER-]: Secondary | ICD-10-CM

## 2021-09-04 DIAGNOSIS — C50411 Malignant neoplasm of upper-outer quadrant of right female breast: Secondary | ICD-10-CM | POA: Diagnosis not present

## 2021-09-04 NOTE — Telephone Encounter (Signed)
08/29/2021:   [employer] HCP Detailed Medical request for KAMEO BAINS, claim: [id number], reference no. [id number] received to Dr. Marlou Starks. Request forwarded via fax [phone]) to [facility].    09/04/2021:  No new request received.  Message left with above info for Avanell Shackleton, [place] [phone], ext. [phone]).  Return number [phone]) and fax number ([phone]) also provided for questions or needs of supporting document requests from oncologist.

## 2021-09-06 ENCOUNTER — Encounter: Payer: Self-pay | Admitting: *Deleted

## 2021-09-06 DIAGNOSIS — C50211 Malignant neoplasm of upper-inner quadrant of right female breast: Secondary | ICD-10-CM

## 2021-09-07 ENCOUNTER — Other Ambulatory Visit: Payer: Self-pay | Admitting: Family Medicine

## 2021-09-07 NOTE — Telephone Encounter (Signed)
Sumatriptan Last filled:  08/07/21, #9 Last OV:  05/22/21, L breast pain Next OV:  none

## 2021-09-14 ENCOUNTER — Encounter: Payer: Self-pay | Admitting: *Deleted

## 2021-09-25 ENCOUNTER — Telehealth: Payer: Self-pay | Admitting: General Practice

## 2021-09-25 ENCOUNTER — Encounter: Payer: Self-pay | Admitting: General Practice

## 2021-09-25 NOTE — Telephone Encounter (Signed)
[facility] CSW Progress Notes  Request from K [place] to reach out to patient re gas card.  Spoke w her, she is concerned about multiple trips to/from [facility] and other facilities for appointments.  She has had one gas card from [facility] in August 2022.  She can pick up another card when she is at [facility] on 12/13 for appointment with Dr Lindi Adie - she should come to [facility].   Will mail patient copies of breast cancer fund applications.  Suggested she contact L White, her Estate manager/land agent, Administrator, sports.  Also alerted [facility] team that patient may request [facility] card on her visit on 12/13 - undersigned CSW will not be at [facility] that day.  Edwyna Shell, LCSW Clinical Social Worker Phone:  [phone]

## 2021-09-26 ENCOUNTER — Encounter (HOSPITAL_BASED_OUTPATIENT_CLINIC_OR_DEPARTMENT_OTHER): Payer: Self-pay | Admitting: General Surgery

## 2021-10-01 NOTE — Progress Notes (Signed)

## 2021-10-04 ENCOUNTER — Ambulatory Visit
Admission: RE | Admit: 2021-10-04 | Discharge: 2021-10-04 | Disposition: A | Discharge status: Home / Self Care | Payer: BC Managed Care – PPO | Source: Ambulatory Visit | Attending: General Surgery | Admitting: General Surgery

## 2021-10-04 DIAGNOSIS — C50211 Malignant neoplasm of upper-inner quadrant of right female breast: Secondary | ICD-10-CM

## 2021-10-04 DIAGNOSIS — C50911 Malignant neoplasm of unspecified site of right female breast: Secondary | ICD-10-CM | POA: Diagnosis not present

## 2021-10-05 ENCOUNTER — Ambulatory Visit
Admission: RE | Admit: 2021-10-05 | Discharge: 2021-10-05 | Disposition: A | Discharge status: Home / Self Care | Payer: BC Managed Care – PPO | Source: Ambulatory Visit | Attending: General Surgery | Admitting: General Surgery

## 2021-10-05 ENCOUNTER — Other Ambulatory Visit: Payer: Self-pay

## 2021-10-05 ENCOUNTER — Ambulatory Visit (HOSPITAL_BASED_OUTPATIENT_CLINIC_OR_DEPARTMENT_OTHER): Payer: BC Managed Care – PPO | Admitting: Anesthesiology

## 2021-10-05 ENCOUNTER — Ambulatory Visit (HOSPITAL_BASED_OUTPATIENT_CLINIC_OR_DEPARTMENT_OTHER)
Admission: RE | Admit: 2021-10-05 | Discharge: 2021-10-05 | Disposition: A | Discharge status: Home / Self Care | Payer: BC Managed Care – PPO | Source: Ambulatory Visit | Attending: General Surgery | Admitting: General Surgery

## 2021-10-05 ENCOUNTER — Encounter (HOSPITAL_BASED_OUTPATIENT_CLINIC_OR_DEPARTMENT_OTHER)
Admission: RE | Disposition: A | Discharge status: Home / Self Care | Payer: Self-pay | Source: Ambulatory Visit | Attending: General Surgery

## 2021-10-05 ENCOUNTER — Encounter (HOSPITAL_BASED_OUTPATIENT_CLINIC_OR_DEPARTMENT_OTHER): Payer: Self-pay | Admitting: General Surgery

## 2021-10-05 DIAGNOSIS — C50411 Malignant neoplasm of upper-outer quadrant of right female breast: Secondary | ICD-10-CM | POA: Insufficient documentation

## 2021-10-05 DIAGNOSIS — Z171 Estrogen receptor negative status [ER-]: Secondary | ICD-10-CM | POA: Diagnosis not present

## 2021-10-05 DIAGNOSIS — C50911 Malignant neoplasm of unspecified site of right female breast: Secondary | ICD-10-CM | POA: Diagnosis not present

## 2021-10-05 DIAGNOSIS — C50211 Malignant neoplasm of upper-inner quadrant of right female breast: Secondary | ICD-10-CM

## 2021-10-05 DIAGNOSIS — F17200 Nicotine dependence, unspecified, uncomplicated: Secondary | ICD-10-CM | POA: Diagnosis not present

## 2021-10-05 DIAGNOSIS — M810 Age-related osteoporosis without current pathological fracture: Secondary | ICD-10-CM | POA: Diagnosis not present

## 2021-10-05 DIAGNOSIS — N6031 Fibrosclerosis of right breast: Secondary | ICD-10-CM | POA: Insufficient documentation

## 2021-10-05 DIAGNOSIS — G8918 Other acute postprocedural pain: Secondary | ICD-10-CM | POA: Diagnosis not present

## 2021-10-05 HISTORY — PX: BREAST LUMPECTOMY WITH RADIOACTIVE SEED AND SENTINEL LYMPH NODE BIOPSY: SHX6550

## 2021-10-05 SURGERY — BREAST LUMPECTOMY WITH RADIOACTIVE SEED AND SENTINEL LYMPH NODE BIOPSY
Anesthesia: General | Site: Breast | Laterality: Right

## 2021-10-05 MED ORDER — BUPIVACAINE-EPINEPHRINE (PF) 0.25% -1:200000 IJ SOLN
INTRAMUSCULAR | Status: AC
  Filled 2021-10-05: qty 30

## 2021-10-05 MED ORDER — MAGTRACE LYMPHATIC TRACER
INTRAMUSCULAR | Status: DC | PRN
  Administered 2021-10-05: 3 mL via INTRAMUSCULAR

## 2021-10-05 MED ORDER — TRAMADOL HCL 50 MG PO TABS: 50.0000 mg | ORAL_TABLET | Freq: Four times a day (QID) | ORAL | 0 refills | Status: DC | PRN

## 2021-10-05 MED ORDER — LIDOCAINE 2% (20 MG/ML) 5 ML SYRINGE
INTRAMUSCULAR | Status: AC
  Filled 2021-10-05: qty 5

## 2021-10-05 MED ORDER — ACETAMINOPHEN 500 MG PO TABS
ORAL_TABLET | ORAL | Status: AC
  Filled 2021-10-05: qty 2

## 2021-10-05 MED ORDER — FENTANYL CITRATE (PF) 100 MCG/2ML IJ SOLN
INTRAMUSCULAR | Status: AC
  Filled 2021-10-05: qty 2

## 2021-10-05 MED ORDER — EPHEDRINE SULFATE 50 MG/ML IJ SOLN
INTRAMUSCULAR | Status: DC | PRN
  Administered 2021-10-05: 10 mg via INTRAVENOUS
  Administered 2021-10-05 (×2): 7 mg via INTRAVENOUS
  Administered 2021-10-05 (×2): 5 mg via INTRAVENOUS

## 2021-10-05 MED ORDER — CELECOXIB 200 MG PO CAPS
ORAL_CAPSULE | ORAL | Status: AC
  Filled 2021-10-05: qty 1

## 2021-10-05 MED ORDER — HYDROCODONE-ACETAMINOPHEN 5-325 MG PO TABS: 1.0000 | ORAL_TABLET | Freq: Four times a day (QID) | ORAL | 0 refills | Status: DC | PRN

## 2021-10-05 MED ORDER — FENTANYL CITRATE (PF) 100 MCG/2ML IJ SOLN
100.0000 ug | Freq: Once | INTRAMUSCULAR | Status: AC
  Administered 2021-10-05: 50 ug via INTRAVENOUS

## 2021-10-05 MED ORDER — CHLORHEXIDINE GLUCONATE CLOTH 2 % EX PADS: 6.0000 | MEDICATED_PAD | Freq: Once | CUTANEOUS | Status: DC

## 2021-10-05 MED ORDER — PHENYLEPHRINE HCL (PRESSORS) 10 MG/ML IV SOLN
INTRAVENOUS | Status: AC
  Filled 2021-10-05: qty 1

## 2021-10-05 MED ORDER — DEXAMETHASONE SODIUM PHOSPHATE 10 MG/ML IJ SOLN
INTRAMUSCULAR | Status: AC
  Filled 2021-10-05: qty 1

## 2021-10-05 MED ORDER — LIDOCAINE HCL 1 % IJ SOLN
INTRAMUSCULAR | Status: DC | PRN
  Administered 2021-10-05: 80 mg via INTRADERMAL

## 2021-10-05 MED ORDER — CEFAZOLIN SODIUM-DEXTROSE 2-4 GM/100ML-% IV SOLN
INTRAVENOUS | Status: AC
  Filled 2021-10-05: qty 100

## 2021-10-05 MED ORDER — GABAPENTIN 300 MG PO CAPS
ORAL_CAPSULE | ORAL | Status: AC
  Filled 2021-10-05: qty 1

## 2021-10-05 MED ORDER — FENTANYL CITRATE (PF) 100 MCG/2ML IJ SOLN
INTRAMUSCULAR | Status: DC | PRN
  Administered 2021-10-05 (×2): 25 ug via INTRAVENOUS
  Administered 2021-10-05: 50 ug via INTRAVENOUS

## 2021-10-05 MED ORDER — MIDAZOLAM HCL 2 MG/2ML IJ SOLN
2.0000 mg | Freq: Once | INTRAMUSCULAR | Status: AC
  Administered 2021-10-05: 2 mg via INTRAVENOUS

## 2021-10-05 MED ORDER — FENTANYL CITRATE (PF) 100 MCG/2ML IJ SOLN
25.0000 ug | INTRAMUSCULAR | Status: DC | PRN
  Administered 2021-10-05 (×3): 50 ug via INTRAVENOUS

## 2021-10-05 MED ORDER — MIDAZOLAM HCL 5 MG/5ML IJ SOLN
INTRAMUSCULAR | Status: DC | PRN
  Administered 2021-10-05: 1 mg via INTRAVENOUS

## 2021-10-05 MED ORDER — CEFAZOLIN SODIUM-DEXTROSE 2-4 GM/100ML-% IV SOLN
2.0000 g | INTRAVENOUS | Status: AC
  Administered 2021-10-05: 2 g via INTRAVENOUS

## 2021-10-05 MED ORDER — ACETAMINOPHEN 500 MG PO TABS
1000.0000 mg | ORAL_TABLET | ORAL | Status: AC
  Administered 2021-10-05: 1000 mg via ORAL

## 2021-10-05 MED ORDER — DEXAMETHASONE SODIUM PHOSPHATE 10 MG/ML IJ SOLN
INTRAMUSCULAR | Status: DC | PRN
  Administered 2021-10-05: 10 mg via INTRAVENOUS

## 2021-10-05 MED ORDER — PROPOFOL 10 MG/ML IV BOLUS
INTRAVENOUS | Status: DC | PRN
  Administered 2021-10-05: 150 mg via INTRAVENOUS

## 2021-10-05 MED ORDER — ONDANSETRON HCL 4 MG/2ML IJ SOLN
INTRAMUSCULAR | Status: DC | PRN
  Administered 2021-10-05: 4 mg via INTRAVENOUS

## 2021-10-05 MED ORDER — METHYLENE BLUE 0.5 % INJ SOLN
INTRAVENOUS | Status: AC
  Filled 2021-10-05: qty 10

## 2021-10-05 MED ORDER — CELECOXIB 200 MG PO CAPS
200.0000 mg | ORAL_CAPSULE | ORAL | Status: AC
  Administered 2021-10-05: 200 mg via ORAL

## 2021-10-05 MED ORDER — MIDAZOLAM HCL 2 MG/2ML IJ SOLN
INTRAMUSCULAR | Status: AC
  Filled 2021-10-05: qty 2

## 2021-10-05 MED ORDER — SODIUM CHLORIDE (PF) 0.9 % IJ SOLN
INTRAMUSCULAR | Status: AC
  Filled 2021-10-05: qty 10

## 2021-10-05 MED ORDER — LACTATED RINGERS IV SOLN: INTRAVENOUS | Status: DC

## 2021-10-05 MED ORDER — BUPIVACAINE-EPINEPHRINE 0.25% -1:200000 IJ SOLN
INTRAMUSCULAR | Status: DC | PRN
  Administered 2021-10-05: 10 mL
  Administered 2021-10-05: 15 mL

## 2021-10-05 MED ORDER — PROPOFOL 10 MG/ML IV BOLUS
INTRAVENOUS | Status: AC
  Filled 2021-10-05: qty 20

## 2021-10-05 MED ORDER — 0.9 % SODIUM CHLORIDE (POUR BTL) OPTIME
TOPICAL | Status: DC | PRN
  Administered 2021-10-05: 300 mL

## 2021-10-05 MED ORDER — PHENYLEPHRINE HCL-NACL 20-0.9 MG/250ML-% IV SOLN
INTRAVENOUS | Status: DC | PRN
  Administered 2021-10-05: 30 ug/min via INTRAVENOUS

## 2021-10-05 MED ORDER — GABAPENTIN 300 MG PO CAPS
300.0000 mg | ORAL_CAPSULE | ORAL | Status: AC
  Administered 2021-10-05: 300 mg via ORAL

## 2021-10-05 MED ORDER — EPHEDRINE 5 MG/ML INJ
INTRAVENOUS | Status: AC
  Filled 2021-10-05: qty 10

## 2021-10-05 SURGICAL SUPPLY — 48 items
ADH SKN CLS APL DERMABOND .7 (GAUZE/BANDAGES/DRESSINGS) ×1
APL PRP STRL LF DISP 70% ISPRP (MISCELLANEOUS) ×1
APPLIER CLIP 9.375 MED OPEN (MISCELLANEOUS) ×4
APR CLP MED 9.3 20 MLT OPN (MISCELLANEOUS) ×2
BLADE SURG 15 STRL LF DISP TIS (BLADE) ×1 IMPLANT
BLADE SURG 15 STRL SS (BLADE) ×2
CANISTER SUC SOCK COL 7IN (MISCELLANEOUS) IMPLANT
CANISTER SUCT 1200ML W/VALVE (MISCELLANEOUS) IMPLANT
CHLORAPREP W/TINT 26 (MISCELLANEOUS) ×2 IMPLANT
CLIP APPLIE 9.375 MED OPEN (MISCELLANEOUS) ×2 IMPLANT
COVER BACK TABLE 60X90IN (DRAPES) ×2 IMPLANT
COVER MAYO STAND STRL (DRAPES) ×2 IMPLANT
COVER PROBE W GEL 5X96 (DRAPES) ×2 IMPLANT
DECANTER SPIKE VIAL GLASS SM (MISCELLANEOUS) IMPLANT
DERMABOND ADVANCED (GAUZE/BANDAGES/DRESSINGS) ×1
DERMABOND ADVANCED .7 DNX12 (GAUZE/BANDAGES/DRESSINGS) ×1 IMPLANT
DRAPE LAPAROSCOPIC ABDOMINAL (DRAPES) ×2 IMPLANT
DRAPE UTILITY XL STRL (DRAPES) ×2 IMPLANT
ELECT COATED BLADE 2.86 ST (ELECTRODE) ×2 IMPLANT
ELECT REM PT RETURN 9FT ADLT (ELECTROSURGICAL) ×2
ELECTRODE REM PT RTRN 9FT ADLT (ELECTROSURGICAL) ×1 IMPLANT
GLOVE SURG ENC MOIS LTX SZ7.5 (GLOVE) ×2 IMPLANT
GLOVE SURG POLYISO LF SZ6.5 (GLOVE) ×2 IMPLANT
GLOVE SURG UNDER POLY LF SZ7 (GLOVE) ×2 IMPLANT
GOWN STRL REUS W/ TWL LRG LVL3 (GOWN DISPOSABLE) ×2 IMPLANT
GOWN STRL REUS W/ TWL XL LVL3 (GOWN DISPOSABLE) ×1 IMPLANT
GOWN STRL REUS W/TWL LRG LVL3 (GOWN DISPOSABLE) ×4
GOWN STRL REUS W/TWL XL LVL3 (GOWN DISPOSABLE) ×2
ILLUMINATOR WAVEGUIDE N/F (MISCELLANEOUS) IMPLANT
KIT MARKER MARGIN INK (KITS) ×2 IMPLANT
LIGHT WAVEGUIDE WIDE FLAT (MISCELLANEOUS) IMPLANT
NDL SAFETY ECLIPSE 18X1.5 (NEEDLE) IMPLANT
NEEDLE HYPO 18GX1.5 SHARP (NEEDLE)
NEEDLE HYPO 25X1 1.5 SAFETY (NEEDLE) ×2 IMPLANT
NS IRRIG 1000ML POUR BTL (IV SOLUTION) ×2 IMPLANT
PACK BASIN DAY SURGERY FS (CUSTOM PROCEDURE TRAY) ×2 IMPLANT
PENCIL SMOKE EVACUATOR (MISCELLANEOUS) ×2 IMPLANT
SLEEVE SCD COMPRESS KNEE MED (STOCKING) ×2 IMPLANT
SPONGE T-LAP 18X18 ~~LOC~~+RFID (SPONGE) ×4 IMPLANT
SUT MON AB 4-0 PC3 18 (SUTURE) ×4 IMPLANT
SUT SILK 2 0 SH (SUTURE) IMPLANT
SUT VICRYL 3-0 CR8 SH (SUTURE) ×2 IMPLANT
SYR CONTROL 10ML LL (SYRINGE) ×2 IMPLANT
TOWEL GREEN STERILE FF (TOWEL DISPOSABLE) ×2 IMPLANT
TRACER MAGTRACE VIAL (MISCELLANEOUS) ×2 IMPLANT
TRAY FAXITRON CT DISP (TRAY / TRAY PROCEDURE) ×2 IMPLANT
TUBE CONNECTING 20X1/4 (TUBING) ×2 IMPLANT
YANKAUER SUCT BULB TIP NO VENT (SUCTIONS) ×2 IMPLANT

## 2021-10-05 NOTE — Addendum Note (Signed)
Addendum  created 10/05/21 1557 by Belinda Block, MD   Child order released for a procedure order, Clinical Note Signed, Image imported, Intraprocedure Blocks edited, SmartForm saved

## 2021-10-05 NOTE — Interval H&P Note (Signed)
History and Physical Interval Note:  10/05/2021 11:44 AM  Sabrina Wright  has presented today for surgery, with the diagnosis of RIGHT BREAST CANCER.  The various methods of treatment have been discussed with the patient and family. After consideration of risks, benefits and other options for treatment, the patient has consented to  Procedure(s): RIGHT BREAST LUMPECTOMY WITH RADIOACTIVE SEED AND SENTINEL LYMPH NODE BIOPSY (Right) as a surgical intervention.  The patient's history has been reviewed, patient examined, no change in status, stable for surgery.  I have reviewed the patient's chart and labs.  Questions were answered to the patient's satisfaction.     Autumn Messing III

## 2021-10-05 NOTE — Progress Notes (Signed)
Assisted Dr. Green with right, ultrasound guided, pectoralis block. Side rails up, monitors on throughout procedure. See vital signs in flow sheet. Tolerated Procedure well. 

## 2021-10-05 NOTE — Anesthesia Procedure Notes (Addendum)
  Anesthesia Regional Block: Pectoralis block  Laterality: Right  Prep: chloraprep       Needles:  Injection technique: Single-shot  Needle Type: Stimulator Needle - 40          Additional Needles:   Procedures: Doppler guided,,,, ultrasound used (permanent image in chart),,    Narrative:  Start time: 10/05/2021 10:45 AM End time: 10/05/2021 11:00 AM Injection made incrementally with aspirations every 5 mL.  Performed by: Personally  Anesthesiologist: Belinda Block, MD

## 2021-10-05 NOTE — Anesthesia Postprocedure Evaluation (Signed)
Anesthesia Post Note  Patient: Sabrina Wright  Procedure(s) Performed: RIGHT BREAST LUMPECTOMY WITH RADIOACTIVE SEED AND SENTINEL LYMPH NODE BIOPSY (Right: Breast)     Patient location during evaluation: PACU Anesthesia Type: General Level of consciousness: awake Pain management: pain level controlled Vital Signs Assessment: post-procedure vital signs reviewed and stable Respiratory status: spontaneous breathing Cardiovascular status: stable Postop Assessment: no apparent nausea or vomiting Anesthetic complications: no   No notable events documented.  Last Vitals:  Vitals:   10/05/21 1205 10/05/21 1430  BP: (!) 83/48 (!) 105/55  Pulse: (!) 55 73  Resp: 11 10  Temp:  (!) 36.3 C  SpO2: 97% 96%    Last Pain:  Vitals:   10/05/21 1430  TempSrc:   PainSc: 8                  Lezlie Ritchey

## 2021-10-05 NOTE — Anesthesia Preprocedure Evaluation (Signed)
Anesthesia Evaluation  Patient identified by MRN, date of birth, ID band Patient awake    Reviewed: Allergy & Precautions, NPO status , Patient's Chart, lab work & pertinent test results  Airway Mallampati: II  TM Distance: >3 FB     Dental   Pulmonary COPD, former smoker,    breath sounds clear to auscultation       Cardiovascular + dysrhythmias  Rhythm:Regular Rate:Normal     Neuro/Psych  Headaches,  Neuromuscular disease    GI/Hepatic negative GI ROS, Neg liver ROS,   Endo/Other  negative endocrine ROS  Renal/GU negative Renal ROS     Musculoskeletal   Abdominal   Peds  Hematology   Anesthesia Other Findings   Reproductive/Obstetrics                             Anesthesia Physical Anesthesia Plan  ASA: 3  Anesthesia Plan: General   Post-op Pain Management:    Induction: Intravenous  PONV Risk Score and Plan: 3 and Ondansetron, Dexamethasone and Midazolam  Airway Management Planned:   Additional Equipment:   Intra-op Plan:   Post-operative Plan: Extubation in OR  Informed Consent: I have reviewed the patients History and Physical, chart, labs and discussed the procedure including the risks, benefits and alternatives for the proposed anesthesia with the patient or authorized representative who has indicated his/her understanding and acceptance.       Plan Discussed with: CRNA and Anesthesiologist  Anesthesia Plan Comments:         Anesthesia Quick Evaluation

## 2021-10-05 NOTE — Op Note (Signed)
10/05/2021  2:23 PM  PATIENT:  Sabrina Wright  60 y.o. female  PRE-OPERATIVE DIAGNOSIS:  RIGHT BREAST CANCER  POST-OPERATIVE DIAGNOSIS:  RIGHT BREAST CANCER  PROCEDURE:  Procedure(s): RIGHT BREAST LUMPECTOMY WITH RADIOACTIVE SEED LOCALIZATION AND DEEP RIGHT AXILLARY SENTINEL LYMPH NODE BIOPSY (Right)  SURGEON:  Surgeon(s) and Role:    * Jovita Kussmaul, MD - Primary  PHYSICIAN ASSISTANT:   ASSISTANTS: none   ANESTHESIA:   local and general  EBL:  15 mL   BLOOD ADMINISTERED:none  DRAINS: none   LOCAL MEDICATIONS USED:  MARCAINE     SPECIMEN:  Source of Specimen:  right breast tissue with additional deep and superior margins and sentinel node x 2  DISPOSITION OF SPECIMEN:  PATHOLOGY  COUNTS:  YES  TOURNIQUET:  * No tourniquets in log *  DICTATION: .Dragon Dictation  After informed consent was obtained the patient was brought to the operating room and placed in the supine position on the operating table.  After adequate induction of general anesthesia the patient's right chest, breast, and axillary area were prepped with ChloraPrep, allowed to dry, and draped in usual sterile manner.  An appropriate timeout was performed.  At this point, 2 cc of iron oxide were injected in the subareolar plexus on the right breast.  Previously an I-125 seed was placed in the upper portion of the right breast to mark an area of invasive breast cancer.  The breast was massaged for 5 minutes.  The mag trace was used to identify a signal in the right axilla.  This area was infiltrated with quarter percent Marcaine.  A small transversely oriented incision was made with a 15 blade knife overlying the area of radioactivity.  The incision was carried through the skin and subcutaneous tissue sharply with the electrocautery.  Dissection was then carried deeply into the deep right axillary space with the electrocautery.  The mag trace was used to identify the 2 lymph nodes with signal.  Each of these nodes  was excised sharply with the electrocautery and the surrounding small vessels and lymphatics were controlled with clips.  Hemostasis was then achieved using the Bovie electrocautery.  There were no other hot or palpable nodes in the right axilla.  The deep layer of the incision was closed with interrupted 3-0 Vicryl stitches.  The skin was then closed with a running 4-0 Monocryl subcuticular stitch.  Attention was then turned to the right breast.  The neoprobe was set to I-125 in the area of radioactivity was readily identified.  The area around this was infiltrated with quarter percent Marcaine.  A curvilinear incision was made along the upper edge of the areola of the right breast with a 15 blade knife.  The incision was carried through the skin and subcutaneous tissue sharply with the electrocautery.  Dissection was carried throughout the upper portion of the right breast between the breast tissue and the subcutaneous fat and skin.  Once the dissection was well beyond the area of the cancer then I removed a circular portion of breast tissue sharply with the electrocautery around the radioactive seed while checking the area of radioactivity frequently.  Once the specimen was removed it was oriented with the appropriate paint colors.  A specimen radiograph was obtained that showed the clip and seed to be near the center of the specimen.  The specimen was then sent to pathology for further evaluation.  I did elect to take an additional superior and deep margin and these were  marked appropriately.  Hemostasis was achieved using the Bovie electrocautery.  The wound was irrigated with saline and infiltrated with more quarter percent Marcaine.  The deep layer of the wound was then closed with layers of interrupted 3-0 Vicryl stitches.  The skin was then closed with interrupted 4-0 Monocryl subcuticular stitches.  Dermabond dressings were applied.  The patient tolerated the procedure well.  At the end of the case all  needle sponge and instrument counts were correct.  The patient was then awakened and taken to recovery in stable condition.  PLAN OF CARE: Discharge to home after PACU  PATIENT DISPOSITION:  PACU - hemodynamically stable.   Delay start of Pharmacological VTE agent (>24hrs) due to surgical blood loss or risk of bleeding: not applicable

## 2021-10-05 NOTE — Discharge Instructions (Signed)
  No Tylenol or ibuprofen until after 5pm today if needed  Post Anesthesia Home Care Instructions  Activity: Get plenty of rest for the remainder of the day. A responsible individual must stay with you for 24 hours following the procedure.  For the next 24 hours, DO NOT: -Drive a car -Paediatric nurse -Drink alcoholic beverages -Take any medication unless instructed by your physician -Make any legal decisions or sign important papers.  Meals: Start with liquid foods such as gelatin or soup. Progress to regular foods as tolerated. Avoid greasy, spicy, heavy foods. If nausea and/or vomiting occur, drink only clear liquids until the nausea and/or vomiting subsides. Call your physician if vomiting continues.  Special Instructions/Symptoms: Your throat may feel dry or sore from the anesthesia or the breathing tube placed in your throat during surgery. If this causes discomfort, gargle with warm salt water. The discomfort should disappear within 24 hours.  If you had a scopolamine patch placed behind your ear for the management of post- operative nausea and/or vomiting:  1. The medication in the patch is effective for 72 hours, after which it should be removed.  Wrap patch in a tissue and discard in the trash. Wash hands thoroughly with soap and water. 2. You may remove the patch earlier than 72 hours if you experience unpleasant side effects which may include dry mouth, dizziness or visual disturbances. 3. Avoid touching the patch. Wash your hands with soap and water after contact with the patch.

## 2021-10-05 NOTE — Anesthesia Procedure Notes (Signed)
Procedure Name: LMA Insertion Date/Time: 10/05/2021 12:47 PM Performed by: Garrel Ridgel, CRNA Pre-anesthesia Checklist: Patient identified, Emergency Drugs available, Suction available and Patient being monitored Patient Re-evaluated:Patient Re-evaluated prior to induction Oxygen Delivery Method: Circle system utilized Preoxygenation: Pre-oxygenation with 100% oxygen Induction Type: IV induction Ventilation: Mask ventilation without difficulty LMA: LMA inserted LMA Size: 4.0 Number of attempts: 1 Placement Confirmation: positive ETCO2 Tube secured with: Tape Dental Injury: Teeth and Oropharynx as per pre-operative assessment

## 2021-10-05 NOTE — H&P (Signed)
PROVIDER: Landry Corporal, MD  MRN: [id number] DOB: [birth year] Subjective   Chief Complaint: No chief complaint on file.   History of Present Illness: Sabrina Wright is a 60 y.o. female who is seen today for right breast cancer. The patient is a 60 year old white female who was seen back in August with a 2 cm cancer in the upper outer quadrant of the right breast with clinically negative nodes. The cancer was essentially triple negative with a Ki-67 of 60%. She underwent neoadjuvant chemotherapy and has had a good response. Both of the adjacent masses have about decreased their size in half. Her lymph nodes continue to look normal. She is now ready to schedule her definitive surgery.  Review of Systems: A complete review of systems was obtained from the patient. I have reviewed this information and discussed as appropriate with the patient. See HPI as well for other ROS.  ROS   Medical History: Past Medical History:  Diagnosis Date   Anxiety state 02/12/2016  Last Assessment & Plan: Reviewed ongoing work stress. Continue sertraline.   History of left breast cancer 04/22/2017  Overview: s/p lumpectomy, and chemo/rad [name]) Last Assessment & Plan: Monthly breast exams, yearly mammogram.s   Lumbar herniated disc 01/03/2016  Last Assessment & Plan: Anticipate L sided lumbar HNP with +SLR L>R. Treat with dexamethasone 19m IM today, then start naprosyn/flexeril course. Update if not improving with treatment, update sooner if worsening or loss of strength of L leg. Out of work for 3 days. Pt agrees with plan.   Osteoporosis 04/22/2017  Overview: DEXA 11/2013 Femur -2.9, spine -3.4 DEXA 02/2017 -3.2 hip, -2.9 spine Last Assessment & Plan: Update DEXA. Has been forgetting PO boniva. Will try and schedule IV boniva for patient - she thinks she would do better with med compliance with this.   Vitamin B12 deficiency 02/14/2017  Last Assessment & Plan: b12 shot today. rec start 10028m daily.   Vitamin D  deficiency 02/07/2016  Last Assessment & Plan: Persistent despite cal/vit D 2 tablets daily. Will replace with vit D 50,000 IU weekly x 3-6 months.   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 No pertinent surgical history.   Allergies  Allergen Reactions   Codeine Nausea And Vomiting   Egg Derived Nausea And Vomiting   Current Outpatient Medications on File Prior to Visit  Medication Sig Dispense Refill   ALPRAZolam (XANAX) 0.5 MG tablet Take by mouth   calcium carbonate-vitamin D3 600 mg(1,50042m-800 unit Tab Take by mouth.   cholecalciferol, vitamin D3, 50,000 unit Tab Take by mouth.   citalopram (CELEXA) 20 MG tablet Take 20 mg by mouth once daily   clobetasol-emollient 0.05 % Crea APPLY 1 APPLICATION TOPICALLY 2 (TWO) TIMES DAILY. APPLY TO AFFECTED AREA NO MORE THAN 2 WEEKS AT A TIME+   fluticasone propionate (FLONASE) 50 mcg/actuation nasal spray Place 1 spray into both nostrils 2 (two) times daily 16 g 0   meloxicam (MOBIC) 7.5 MG tablet Take by mouth.   metoprolol tartrate (LOPRESSOR) 25 MG tablet Take 25 mg (1 tablet) TWO hours prior to CT   multivitamin with iron-minerals (VITAMINS AND MINERALS) tablet Take by mouth.   PROLIA 60 mg/mL inj syringe   promethazine-dextromethorphan (PROMETHAZINE-DM) 6.25-15 mg/5 mL  syrup Take 5 mLs by mouth every 6 (six) hours as needed for Cough 120 mL 0   sertraline (ZOLOFT) 50 MG tablet take 1 tablet by mouth once daily   SUMAtriptan (IMITREX) 100 MG tablet   traMADoL (ULTRAM) 50 mg tablet   No current facility-administered medications on file prior to visit.   Family History  Problem Relation Age of Onset   Breast cancer Mother    Social History   Tobacco Use  Smoking Status Current Every Day Smoker    Types: Cigarettes  Smokeless Tobacco Never Used    Social History   Socioeconomic History   Marital status: Married  Tobacco Use   Smoking status: Current Every Day Smoker  Types: Cigarettes   Smokeless tobacco: Never Used  Scientific laboratory technician Use: Every day  Substance and Sexual Activity   Alcohol use: Never   Drug use: Never   Sexual activity: Yes   Objective:   There were no vitals filed for this visit.  There is no height or weight on file to calculate BMI.  Physical Exam Vitals reviewed.  Constitutional:  General: She is not in acute distress. Appearance: Normal appearance.  HENT:  Head: Normocephalic and atraumatic.  Right Ear: External ear normal.  Left Ear: External ear normal.  Nose: Nose normal.  Mouth/Throat:  Mouth: Mucous membranes are moist.  Pharynx: Oropharynx is clear.  Eyes:  General: No scleral icterus. Extraocular Movements: Extraocular movements intact.  Conjunctiva/sclera: Conjunctivae normal.  Pupils: Pupils are equal, round, and reactive to light.  Cardiovascular:  Rate and Rhythm: Normal rate and regular rhythm.  Pulses: Normal pulses.  Heart sounds: Normal heart sounds.  Pulmonary:  Effort: Pulmonary effort is normal. No respiratory distress.  Breath sounds: Normal breath sounds.  Abdominal:  General: Bowel sounds are normal.  Palpations: Abdomen is soft.  Tenderness: There is no abdominal tenderness.  Musculoskeletal:  General: No swelling, tenderness or deformity. Normal range of motion.  Cervical back: Normal range of motion and neck supple.  Skin: General: Skin is warm and dry.  Coloration: Skin is not jaundiced.  Neurological:  General: No focal deficit present.  Mental Status: She is alert and oriented to person, place, and time.  Psychiatric:  Mood and Affect: Mood normal.  Behavior: Behavior normal.     Breast: There is no palpable mass in either breast. There is no palpable axillary, supraclavicular, or cervical  lymphadenopathy.  Labs, Imaging and Diagnostic Testing:  Assessment and Plan:  Diagnoses and all orders for this visit:  Malignant neoplasm of upper-outer quadrant of right female breast, unspecified estrogen receptor status (CMS-HCC)    The patient is a appears to have had a good response to neoadjuvant chemotherapy for a 2 cm area of cancer in the upper outer quadrant of the right breast. At this point she favors breast conservation which I feel is very reasonable. I have discussed with her in detail the risks and benefits of the operation as well as some of the technical aspects including the use of a radioactive seed for localization and she understands and wishes to proceed. She is also a good candidate for sentinel node biopsy.

## 2021-10-05 NOTE — Transfer of Care (Signed)
Immediate Anesthesia Transfer of Care Note  Patient: Sabrina Wright  Procedure(s) Performed: RIGHT BREAST LUMPECTOMY WITH RADIOACTIVE SEED AND SENTINEL LYMPH NODE BIOPSY (Right: Breast)  Patient Location: PACU  Anesthesia Type:General  Level of Consciousness: awake, alert , oriented and patient cooperative  Airway & Oxygen Therapy: Patient Spontanous Breathing and Patient connected to face mask oxygen  Post-op Assessment: Report given to RN and Post -op Vital signs reviewed and stable  Post vital signs: Reviewed and stable  Last Vitals:  Vitals Value Taken Time  BP 105/55 10/05/21 1430  Temp    Pulse 78 10/05/21 1434  Resp 10 10/05/21 1434  SpO2 97 % 10/05/21 1434  Vitals shown include unvalidated device data.  Last Pain:  Vitals:   10/05/21 1042  TempSrc: Oral  PainSc: 2       Patients Stated Pain Goal: 5 (58/30/94 0768)  Complications: No notable events documented.

## 2021-10-06 ENCOUNTER — Other Ambulatory Visit: Payer: Self-pay | Admitting: Hematology and Oncology

## 2021-10-08 ENCOUNTER — Encounter (HOSPITAL_BASED_OUTPATIENT_CLINIC_OR_DEPARTMENT_OTHER): Payer: Self-pay | Admitting: General Surgery

## 2021-10-08 NOTE — Addendum Note (Signed)
Addendum  created 10/08/21 1209 by Tyara Dassow, Ernesta Amble, CRNA   Charge Capture section accepted

## 2021-10-09 ENCOUNTER — Telehealth: Payer: Self-pay | Admitting: *Deleted

## 2021-10-09 NOTE — Telephone Encounter (Signed)
Received after hours message from pt regarding pain post lymph node surgery.  RN attempt x1 to return call, no answer.  LVM for pt to return call to the office.

## 2021-10-10 ENCOUNTER — Encounter: Payer: Self-pay | Admitting: *Deleted

## 2021-10-11 LAB — SURGICAL PATHOLOGY

## 2021-10-15 NOTE — Assessment & Plan Note (Signed)
History of left breast cancer status postlumpectomy 2003status post chemo with Adriamycin, radiation 06/05/2021:Pain in the right breast: Mammogram and ultrasound revealed indeterminate masses in the right breast spanning 2.4 cm biopsy revealed grade 3 IDC ER 5%, PR 0%, Ki67 60%, HER2 negative  Treatment plan: 1.Neoadjuvant chemotherapy with Taxotereand Cytoxanx4 (06/19/21- 08/21/21) 2.breast conserving surgery with sentinel lymph node biopsy: 0.4 cm grade 2 IDC with DCIS 0/6 LN Neg 3.Adjuvant radiation therapy 4.Consideration for adjuvant antiestrogen therapy with a 5% ER positivity. ----------------------------------------------------------------------------------------------------------------------- Pathology counseling: I discussed the final pathology report of the patient provided  a copy of this report. I discussed the margins as well as lymph node surgeries. We also discussed the final staging along with previously performed ER/PR and HER-2/neu testing.  RTC after XRT

## 2021-10-15 NOTE — Progress Notes (Signed)
Patient Care Team: [name], MD as PCP - General (Family Medicine) Mauro Kaufmann, RN as Oncology Nurse Navigator Rockwell Germany, RN as Oncology Nurse Navigator  DIAGNOSIS:    ICD-10-CM   1. Malignant neoplasm of upper-inner quadrant of right breast in female, estrogen receptor negative ([place])  C50.211    Z17.1       SUMMARY OF ONCOLOGIC HISTORY: Oncology History  History of left breast cancer  06/05/2021 Initial Biopsy   History of left breast cancer status postlumpectomy 2003 Pain in the right breast: Mammogram and ultrasound revealed indeterminate masses in the right breast spanning 2.4 cm biopsy revealed grade 3 IDC ER 5%, PR 0%, Ki67 60%, HER2 negative   Pain of left breast  Malignant neoplasm of upper-inner quadrant of right breast in female, estrogen receptor negative ([place])  06/05/2021 Initial Biopsy   History of left breast cancer status postlumpectomy 2003 Pain in the right breast: Mammogram and ultrasound revealed indeterminate masses in the right breast spanning 2.4 cm biopsy revealed grade 3 IDC ER 5%, PR 0%, Ki67 60%, HER2 negative   06/11/2021 Cancer Staging   Staging form: Breast, AJCC 8th Edition - Clinical stage from 06/11/2021: Stage IIB (cT2, cN0, cM0, G3, ER-, PR-, HER2-) - Signed by Nicholas Lose, MD on 06/11/2021 Stage prefix: Initial diagnosis Histologic grading system: 3 grade system    06/19/2021 -  Chemotherapy   Patient is on Treatment Plan : BREAST TC q21d       CHIEF COMPLIANT: Follow-up of right breast cancer  INTERVAL HISTORY: Sabrina Wright is a 60 y.o. with above-mentioned history of right breast cancer having undergone chemotherapy with Taxotere and Cytoxan. Right lumpectomy on 10/05/2021 showed focus of residual grade 2 invasive carcinoma and high grade DCIS with lymph nodes negative for carcinoma. She presents to the clinic today for follow-up.  Today she is complaining of intractable pain in the right axilla related to recent surgery.   Ultram is not helping her with the pain. Because of bilateral surgeries under the arms and she is limited in her ability to function with range of motion.  Because of this she is going to file for disability.  ALLERGIES:  is allergic to codeine, eggs or egg-derived products, and gadavist [gadobutrol].  MEDICATIONS:  Current Outpatient Medications  Medication Sig Dispense Refill   acetaminophen (TYLENOL) 325 MG tablet Take 2 tablets (650 mg total) by mouth every 6 (six) hours as needed for fever, headache or moderate pain.     ALPRAZolam (XANAX) 0.5 MG tablet TAKE 1/2 - 1 TABLETS (0.25-0.5 MG TOTAL) BY MOUTH AT BEDTIME. 30 tablet 1   Calcium Carb-Cholecalciferol (CALCIUM 600/VITAMIN D3) 600-800 MG-UNIT TABS Take 2 tablets by mouth daily. 60 tablet    Cholecalciferol (VITAMIN D) 50 MCG (2000 UT) CAPS Take 1 capsule (2,000 Units total) by mouth daily. 30 capsule    citalopram (CELEXA) 20 MG tablet TAKE 1 TABLET BY MOUTH EVERY DAY 90 tablet 0   clobetasol cream (TEMOVATE) 1.82 % APPLY 1 APPLICATION TOPICALLY 2 (TWO) TIMES DAILY. APPLY TO AFFECTED AREA NO MORE THAN 2 WEEKS AT A TIME+ (Patient taking differently: Apply 1 application topically 2 (two) times daily as needed (rash). APPLY TO AFFECTED AREA NO MORE THAN 2 WEEKS AT A TIME) 30 g 0   denosumab (PROLIA) 60 MG/ML SOSY injection INJECT 60MG SUBCUTANEOUSLY  EVERY 6 MONTHS (GIVEN AT  PRESCRIBERS OFFICE) (Patient taking differently: Inject 60 mg into the skin every 6 (six) months. INJECT 60MG SUBCUTANEOUSLY  EVERY 6 MONTHS (GIVEN AT  PRESCRIBERS OFFICE)) 1 mL 1   dexamethasone (DECADRON) 4 MG tablet Take 1 tablet (4 mg total) by mouth daily. Start the day before Taxotere. Then again the day after chemo with food 12 tablet 0   HYDROcodone-acetaminophen (NORCO/VICODIN) 5-325 MG tablet Take 1 tablet by mouth every 6 (six) hours as needed for moderate pain or severe pain. 10 tablet 0   lidocaine-prilocaine (EMLA) cream Apply to affected area once (Patient  not taking: Reported on 06/25/2021) 30 g 3   loratadine (CLARITIN) 10 MG tablet Take 10 mg by mouth as needed for allergies.     meloxicam (MOBIC) 7.5 MG tablet TAKE 1 TABLET BY MOUTH EVERY DAY 30 tablet 6   Multiple Vitamin (MULTIVITAMIN WITH MINERALS) TABS tablet Take 1 tablet by mouth at bedtime.     ondansetron (ZOFRAN) 8 MG tablet Take 1 tablet (8 mg total) by mouth 2 (two) times daily as needed for refractory nausea / vomiting. Start on day 3 after chemo. 30 tablet 1   prochlorperazine (COMPAZINE) 10 MG tablet Take 1 tablet (10 mg total) by mouth every 6 (six) hours as needed (Nausea or vomiting). (Patient not taking: Reported on 06/25/2021) 30 tablet 1   SUMAtriptan (IMITREX) 100 MG tablet TAKE 1 TABLET BY MOUTH ONCE FOR 1 DOSE. MAY REPEAT IN 2 HOURS IF HEADACHE PERSISTS OR RECURS. 9 tablet 3   traMADol (ULTRAM) 50 MG tablet Take 1 tablet (50 mg total) by mouth every 6 (six) hours as needed. 30 tablet 0   traMADol (ULTRAM) 50 MG tablet Take 1 tablet (50 mg total) by mouth every 6 (six) hours as needed. 20 tablet 0   vitamin B-12 (V-R VITAMIN B-12) 500 MCG tablet Take 1 tablet (500 mcg total) by mouth daily.     No current facility-administered medications for this visit.    PHYSICAL EXAMINATION: ECOG PERFORMANCE STATUS: 1 - Symptomatic but completely ambulatory  Vitals:   10/16/21 1443  BP: 107/80  Pulse: 77  Resp: 18  Temp: 97.9 F (36.6 C)  SpO2: 98%   Filed Weights   10/16/21 1443  Weight: 180 lb 12.8 oz (82 kg)    BREAST: No palpable masses or nodules in either right or left breasts. No palpable axillary supraclavicular or infraclavicular adenopathy no breast tenderness or nipple discharge. (exam performed in the presence of a chaperone)  LABORATORY DATA:  I have reviewed the data as listed CMP Latest Ref Rng & Units 08/21/2021 07/31/2021 07/10/2021  Glucose 70 - 99 mg/dL 145(H) 81 89  BUN 6 - 20 mg/dL '11 11 17  ' Creatinine 0.44 - 1.00 mg/dL 0.76 0.65 0.71  Sodium 135 -  145 mmol/L 137 138 139  Potassium 3.5 - 5.1 mmol/L 4.1 3.9 4.1  Chloride 98 - 111 mmol/L 103 105 105  CO2 22 - 32 mmol/L 21(L) 24 24  Calcium 8.9 - 10.3 mg/dL 9.7 9.4 10.0  Total Protein 6.5 - 8.1 g/dL 7.2 6.8 7.2  Total Bilirubin 0.3 - 1.2 mg/dL 0.3 0.2(L) 0.4  Alkaline Phos 38 - 126 U/L 65 64 70  AST 15 - 41 U/L '24 21 17  ' ALT 0 - 44 U/L '22 18 14    ' Lab Results  Component Value Date   WBC 17.9 (H) 08/21/2021   HGB 11.9 (L) 08/21/2021   HCT 34.8 (L) 08/21/2021   MCV 85.3 08/21/2021   PLT 439 (H) 08/21/2021   NEUTROABS 16.7 (H) 08/21/2021    ASSESSMENT & PLAN:  Malignant neoplasm of upper-inner quadrant of right breast in female, estrogen receptor negative ([place]) History of left breast cancer status postlumpectomy 2003 status post chemo with Adriamycin, radiation 06/05/2021: Pain in the right breast: Mammogram and ultrasound revealed indeterminate masses in the right breast spanning 2.4 cm biopsy revealed grade 3 IDC ER 5%, PR 0%, 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  Neoadjuvant chemotherapy with Taxotere and Cytoxan x4 (06/19/21- 08/21/21) 2. breast conserving surgery with sentinel lymph node biopsy: 0.4 cm grade 2 IDC with DCIS 0/6 LN Neg, ER 0%, PR 0%, Ki-67 5%, HER2 0 3.  Adjuvant radiation therapy 4.  Based on final pathology being ER negative, I did not recommend antiestrogen therapy. ----------------------------------------------------------------------------------------------------------------------- Pathology counseling: I discussed the final pathology report of the patient provided  a copy of this report. I discussed the margins as well as lymph node surgeries. We also discussed the final staging along with previously performed ER/PR and HER-2/neu testing.  Intractable pain in the right axilla: Ultram is not helping her.  I gave her 30 tablets of Vicodin. Severe limitation of activity and motion of bilateral upper extremities since she had bilateral breast surgeries:  Because of this she is unable to function at work and will be filing for disability.  RTC after XRT to discuss the pros and cons of adjuvant Xeloda    No orders of the defined types were placed in this encounter.  The patient has a good understanding of the overall plan. she agrees with it. she will call with any problems that may develop before the next visit here.  Total time spent: 30 mins including face to face time and time spent for planning, charting and coordination of care  Rulon Eisenmenger, MD, MPH 10/16/2021  I, Thana Ates, am acting as scribe for Dr. Nicholas Lose.  I have reviewed the above documentation for accuracy and completeness, and I agree with the above.

## 2021-10-16 ENCOUNTER — Telehealth: Payer: Self-pay | Admitting: *Deleted

## 2021-10-16 ENCOUNTER — Other Ambulatory Visit: Payer: Self-pay | Admitting: Hematology and Oncology

## 2021-10-16 ENCOUNTER — Other Ambulatory Visit: Payer: Self-pay

## 2021-10-16 ENCOUNTER — Inpatient Hospital Stay: Payer: BC Managed Care – PPO | Attending: Hematology and Oncology | Admitting: Hematology and Oncology

## 2021-10-16 DIAGNOSIS — C50211 Malignant neoplasm of upper-inner quadrant of right female breast: Secondary | ICD-10-CM | POA: Insufficient documentation

## 2021-10-16 DIAGNOSIS — Z171 Estrogen receptor negative status [ER-]: Secondary | ICD-10-CM | POA: Diagnosis not present

## 2021-10-16 DIAGNOSIS — Z79899 Other long term (current) drug therapy: Secondary | ICD-10-CM | POA: Insufficient documentation

## 2021-10-16 DIAGNOSIS — Z7952 Long term (current) use of systemic steroids: Secondary | ICD-10-CM | POA: Diagnosis not present

## 2021-10-16 MED ORDER — HYDROCODONE-ACETAMINOPHEN 5-325 MG PO TABS: 1.0000 | ORAL_TABLET | Freq: Three times a day (TID) | ORAL | 0 refills | Status: DC | PRN

## 2021-10-16 NOTE — Telephone Encounter (Signed)
Received letter patient left with registration earlier from [facility] requesting records.  Connected with [employer] representative Lurline Idol for [place] [phone], ext. [phone] opt 1, opt. 1).  Provided fax number [phone].  Awaiting ROI to forward to (SW) H.I.M.  Shakira notified SW H.I.M. has thirty calendar days to process upon receipt of H.I.P.A.A approved signed release.

## 2021-10-17 NOTE — Telephone Encounter (Signed)
[employer] notified of transfer notice received 10/16/2021 at 2236 reads: medical records "Receive Error Log"; zero pages, restricted number.  Provided [phone] to re-send.  Currently waiting.    Letter reads return records to e-mail [email or web address] may be why initial fax not received.

## 2021-10-22 NOTE — Telephone Encounter (Signed)
E-mailed [facility] authorization disclosure to [email or web address].

## 2021-10-22 NOTE — Progress Notes (Signed)
Location of Breast Cancer: upper-inner quadrant of right breast    Histology per Pathology Report:  06/05/2021   10/05/2021 A. LYMPH NODE, RIGHT AXILLARY #1, SENTINEL, EXCISION:  - Lymph node, negative for carcinoma (0/1)   B. LYMPH NODE, RIGHT AXILLARY, SENTINEL, EXCISION:  - Lymph node, negative for carcinoma (0/1)   C. LYMPH NODE, RIGHT AXILLARY, SENTINEL, EXCISION:  - Lymph node, negative for carcinoma (0/1)   D. LYMPH NODE, RIGHT AXILLARY, SENTINEL, EXCISION:  - Lymph node, negative for carcinoma (0/1)   E. LYMPH NODE, RIGHT AXILLARY #2, SENTINEL, EXCISION:  - Lymph node, negative for carcinoma (0/1)    F. LYMPH NODE, RIGHT AXILLARY, SENTINEL, EXCISION:  - Lymph node, negative for carcinoma (0/1)   G. BREAST, RIGHT, LUMPECTOMY:  - Focus of residual invasive carcinoma, 0.4 cm, grade 2  - Foci of ductal carcinoma in situ, high-grade  - Therapy related changes including hyalin fibrosis  - Resection margins are negative for carcinoma  - Previous procedure-related changes  - See oncology table   H. BREAST, RIGHT DEEP MARGIN, EXCISION:  - Benign breast parenchyma, negative for carcinoma   I. BREAST, RIGHT SUPERIOR MARGIN, EXCISION:  - Benign breast parenchyma, negative for carcinoma   Receptor Status:  Estrogen Receptor: 5%, positive, weak staining intensity       Progesterone Receptor: 0%, negative       Her2: Negative (1+)       Ki-67: 60%    Did patient present with symptoms (if so, please note symptoms) or was this found on screening mammography?: History of left breast cancer status postlumpectomy 2003 Pain in the right breast: Mammogram and ultrasound revealed indeterminate masses in the right breast spanning 2.4 cm   Past/Anticipated interventions by surgeon, if any:  status postlumpectomy 2003,  06/05/2021 Margarette Canada M.D. ULTRASOUND GUIDED RIGHT BREAST CORE NEEDLE BIOPSY  10/05/2021 Procedure(s): RIGHT BREAST LUMPECTOMY WITH RADIOACTIVE SEED LOCALIZATION AND  DEEP RIGHT AXILLARY SENTINEL LYMPH NODE BIOPSY (Right)   SURGEON:  Jovita Kussmaul, MD   Past/Anticipated interventions by medical oncology, if any: Dr Lindi Adie Chemotherapy  Patient is on Treatment Plan: BREAST TC Q21D   Lymphedema issues, if any:  no    Pain issues, if any:   yes, low back and legs constant and aching   SAFETY ISSUES: Prior radiation? yes, 2003 to left breast Pacemaker/ICD?  no Possible current pregnancy? no, post menopausal Is the patient on methotrexate?  no   Current Complaints / other details:  none     Vitals:   10/31/21 1338  BP: 139/81  Pulse: 65  Resp: 18  Temp: (!) 97.5 F (36.4 C)  SpO2: 96%  Weight: 183 lb (83 kg)  Height: '5\' 4"'  (1.626 m)

## 2021-10-23 NOTE — Telephone Encounter (Signed)
"  Sabrina Wright [phone]).  Received e-mail from [facility] in PDF form downloaded but not sure how to fill it out.  Can I come in office and meet with Roz?  Call me; I have an appointment today with surgeon and can come by."   Returned patient call.  Advised yes, she may sign release in office.  Says she will do so after leaving surgeons office visit today.

## 2021-10-30 NOTE — Progress Notes (Signed)
Radiation Oncology         [phone] ________________________________  Name: Sabrina Wright MRN: [id number]  Date: 10/31/2021  DOB: 1960/11/25  Re-Evaluation Note  CC: Sabrina Bush, MD  Sabrina Lose, MD    ICD-10-CM   1. Malignant neoplasm of upper-inner quadrant of right breast in female, estrogen receptor negative [facility])  C50.211 Ambulatory referral to Social Work   Z17.1     2. 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 She was seen for consultation on 06/25/21.   She has been treated with chemotherapy consisting of Taxotere and Cytoxan on 06/19/21 through 08/21/21 under the care of Dr. Lindi Wright.  She opted to proceed with right breast lumpectomy and nodal biopsies on 10/05/21 under the care of Dr. Marlou Wright. Pathology from the procedure revealed:  --Right lumpectomy: focus of grade 2 residual invasive carcinoma measuring 0.4 cm, and a foci of high grade DCIS. All resection margins negative for carcinoma. (Therapy related changes including hyalin fibrosis were also noted on pathology).  Prognostic indicators significant for:  ER status negative; PR status negative; Her2 status negative; proliferation marker Ki67 at 5%; grade 2.  --Right axillary sentinel lymph node excisions: 6/6 sentinel lymph nodes negative for carcinoma.  During the patient's most recent follow up with Dr. Lindi Wright on 10/16/21, she reported intractable pain in the right axilla related to her recent surgery.  Per Dr. Lindi Wright, Sabrina Wright is not helping her with the pain. Because of her bilateral surgeries under the arms, her ROM is limited. Subsequently, the patient reported to Dr. Lindi Wright that she is going to file for disability. Given that ultram is not  helping her, Dr. Lindi Wright prescribed the patient 30 tablets of Vicodin.   Pertinent imaging since the patient was seen for her initial consultation is as follows:  --Bilateral breast MRI on 08/22/21 showed a marked reduction in size and degree of enhancement of the previously biopsied 2 areas of malignancy in the upper outer quadrant of the right breast following neoadjuvant chemotherapy.   On review of systems, the patient reports some stiffness in the right shoulder and axillary region but feels she will be able to position her arm for simulation and treatment today. She denies swelling in her right arm.    Allergies:  is allergic to codeine, eggs or egg-derived products, and gadavist [gadobutrol].  Meds: Current Outpatient Medications  Medication Sig Dispense Refill   acetaminophen (TYLENOL) 325 MG tablet Take 2 tablets (650 mg total) by mouth every 6 (six) hours as needed for fever, headache or moderate pain.     ALPRAZolam (XANAX) 0.5 MG tablet TAKE 1/2 - 1 TABLETS (0.25-0.5 MG TOTAL) BY MOUTH AT BEDTIME. 30 tablet 1   Calcium Carb-Cholecalciferol (CALCIUM 600/VITAMIN D3) 600-800 MG-UNIT TABS Take 2 tablets by mouth daily. 60 tablet    Cholecalciferol (VITAMIN D) 50 MCG (2000 UT) CAPS Take 1 capsule (2,000 Units total) by mouth daily. 30 capsule    citalopram (CELEXA) 20 MG tablet TAKE 1 TABLET BY MOUTH EVERY DAY 90 tablet 0   HYDROcodone-acetaminophen (NORCO/VICODIN) 5-325 MG tablet Take 1 tablet by mouth every 8 (eight) hours as needed. 30 tablet 0   lidocaine-prilocaine (EMLA) cream Apply to affected area once  3  ° meloxicam (MOBIC) 7.5 MG tablet TAKE 1 TABLET BY MOUTH EVERY DAY 30 tablet 6  ° Multiple Vitamin (MULTIVITAMIN WITH MINERALS) TABS tablet Take 1 tablet by mouth at bedtime.    ° SUMAtriptan (IMITREX) 100 MG tablet TAKE 1 TABLET BY MOUTH ONCE FOR 1 DOSE. MAY REPEAT IN 2 HOURS IF HEADACHE PERSISTS OR RECURS. 9 tablet 3  ° vitamin B-12 (V-R VITAMIN B-12) 500 MCG tablet Take 1  tablet (500 mcg total) by mouth daily.    ° clobetasol cream (TEMOVATE) 0.05 % APPLY 1 APPLICATION TOPICALLY 2 (TWO) TIMES DAILY. APPLY TO AFFECTED AREA NO MORE THAN 2 WEEKS AT A TIME+ (Patient not taking: Reported on 10/31/2021) 30 g 0  ° denosumab (PROLIA) 60 MG/ML SOSY injection INJECT 60MG SUBCUTANEOUSLY  EVERY 6 MONTHS (GIVEN AT  PRESCRIBERS OFFICE) (Patient taking differently: Inject 60 mg into the skin every 6 (six) months. INJECT 60MG SUBCUTANEOUSLY  EVERY 6 MONTHS (GIVEN AT  PRESCRIBERS OFFICE)) 1 mL 1  ° dexamethasone (DECADRON) 4 MG tablet Take 1 tablet (4 mg total) by mouth daily. Start the day before Taxotere. Then again the day after chemo with food (Patient not taking: Reported on 10/31/2021) 12 tablet 0  ° loratadine (CLARITIN) 10 MG tablet Take 10 mg by mouth as needed for allergies. (Patient not taking: Reported on 10/31/2021)    ° ondansetron (ZOFRAN) 8 MG tablet Take 1 tablet (8 mg total) by mouth 2 (two) times daily as needed for refractory nausea / vomiting. Start on day 3 after chemo. (Patient not taking: Reported on 10/31/2021) 30 tablet 1  ° prochlorperazine (COMPAZINE) 10 MG tablet Take 1 tablet (10 mg total) by mouth every 6 (six) hours as needed (Nausea or vomiting). (Patient not taking: Reported on 06/25/2021) 30 tablet 1  ° traMADol (ULTRAM) 50 MG tablet Take 1 tablet (50 mg total) by mouth every 6 (six) hours as needed. (Patient not taking: Reported on 10/31/2021) 30 tablet 0  ° traMADol (ULTRAM) 50 MG tablet Take 1 tablet (50 mg total) by mouth every 6 (six) hours as needed. (Patient not taking: Reported on 10/31/2021) 20 tablet 0  ° °No current facility-administered medications for this encounter.  ° ° °Physical Findings: °The patient is in no acute distress. Patient is alert and oriented. ° height is 5' 4" (1.626 m) and weight is 183 lb (83 kg). Her temperature is 97.5 °F (36.4 °C) (abnormal). Her blood pressure is 139/81 and her pulse is 65. Her respiration is 18 and oxygen  saturation is 96%.   Lungs are clear to auscultation bilaterally. Heart has regular rate and rhythm. No palpable cervical, supraclavicular, or axillary adenopathy. Abdomen soft, non-tender, normal bowel sounds. °Left breast: no palpable mass, nipple discharge or bleeding. °Right breast: Lumpectomy scar and axillary scar well-healed at this time.  No signs of infection within the breast nipple discharge or bleeding.  No drainage from surgery scars.  Patient is tender with palpation along the breast area. ° °Lab Findings: °Lab Results  °Component Value Date  ° WBC 17.9 (H) 08/21/2021  ° HGB 11.9 (L) 08/21/2021  ° HCT 34.8 (L) 08/21/2021  ° MCV 85.3 08/21/2021  ° PLT 439 (H) 08/21/2021  ° ° °Radiographic Findings: °MM Breast Surgical Specimen ° °Result Date: 10/05/2021 °CLINICAL DATA:  Evaluate surgical specimen following lumpectomy for RIGHT breast cancer. EXAM: SPECIMEN RADIOGRAPH OF THE RIGHT BREAST COMPARISON:  Previous exam(s). FINDINGS: Status post excision of the RIGHT breast. The radioactive seed and RIBBON biopsy marker clip   marker clip are present and completely intact. IMPRESSION: Specimen radiograph of the RIGHT breast. Electronically Signed   By: Margarette Canada M.D.   On: 10/05/2021 14:03  MM RT RADIOACTIVE SEED LOC MAMMO GUIDE  Result Date: 10/04/2021 CLINICAL DATA:  Pre lumpectomy localization of recently diagnosed right breast invasive ductal carcinoma, marked with a ribbon shaped biopsy marker clip. EXAM: MAMMOGRAPHIC GUIDED RADIOACTIVE SEED LOCALIZATION OF THE RIGHT BREAST COMPARISON:  Previous exam(s). FINDINGS: Patient presents for radioactive seed localization prior to right lumpectomy. I met with the patient and we discussed the procedure of seed localization including benefits and alternatives. We discussed the high likelihood of a successful procedure. We discussed the risks of the procedure including infection, bleeding, tissue injury and further surgery. We discussed the low dose of radioactivity involved  in the procedure. Informed, written consent was given. The usual time-out protocol was performed immediately prior to the procedure. Using mammographic guidance, sterile technique, 1% lidocaine and an I-125 radioactive seed, the recently placed ribbon shaped biopsy marker clip was localized using a cephalad approach. The follow-up mammogram images confirm the seed in the expected location and were marked for Dr. Marlou Wright. Follow-up survey of the patient confirms presence of the radioactive seed. Order number of I-125 seed:  [id number]. Total activity:  0.241 mCi reference Date: 08/30/2021 The patient tolerated the procedure well and was released from the [facility]. She was given instructions regarding seed removal. IMPRESSION: Radioactive seed localization right breast. No apparent complications. Electronically Signed   By: Claudie Revering M.D.   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  She would be a good candidate for breast conservation with radiation therapy directed at the right breast.  I discussed the general course of radiation therapy anticipated side effects and potential long-term toxicities of right breast radiation therapy.  The patient appears to understand and wishes to proceed with planned course of treatment.  Plan:  Patient is scheduled for CT simulation later today.  Treatments to begin in  approximately a week.  In light of her previous left breast radiation therapy and her medially placed lumpectomy cavity along the left breast, with potential for overlap with her previous treatment, I would not recommend hypofractionated accelerated radiation therapy  in this situation.  She will receive conventional radiation therapy over approximately 6 and half weeks.  -----------------------------------  Blair Promise, PhD, MD  This document serves as a record of services personally performed by Gery Pray, MD. It was created on his behalf by Roney Mans, a trained medical scribe. The creation of this record is based on the scribe's personal observations and the provider's statements to them. This document has been checked and approved by the attending provider.

## 2021-10-31 ENCOUNTER — Ambulatory Visit
Admission: RE | Admit: 2021-10-31 | Discharge: 2021-10-31 | Disposition: A | Discharge status: Home / Self Care | Payer: BC Managed Care – PPO | Source: Ambulatory Visit | Attending: Radiation Oncology | Admitting: Radiation Oncology

## 2021-10-31 ENCOUNTER — Other Ambulatory Visit: Payer: Self-pay | Admitting: Family Medicine

## 2021-10-31 ENCOUNTER — Other Ambulatory Visit: Payer: Self-pay

## 2021-10-31 ENCOUNTER — Encounter: Payer: Self-pay | Admitting: Radiation Oncology

## 2021-10-31 ENCOUNTER — Other Ambulatory Visit: Payer: Self-pay | Admitting: Hematology and Oncology

## 2021-10-31 VITALS — BP 139/81 | HR 65 | Temp 97.5°F | Resp 18 | Ht 64.0 in | Wt 183.0 lb

## 2021-10-31 DIAGNOSIS — C50211 Malignant neoplasm of upper-inner quadrant of right female breast: Secondary | ICD-10-CM | POA: Diagnosis not present

## 2021-10-31 DIAGNOSIS — Z923 Personal history of irradiation: Secondary | ICD-10-CM | POA: Diagnosis not present

## 2021-10-31 DIAGNOSIS — Z79899 Other long term (current) drug therapy: Secondary | ICD-10-CM | POA: Diagnosis not present

## 2021-10-31 DIAGNOSIS — Z171 Estrogen receptor negative status [ER-]: Secondary | ICD-10-CM | POA: Diagnosis not present

## 2021-10-31 DIAGNOSIS — Z9221 Personal history of antineoplastic chemotherapy: Secondary | ICD-10-CM | POA: Insufficient documentation

## 2021-10-31 DIAGNOSIS — Z791 Long term (current) use of non-steroidal anti-inflammatories (NSAID): Secondary | ICD-10-CM | POA: Diagnosis not present

## 2021-10-31 DIAGNOSIS — Z510 Encounter for antineoplastic radiation therapy: Secondary | ICD-10-CM | POA: Insufficient documentation

## 2021-10-31 DIAGNOSIS — Z7952 Long term (current) use of systemic steroids: Secondary | ICD-10-CM | POA: Diagnosis not present

## 2021-10-31 DIAGNOSIS — Z170 Estrogen receptor positive status [ER+]: Secondary | ICD-10-CM | POA: Insufficient documentation

## 2021-10-31 DIAGNOSIS — Z853 Personal history of malignant neoplasm of breast: Secondary | ICD-10-CM

## 2021-10-31 NOTE — Progress Notes (Signed)
See MD note for nursing evaluation. °

## 2021-11-01 ENCOUNTER — Encounter: Payer: Self-pay | Admitting: Licensed Clinical Social Worker

## 2021-11-01 ENCOUNTER — Telehealth: Payer: Self-pay | Admitting: Licensed Clinical Social Worker

## 2021-11-01 NOTE — Telephone Encounter (Signed)
[place] Work  Clinical Social Work was referred by radiation team for assessment of psychosocial needs.  Clinical Social Worker  attempted to contact patient by phone   to offer support and assess for needs.   No answer. Left VM with direct contact information.      [place], [place] Worker [employer]

## 2021-11-01 NOTE — Progress Notes (Signed)
[place] CSW Progress Note  Clinical Education officer, museum contacted patient by phone to follow-up on coping and psychosocial needs. Pt reports stress over finances and insurance. She is currently on short term disability through work but it is running out in February and she does not have long term disability benefits. She is also worried about losing insurance when that happens. Discussed trying to apply for Medicaid or going through [employer].  Also discussed other financial assistance options. Pt has three sets of [place] cards left and will let this CSW know when she can pick them up. At that time, CSW will also give pt the [place] application again.  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    Christeen Douglas , LCSW

## 2021-11-02 ENCOUNTER — Other Ambulatory Visit: Payer: Self-pay | Admitting: Urology

## 2021-11-02 ENCOUNTER — Telehealth: Payer: Self-pay | Admitting: Radiology

## 2021-11-02 MED ORDER — TRAMADOL HCL 50 MG PO TABS: 50.0000 mg | ORAL_TABLET | Freq: Four times a day (QID) | ORAL | 1 refills | Status: DC | PRN

## 2021-11-02 NOTE — Telephone Encounter (Signed)
Patient requests a refill of Tramadol to be sent to [id number] [place], [place].

## 2021-11-06 ENCOUNTER — Encounter: Payer: Self-pay | Admitting: Hematology and Oncology

## 2021-11-06 ENCOUNTER — Encounter: Payer: Self-pay | Admitting: *Deleted

## 2021-11-07 ENCOUNTER — Telehealth: Payer: Self-pay | Admitting: Hematology and Oncology

## 2021-11-07 DIAGNOSIS — C50211 Malignant neoplasm of upper-inner quadrant of right female breast: Secondary | ICD-10-CM | POA: Insufficient documentation

## 2021-11-07 DIAGNOSIS — Z171 Estrogen receptor negative status [ER-]: Secondary | ICD-10-CM | POA: Insufficient documentation

## 2021-11-07 DIAGNOSIS — Z510 Encounter for antineoplastic radiation therapy: Secondary | ICD-10-CM | POA: Diagnosis not present

## 2021-11-07 NOTE — Telephone Encounter (Signed)
Scheduled per 01/03 scheduled message, patient has been called and notified of upcoming appointments.

## 2021-11-08 ENCOUNTER — Other Ambulatory Visit: Payer: Self-pay

## 2021-11-08 ENCOUNTER — Ambulatory Visit
Admission: RE | Admit: 2021-11-08 | Discharge: 2021-11-08 | Disposition: A | Discharge status: Home / Self Care | Payer: BC Managed Care – PPO | Source: Ambulatory Visit | Attending: Radiation Oncology | Admitting: Radiation Oncology

## 2021-11-08 DIAGNOSIS — C50211 Malignant neoplasm of upper-inner quadrant of right female breast: Secondary | ICD-10-CM

## 2021-11-08 DIAGNOSIS — Z171 Estrogen receptor negative status [ER-]: Secondary | ICD-10-CM

## 2021-11-08 DIAGNOSIS — Z510 Encounter for antineoplastic radiation therapy: Secondary | ICD-10-CM | POA: Diagnosis not present

## 2021-11-09 ENCOUNTER — Ambulatory Visit
Admission: RE | Admit: 2021-11-09 | Discharge: 2021-11-09 | Disposition: A | Discharge status: Home / Self Care | Payer: BC Managed Care – PPO | Source: Ambulatory Visit | Attending: Radiation Oncology | Admitting: Radiation Oncology

## 2021-11-09 DIAGNOSIS — Z171 Estrogen receptor negative status [ER-]: Secondary | ICD-10-CM | POA: Diagnosis not present

## 2021-11-09 DIAGNOSIS — Z510 Encounter for antineoplastic radiation therapy: Secondary | ICD-10-CM | POA: Diagnosis not present

## 2021-11-09 DIAGNOSIS — C50211 Malignant neoplasm of upper-inner quadrant of right female breast: Secondary | ICD-10-CM | POA: Diagnosis not present

## 2021-11-12 ENCOUNTER — Other Ambulatory Visit: Payer: Self-pay

## 2021-11-12 ENCOUNTER — Ambulatory Visit
Admission: RE | Admit: 2021-11-12 | Discharge: 2021-11-12 | Disposition: A | Discharge status: Home / Self Care | Payer: BC Managed Care – PPO | Source: Ambulatory Visit | Attending: Radiation Oncology | Admitting: Radiation Oncology

## 2021-11-12 DIAGNOSIS — C50211 Malignant neoplasm of upper-inner quadrant of right female breast: Secondary | ICD-10-CM | POA: Diagnosis not present

## 2021-11-12 DIAGNOSIS — Z510 Encounter for antineoplastic radiation therapy: Secondary | ICD-10-CM | POA: Diagnosis not present

## 2021-11-12 DIAGNOSIS — Z171 Estrogen receptor negative status [ER-]: Secondary | ICD-10-CM | POA: Diagnosis not present

## 2021-11-13 ENCOUNTER — Ambulatory Visit
Admission: RE | Admit: 2021-11-13 | Discharge: 2021-11-13 | Disposition: A | Discharge status: Home / Self Care | Payer: BC Managed Care – PPO | Source: Ambulatory Visit | Attending: Radiation Oncology | Admitting: Radiation Oncology

## 2021-11-13 DIAGNOSIS — Z510 Encounter for antineoplastic radiation therapy: Secondary | ICD-10-CM | POA: Diagnosis not present

## 2021-11-13 DIAGNOSIS — C50211 Malignant neoplasm of upper-inner quadrant of right female breast: Secondary | ICD-10-CM

## 2021-11-13 DIAGNOSIS — Z171 Estrogen receptor negative status [ER-]: Secondary | ICD-10-CM

## 2021-11-13 MED ORDER — SONAFINE EX EMUL
1.0000 "application " | Freq: Once | CUTANEOUS | Status: AC
  Administered 2021-11-13: 1 via TOPICAL

## 2021-11-13 MED ORDER — ALRA NON-METALLIC DEODORANT (RAD-ONC)
1.0000 "application " | Freq: Once | TOPICAL | Status: AC
  Administered 2021-11-13: 1 via TOPICAL

## 2021-11-13 NOTE — Progress Notes (Signed)
Pt here for patient teaching.    Pt given skin care instructions, Alra deodorant, and Radiaplex gel.    Reviewed areas of pertinence such as fatigue, hair loss, skin changes, breast tenderness, and breast swelling .   Pt able to give teach back of to pat skin and use unscented/gentle soap,apply Radiaplex bid, avoid applying anything to skin within 4 hours of treatment, avoid wearing an under wire bra, and to use an electric razor if they must shave.   Pt verbalizes understanding of information given and will contact nursing with any questions or concerns.    [email or web address]

## 2021-11-14 ENCOUNTER — Other Ambulatory Visit: Payer: Self-pay

## 2021-11-14 ENCOUNTER — Ambulatory Visit
Admission: RE | Admit: 2021-11-14 | Discharge: 2021-11-14 | Disposition: A | Discharge status: Home / Self Care | Payer: BC Managed Care – PPO | Source: Ambulatory Visit | Attending: Radiation Oncology | Admitting: Radiation Oncology

## 2021-11-14 DIAGNOSIS — Z171 Estrogen receptor negative status [ER-]: Secondary | ICD-10-CM | POA: Diagnosis not present

## 2021-11-14 DIAGNOSIS — Z510 Encounter for antineoplastic radiation therapy: Secondary | ICD-10-CM | POA: Diagnosis not present

## 2021-11-14 DIAGNOSIS — C50211 Malignant neoplasm of upper-inner quadrant of right female breast: Secondary | ICD-10-CM | POA: Diagnosis not present

## 2021-11-15 ENCOUNTER — Ambulatory Visit
Admission: RE | Admit: 2021-11-15 | Discharge: 2021-11-15 | Disposition: A | Discharge status: Home / Self Care | Payer: BC Managed Care – PPO | Source: Ambulatory Visit | Attending: Radiation Oncology | Admitting: Radiation Oncology

## 2021-11-15 ENCOUNTER — Telehealth: Payer: Self-pay | Admitting: Radiology

## 2021-11-15 DIAGNOSIS — Z171 Estrogen receptor negative status [ER-]: Secondary | ICD-10-CM | POA: Diagnosis not present

## 2021-11-15 DIAGNOSIS — C50211 Malignant neoplasm of upper-inner quadrant of right female breast: Secondary | ICD-10-CM | POA: Diagnosis not present

## 2021-11-15 DIAGNOSIS — Z510 Encounter for antineoplastic radiation therapy: Secondary | ICD-10-CM | POA: Diagnosis not present

## 2021-11-15 NOTE — Telephone Encounter (Signed)
Patient requests refill of tramadol.

## 2021-11-16 ENCOUNTER — Other Ambulatory Visit: Payer: Self-pay | Admitting: Radiation Oncology

## 2021-11-16 ENCOUNTER — Ambulatory Visit
Admission: RE | Admit: 2021-11-16 | Discharge: 2021-11-16 | Disposition: A | Discharge status: Home / Self Care | Payer: BC Managed Care – PPO | Source: Ambulatory Visit | Attending: Radiation Oncology | Admitting: Radiation Oncology

## 2021-11-16 ENCOUNTER — Other Ambulatory Visit: Payer: Self-pay

## 2021-11-16 DIAGNOSIS — Z510 Encounter for antineoplastic radiation therapy: Secondary | ICD-10-CM | POA: Diagnosis not present

## 2021-11-16 DIAGNOSIS — Z171 Estrogen receptor negative status [ER-]: Secondary | ICD-10-CM | POA: Diagnosis not present

## 2021-11-16 DIAGNOSIS — C50211 Malignant neoplasm of upper-inner quadrant of right female breast: Secondary | ICD-10-CM | POA: Diagnosis not present

## 2021-11-19 ENCOUNTER — Other Ambulatory Visit: Payer: Self-pay

## 2021-11-19 ENCOUNTER — Ambulatory Visit
Admission: RE | Admit: 2021-11-19 | Discharge: 2021-11-19 | Disposition: A | Discharge status: Home / Self Care | Payer: BC Managed Care – PPO | Source: Ambulatory Visit | Attending: Radiation Oncology | Admitting: Radiation Oncology

## 2021-11-19 DIAGNOSIS — Z510 Encounter for antineoplastic radiation therapy: Secondary | ICD-10-CM | POA: Diagnosis not present

## 2021-11-19 DIAGNOSIS — Z171 Estrogen receptor negative status [ER-]: Secondary | ICD-10-CM | POA: Diagnosis not present

## 2021-11-19 DIAGNOSIS — C50211 Malignant neoplasm of upper-inner quadrant of right female breast: Secondary | ICD-10-CM | POA: Diagnosis not present

## 2021-11-20 ENCOUNTER — Ambulatory Visit
Admission: RE | Admit: 2021-11-20 | Discharge: 2021-11-20 | Disposition: A | Discharge status: Home / Self Care | Payer: BC Managed Care – PPO | Source: Ambulatory Visit | Attending: Radiation Oncology | Admitting: Radiation Oncology

## 2021-11-20 DIAGNOSIS — Z171 Estrogen receptor negative status [ER-]: Secondary | ICD-10-CM | POA: Diagnosis not present

## 2021-11-20 DIAGNOSIS — C50211 Malignant neoplasm of upper-inner quadrant of right female breast: Secondary | ICD-10-CM | POA: Diagnosis not present

## 2021-11-20 DIAGNOSIS — Z510 Encounter for antineoplastic radiation therapy: Secondary | ICD-10-CM | POA: Diagnosis not present

## 2021-11-21 ENCOUNTER — Other Ambulatory Visit: Payer: Self-pay

## 2021-11-21 ENCOUNTER — Ambulatory Visit
Admission: RE | Admit: 2021-11-21 | Discharge: 2021-11-21 | Disposition: A | Discharge status: Home / Self Care | Payer: BC Managed Care – PPO | Source: Ambulatory Visit | Attending: Radiation Oncology | Admitting: Radiation Oncology

## 2021-11-21 DIAGNOSIS — Z510 Encounter for antineoplastic radiation therapy: Secondary | ICD-10-CM | POA: Diagnosis not present

## 2021-11-21 DIAGNOSIS — Z171 Estrogen receptor negative status [ER-]: Secondary | ICD-10-CM | POA: Diagnosis not present

## 2021-11-21 DIAGNOSIS — C50211 Malignant neoplasm of upper-inner quadrant of right female breast: Secondary | ICD-10-CM | POA: Diagnosis not present

## 2021-11-22 ENCOUNTER — Ambulatory Visit
Admission: RE | Admit: 2021-11-22 | Discharge: 2021-11-22 | Disposition: A | Discharge status: Home / Self Care | Payer: BC Managed Care – PPO | Source: Ambulatory Visit | Attending: Radiation Oncology | Admitting: Radiation Oncology

## 2021-11-22 ENCOUNTER — Telehealth: Payer: Self-pay | Admitting: *Deleted

## 2021-11-22 DIAGNOSIS — C50211 Malignant neoplasm of upper-inner quadrant of right female breast: Secondary | ICD-10-CM | POA: Diagnosis not present

## 2021-11-22 DIAGNOSIS — Z510 Encounter for antineoplastic radiation therapy: Secondary | ICD-10-CM | POA: Diagnosis not present

## 2021-11-22 DIAGNOSIS — Z171 Estrogen receptor negative status [ER-]: Secondary | ICD-10-CM | POA: Diagnosis not present

## 2021-11-22 NOTE — Telephone Encounter (Signed)
[name] delivered [employer] Claims [employer] employee form for Long-term Disability to [facility] with a signed [facility] authorization for use and disclosure.  Patient request [place] send [place] beginning 06/06/2021 forward.  No provider statement. This nurse completed information regarding "Physicians and Hospitals" Section C for patient. Completed form successfully faxed to [phone] at 1500.  Original copy to alphabetical file folder behind appointment registration area one for patient pick up with next scheduled appointment.   Copy to "Record Release" bin in front office before Managed Care area for [place].I.M. staff to forward patient request for PHI selected by patient to [facility] ([place]) Information Management Office, Phone: [phone], Fax: [phone] to complete this process

## 2021-11-23 ENCOUNTER — Other Ambulatory Visit: Payer: Self-pay

## 2021-11-23 ENCOUNTER — Ambulatory Visit
Admission: RE | Admit: 2021-11-23 | Discharge: 2021-11-23 | Disposition: A | Discharge status: Home / Self Care | Payer: BC Managed Care – PPO | Source: Ambulatory Visit | Attending: Radiation Oncology | Admitting: Radiation Oncology

## 2021-11-23 DIAGNOSIS — C50211 Malignant neoplasm of upper-inner quadrant of right female breast: Secondary | ICD-10-CM | POA: Diagnosis not present

## 2021-11-23 DIAGNOSIS — Z510 Encounter for antineoplastic radiation therapy: Secondary | ICD-10-CM | POA: Diagnosis not present

## 2021-11-23 DIAGNOSIS — Z171 Estrogen receptor negative status [ER-]: Secondary | ICD-10-CM | POA: Diagnosis not present

## 2021-11-26 ENCOUNTER — Other Ambulatory Visit: Payer: Self-pay

## 2021-11-26 ENCOUNTER — Ambulatory Visit
Admission: RE | Admit: 2021-11-26 | Discharge: 2021-11-26 | Disposition: A | Discharge status: Home / Self Care | Payer: BC Managed Care – PPO | Source: Ambulatory Visit | Attending: Radiation Oncology | Admitting: Radiation Oncology

## 2021-11-26 DIAGNOSIS — Z510 Encounter for antineoplastic radiation therapy: Secondary | ICD-10-CM | POA: Diagnosis not present

## 2021-11-26 DIAGNOSIS — Z171 Estrogen receptor negative status [ER-]: Secondary | ICD-10-CM | POA: Diagnosis not present

## 2021-11-26 DIAGNOSIS — C50211 Malignant neoplasm of upper-inner quadrant of right female breast: Secondary | ICD-10-CM | POA: Diagnosis not present

## 2021-11-27 ENCOUNTER — Encounter: Payer: Self-pay | Admitting: Hematology and Oncology

## 2021-11-27 ENCOUNTER — Ambulatory Visit
Admission: RE | Admit: 2021-11-27 | Discharge: 2021-11-27 | Disposition: A | Discharge status: Home / Self Care | Payer: BC Managed Care – PPO | Source: Ambulatory Visit | Attending: Radiation Oncology | Admitting: Radiation Oncology

## 2021-11-27 ENCOUNTER — Other Ambulatory Visit: Payer: Self-pay | Admitting: Radiation Oncology

## 2021-11-27 DIAGNOSIS — C50211 Malignant neoplasm of upper-inner quadrant of right female breast: Secondary | ICD-10-CM | POA: Diagnosis not present

## 2021-11-27 DIAGNOSIS — Z510 Encounter for antineoplastic radiation therapy: Secondary | ICD-10-CM | POA: Diagnosis not present

## 2021-11-27 DIAGNOSIS — Z171 Estrogen receptor negative status [ER-]: Secondary | ICD-10-CM | POA: Diagnosis not present

## 2021-11-27 MED ORDER — TRAMADOL HCL 100 MG PO TABS: 100.0000 mg | ORAL_TABLET | Freq: Three times a day (TID) | ORAL | 0 refills | Status: DC

## 2021-11-28 ENCOUNTER — Other Ambulatory Visit: Payer: Self-pay

## 2021-11-28 ENCOUNTER — Ambulatory Visit
Admission: RE | Admit: 2021-11-28 | Discharge: 2021-11-28 | Disposition: A | Discharge status: Home / Self Care | Payer: BC Managed Care – PPO | Source: Ambulatory Visit | Attending: Radiation Oncology | Admitting: Radiation Oncology

## 2021-11-28 DIAGNOSIS — C50211 Malignant neoplasm of upper-inner quadrant of right female breast: Secondary | ICD-10-CM | POA: Diagnosis not present

## 2021-11-28 DIAGNOSIS — Z510 Encounter for antineoplastic radiation therapy: Secondary | ICD-10-CM | POA: Diagnosis not present

## 2021-11-28 DIAGNOSIS — Z171 Estrogen receptor negative status [ER-]: Secondary | ICD-10-CM | POA: Diagnosis not present

## 2021-11-29 ENCOUNTER — Ambulatory Visit
Admission: RE | Admit: 2021-11-29 | Discharge: 2021-11-29 | Disposition: A | Discharge status: Home / Self Care | Payer: BC Managed Care – PPO | Source: Ambulatory Visit | Attending: Radiation Oncology | Admitting: Radiation Oncology

## 2021-11-29 DIAGNOSIS — Z510 Encounter for antineoplastic radiation therapy: Secondary | ICD-10-CM | POA: Diagnosis not present

## 2021-11-29 DIAGNOSIS — Z171 Estrogen receptor negative status [ER-]: Secondary | ICD-10-CM | POA: Diagnosis not present

## 2021-11-29 DIAGNOSIS — C50211 Malignant neoplasm of upper-inner quadrant of right female breast: Secondary | ICD-10-CM | POA: Diagnosis not present

## 2021-11-30 ENCOUNTER — Ambulatory Visit
Admission: RE | Admit: 2021-11-30 | Discharge: 2021-11-30 | Disposition: A | Discharge status: Home / Self Care | Payer: BC Managed Care – PPO | Source: Ambulatory Visit | Attending: Radiation Oncology | Admitting: Radiation Oncology

## 2021-11-30 ENCOUNTER — Other Ambulatory Visit: Payer: Self-pay

## 2021-11-30 DIAGNOSIS — Z510 Encounter for antineoplastic radiation therapy: Secondary | ICD-10-CM | POA: Diagnosis not present

## 2021-11-30 DIAGNOSIS — Z171 Estrogen receptor negative status [ER-]: Secondary | ICD-10-CM | POA: Diagnosis not present

## 2021-11-30 DIAGNOSIS — C50211 Malignant neoplasm of upper-inner quadrant of right female breast: Secondary | ICD-10-CM | POA: Diagnosis not present

## 2021-12-03 ENCOUNTER — Other Ambulatory Visit: Payer: Self-pay

## 2021-12-03 ENCOUNTER — Ambulatory Visit
Admission: RE | Admit: 2021-12-03 | Discharge: 2021-12-03 | Disposition: A | Discharge status: Home / Self Care | Payer: BC Managed Care – PPO | Source: Ambulatory Visit | Attending: Radiation Oncology | Admitting: Radiation Oncology

## 2021-12-03 DIAGNOSIS — Z171 Estrogen receptor negative status [ER-]: Secondary | ICD-10-CM | POA: Diagnosis not present

## 2021-12-03 DIAGNOSIS — Z510 Encounter for antineoplastic radiation therapy: Secondary | ICD-10-CM | POA: Diagnosis not present

## 2021-12-03 DIAGNOSIS — C50211 Malignant neoplasm of upper-inner quadrant of right female breast: Secondary | ICD-10-CM | POA: Diagnosis not present

## 2021-12-04 ENCOUNTER — Ambulatory Visit
Admission: RE | Admit: 2021-12-04 | Discharge: 2021-12-04 | Disposition: A | Discharge status: Home / Self Care | Payer: BC Managed Care – PPO | Source: Ambulatory Visit | Attending: Radiation Oncology | Admitting: Radiation Oncology

## 2021-12-04 DIAGNOSIS — Z171 Estrogen receptor negative status [ER-]: Secondary | ICD-10-CM | POA: Diagnosis not present

## 2021-12-04 DIAGNOSIS — C50211 Malignant neoplasm of upper-inner quadrant of right female breast: Secondary | ICD-10-CM

## 2021-12-04 DIAGNOSIS — Z510 Encounter for antineoplastic radiation therapy: Secondary | ICD-10-CM | POA: Diagnosis not present

## 2021-12-04 MED ORDER — SONAFINE EX EMUL
1.0000 "application " | Freq: Once | CUTANEOUS | Status: AC
  Administered 2021-12-04: 1 via TOPICAL

## 2021-12-05 ENCOUNTER — Other Ambulatory Visit: Payer: Self-pay

## 2021-12-05 ENCOUNTER — Ambulatory Visit
Admission: RE | Admit: 2021-12-05 | Discharge: 2021-12-05 | Disposition: A | Discharge status: Home / Self Care | Payer: BC Managed Care – PPO | Source: Ambulatory Visit | Attending: Radiation Oncology | Admitting: Radiation Oncology

## 2021-12-05 ENCOUNTER — Telehealth: Payer: Self-pay

## 2021-12-05 DIAGNOSIS — Z171 Estrogen receptor negative status [ER-]: Secondary | ICD-10-CM | POA: Diagnosis not present

## 2021-12-05 DIAGNOSIS — C50211 Malignant neoplasm of upper-inner quadrant of right female breast: Secondary | ICD-10-CM | POA: Insufficient documentation

## 2021-12-05 DIAGNOSIS — Z79899 Other long term (current) drug therapy: Secondary | ICD-10-CM | POA: Diagnosis not present

## 2021-12-05 DIAGNOSIS — Z923 Personal history of irradiation: Secondary | ICD-10-CM | POA: Insufficient documentation

## 2021-12-05 DIAGNOSIS — Z510 Encounter for antineoplastic radiation therapy: Secondary | ICD-10-CM | POA: Insufficient documentation

## 2021-12-05 NOTE — Telephone Encounter (Signed)
Planned 6 wks of radiation therapy to end mid Feb. Recommend Prolia once she completes radiation therapy.

## 2021-12-05 NOTE — Telephone Encounter (Signed)
Noted thank you. Will reach out to the patient at that time.

## 2021-12-05 NOTE — Telephone Encounter (Signed)
Dr Darnell Level, I wanted to follow up on the patient and scheduling Prolia. Per chart she is going through radiation chemotherapy. How long should Prolia be post pone for before following up with patient? Any other suggestions

## 2021-12-06 ENCOUNTER — Ambulatory Visit
Admission: RE | Admit: 2021-12-06 | Discharge: 2021-12-06 | Disposition: A | Discharge status: Home / Self Care | Payer: BC Managed Care – PPO | Source: Ambulatory Visit | Attending: Radiation Oncology | Admitting: Radiation Oncology

## 2021-12-06 DIAGNOSIS — Z510 Encounter for antineoplastic radiation therapy: Secondary | ICD-10-CM | POA: Diagnosis not present

## 2021-12-06 DIAGNOSIS — C50211 Malignant neoplasm of upper-inner quadrant of right female breast: Secondary | ICD-10-CM | POA: Diagnosis not present

## 2021-12-06 DIAGNOSIS — Z923 Personal history of irradiation: Secondary | ICD-10-CM | POA: Diagnosis not present

## 2021-12-06 DIAGNOSIS — Z171 Estrogen receptor negative status [ER-]: Secondary | ICD-10-CM | POA: Diagnosis not present

## 2021-12-06 DIAGNOSIS — Z79899 Other long term (current) drug therapy: Secondary | ICD-10-CM | POA: Diagnosis not present

## 2021-12-07 ENCOUNTER — Other Ambulatory Visit: Payer: Self-pay

## 2021-12-07 ENCOUNTER — Ambulatory Visit
Admission: RE | Admit: 2021-12-07 | Discharge: 2021-12-07 | Disposition: A | Discharge status: Home / Self Care | Payer: BC Managed Care – PPO | Source: Ambulatory Visit | Attending: Radiation Oncology | Admitting: Radiation Oncology

## 2021-12-07 DIAGNOSIS — Z171 Estrogen receptor negative status [ER-]: Secondary | ICD-10-CM | POA: Diagnosis not present

## 2021-12-07 DIAGNOSIS — Z79899 Other long term (current) drug therapy: Secondary | ICD-10-CM | POA: Diagnosis not present

## 2021-12-07 DIAGNOSIS — Z510 Encounter for antineoplastic radiation therapy: Secondary | ICD-10-CM | POA: Diagnosis not present

## 2021-12-07 DIAGNOSIS — C50211 Malignant neoplasm of upper-inner quadrant of right female breast: Secondary | ICD-10-CM | POA: Diagnosis not present

## 2021-12-07 DIAGNOSIS — Z923 Personal history of irradiation: Secondary | ICD-10-CM | POA: Diagnosis not present

## 2021-12-08 NOTE — Progress Notes (Signed)
Patient Care Team: [name], MD as PCP - General (Family Medicine) Mauro Kaufmann, RN as Oncology Nurse Navigator Rockwell Germany, RN as Oncology Nurse Navigator  DIAGNOSIS:    ICD-10-CM   1. Malignant neoplasm of upper-inner quadrant of right breast in female, estrogen receptor negative ([place])  C50.211    Z17.1       SUMMARY OF ONCOLOGIC HISTORY: Oncology History  History of left breast cancer  06/05/2021 Initial Biopsy   History of left breast cancer status postlumpectomy 2003 Pain in the right breast: Mammogram and ultrasound revealed indeterminate masses in the right breast spanning 2.4 cm biopsy revealed grade 3 IDC ER 5%, PR 0%, Ki67 60%, HER2 negative   Pain of left breast  Malignant neoplasm of upper-inner quadrant of right breast in female, estrogen receptor negative ([place])  11/09/2020 -  Radiation Therapy   Adjuvant radiation   06/05/2021 Initial Biopsy   History of left breast cancer status postlumpectomy 2003 Pain in the right breast: Mammogram and ultrasound revealed indeterminate masses in the right breast spanning 2.4 cm biopsy revealed grade 3 IDC ER 5%, PR 0%, Ki67 60%, HER2 negative   06/11/2021 Cancer Staging   Staging form: Breast, AJCC 8th Edition - Clinical stage from 06/11/2021: Stage IIB (cT2, cN0, cM0, G3, ER-, PR-, HER2-) - Signed by Nicholas Lose, MD on 06/11/2021 Stage prefix: Initial diagnosis Histologic grading system: 3 grade system    06/19/2021 - 08/11/2021 Chemotherapy   Neoadjuvant chemotherapy: Taxotere and Cytoxan x4 cycles   10/05/2021 Surgery   Right lumpectomy: 0.4 cm grade 2 IDC with DCIS 0/6 LN Neg, ER 0%, PR 0%, Ki-67 5%, HER2 0   11/09/2021 - 12/24/2021 Radiation Therapy   Adjuvant radiation     CHIEF COMPLIANT: Follow-up of right breast cancer  INTERVAL HISTORY: Sabrina Wright is a 61 y.o. with above-mentioned history of right breast cancer having undergone chemotherapy with Taxotere and Cytoxan and right lumpectomy, currently on  radiation therapy. She presents to the clinic today for follow-up.   ALLERGIES:  is allergic to codeine, eggs or egg-derived products, and gadavist [gadobutrol].  MEDICATIONS:  Current Outpatient Medications  Medication Sig Dispense Refill   acetaminophen (TYLENOL) 325 MG tablet Take 2 tablets (650 mg total) by mouth every 6 (six) hours as needed for fever, headache or moderate pain.     ALPRAZolam (XANAX) 0.5 MG tablet TAKE 1/2 - 1 TABLETS (0.25-0.5 MG TOTAL) BY MOUTH AT BEDTIME. 30 tablet 1   Calcium Carb-Cholecalciferol (CALCIUM 600/VITAMIN D3) 600-800 MG-UNIT TABS Take 2 tablets by mouth daily. 60 tablet    Cholecalciferol (VITAMIN D) 50 MCG (2000 UT) CAPS Take 1 capsule (2,000 Units total) by mouth daily. 30 capsule    citalopram (CELEXA) 20 MG tablet TAKE 1 TABLET BY MOUTH EVERY DAY 90 tablet 1   clobetasol cream (TEMOVATE) 3.66 % APPLY 1 APPLICATION TOPICALLY 2 (TWO) TIMES DAILY. APPLY TO AFFECTED AREA NO MORE THAN 2 WEEKS AT A TIME+ (Patient not taking: Reported on 10/31/2021) 30 g 0   denosumab (PROLIA) 60 MG/ML SOSY injection INJECT 60MG SUBCUTANEOUSLY  EVERY 6 MONTHS (GIVEN AT  PRESCRIBERS OFFICE) (Patient taking differently: Inject 60 mg into the skin every 6 (six) months. INJECT 60MG SUBCUTANEOUSLY  EVERY 6 MONTHS (GIVEN AT  PRESCRIBERS OFFICE)) 1 mL 1   dexamethasone (DECADRON) 4 MG tablet Take 1 tablet (4 mg total) by mouth daily. Start the day before Taxotere. Then again the day after chemo with food (Patient not taking: Reported on  10/31/2021) 12 tablet 0   lidocaine-prilocaine (EMLA) cream Apply to affected area once 30 g 3   loratadine (CLARITIN) 10 MG tablet Take 10 mg by mouth as needed for allergies. (Patient not taking: Reported on 10/31/2021)     meloxicam (MOBIC) 7.5 MG tablet TAKE 1 TABLET BY MOUTH EVERY DAY 30 tablet 6   Multiple Vitamin (MULTIVITAMIN WITH MINERALS) TABS tablet Take 1 tablet by mouth at bedtime.     ondansetron (ZOFRAN) 8 MG tablet TAKE 1 TABLET BY MOUTH  TWICE A DAY AS NEEDED FOR REFRACTORY NAUSEA/VOMITING START ON DAY 3 AFTER CHEMO 30 tablet 1   prochlorperazine (COMPAZINE) 10 MG tablet Take 1 tablet (10 mg total) by mouth every 6 (six) hours as needed (Nausea or vomiting). (Patient not taking: Reported on 06/25/2021) 30 tablet 1   SUMAtriptan (IMITREX) 100 MG tablet TAKE 1 TABLET BY MOUTH ONCE FOR 1 DOSE. MAY REPEAT IN 2 HOURS IF HEADACHE PERSISTS OR RECURS. 9 tablet 3   traMADol (ULTRAM) 50 MG tablet Take 1 tablet (50 mg total) by mouth every 6 (six) hours as needed. 60 tablet 1   traMADol HCl 100 MG TABS Take 100 mg by mouth 3 (three) times daily. 90 tablet 0   vitamin B-12 (V-R VITAMIN B-12) 500 MCG tablet Take 1 tablet (500 mcg total) by mouth daily.     No current facility-administered medications for this visit.    PHYSICAL EXAMINATION: ECOG PERFORMANCE STATUS: 1 - Symptomatic but completely ambulatory  Vitals:   12/10/21 0812  BP: (!) 158/88  Pulse: 74  Resp: 18  Temp: 97.8 F (36.6 C)  SpO2: 97%   Filed Weights   12/10/21 0812  Weight: 178 lb 12.8 oz (81.1 kg)    BREAST: No palpable masses or nodules in either right or left breasts. No palpable axillary supraclavicular or infraclavicular adenopathy no breast tenderness or nipple discharge. (exam performed in the presence of a chaperone)  LABORATORY DATA:  I have reviewed the data as listed CMP Latest Ref Rng & Units 08/21/2021 07/31/2021 07/10/2021  Glucose 70 - 99 mg/dL 145(H) 81 89  BUN 6 - 20 mg/dL _0 Creatinine 0.44 - 1.00 mg/dL 0.76 0.65 0.71  Sodium 135 - 145 mmol/L 137 138 139  Potassium 3.5 - 5.1 mmol/L 4.1 3.9 4.1  Chloride 98 - 111 mmol/L 103 105 105  CO2 22 - 32 mmol/L 21(L) 24 24  Calcium 8.9 - 10.3 mg/dL 9.7 9.4 10.0  Total Protein 6.5 - 8.1 g/dL 7.2 6.8 7.2  Total Bilirubin 0.3 - 1.2 mg/dL 0.3 0.2(L) 0.4  Alkaline Phos 38 - 126 U/L 65 64 70  AST 15 - 41 U/L _1 ALT 0 - 44 U/L _2 Lab Results  Component Value Date   WBC 17.9 (H)  08/21/2021   HGB 11.9 (L) 08/21/2021   HCT 34.8 (L) 08/21/2021   MCV 85.3 08/21/2021   PLT 439 (H) 08/21/2021   NEUTROABS 16.7 (H) 08/21/2021    ASSESSMENT & PLAN:  Malignant neoplasm of upper-inner quadrant of right breast in female, estrogen receptor negative ([place]) History of left breast cancer status postlumpectomy 2003 status post chemo with Adriamycin, radiation 06/05/2021: Pain in the right breast: Mammogram and ultrasound revealed indeterminate masses in the right breast spanning 2.4 cm biopsy revealed grade 3 IDC ER 5%, PR 0%, 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  Neoadjuvant chemotherapy with Taxotere and Cytoxan x4 (06/19/21- 08/21/21) 2.  10/05/2021: Breast conserving surgery with sentinel lymph node biopsy: 0.4 cm grade 2 IDC with DCIS 0/6 LN Neg, ER 0%, PR 0%, Ki-67 5%, HER2 0  3.  Adjuvant radiation therapy 11/09/2021-12/24/2021 4.  Based on final pathology being ER negative, I did not recommend antiestrogen therapy. ----------------------------------------------------------------------------------------------------------------------- Discussed with the patient that since radiation will be completed on 12/24/2021, there is no additional role of systemic antiestrogen therapy given the fact that she was ER/PR negative. We briefly discussed the role of Xeloda and its risks and benefits.  She will take this 14 days on and 1 week off at 1000 mg p.o. twice daily dosing I recommend starting capecitabine 01/02/2022.  She tells me that her disability ended as of now and therefore she will not have any further income.  She is applied for insurance through [place] care.  Return to clinic mid March for labs and follow-up to see how she is tolerating capecitabine.  No orders of the defined types were placed in this encounter.  The patient has a good understanding of the overall plan. she agrees with it. she will call with any problems that may develop before the next visit here.  Total  time spent: 30 mins including face to face time and time spent for planning, charting and coordination of care  Rulon Eisenmenger, MD, MPH 12/10/2021  I, Thana Ates, am acting as scribe for Dr. Nicholas Lose.  I have reviewed the above documentation for accuracy and completeness, and I agree with the above.

## 2021-12-10 ENCOUNTER — Telehealth: Payer: Self-pay

## 2021-12-10 ENCOUNTER — Ambulatory Visit
Admission: RE | Admit: 2021-12-10 | Discharge: 2021-12-10 | Disposition: A | Discharge status: Home / Self Care | Payer: BC Managed Care – PPO | Source: Ambulatory Visit | Attending: Radiation Oncology | Admitting: Radiation Oncology

## 2021-12-10 ENCOUNTER — Encounter: Payer: Self-pay | Admitting: Hematology and Oncology

## 2021-12-10 ENCOUNTER — Inpatient Hospital Stay (HOSPITAL_BASED_OUTPATIENT_CLINIC_OR_DEPARTMENT_OTHER): Payer: BC Managed Care – PPO | Admitting: Hematology and Oncology

## 2021-12-10 ENCOUNTER — Other Ambulatory Visit (HOSPITAL_COMMUNITY): Payer: Self-pay

## 2021-12-10 ENCOUNTER — Other Ambulatory Visit: Payer: Self-pay

## 2021-12-10 DIAGNOSIS — C50211 Malignant neoplasm of upper-inner quadrant of right female breast: Secondary | ICD-10-CM

## 2021-12-10 DIAGNOSIS — Z923 Personal history of irradiation: Secondary | ICD-10-CM | POA: Insufficient documentation

## 2021-12-10 DIAGNOSIS — Z171 Estrogen receptor negative status [ER-]: Secondary | ICD-10-CM | POA: Insufficient documentation

## 2021-12-10 DIAGNOSIS — Z79899 Other long term (current) drug therapy: Secondary | ICD-10-CM | POA: Insufficient documentation

## 2021-12-10 DIAGNOSIS — Z510 Encounter for antineoplastic radiation therapy: Secondary | ICD-10-CM | POA: Diagnosis not present

## 2021-12-10 MED ORDER — CAPECITABINE 500 MG PO TABS: 1000.0000 mg | ORAL_TABLET | Freq: Two times a day (BID) | ORAL | 7 refills | Status: DC

## 2021-12-10 MED ORDER — CAPECITABINE 500 MG PO TABS
1000.0000 mg | ORAL_TABLET | Freq: Two times a day (BID) | ORAL | 7 refills | Status: DC
  Filled 2021-12-10: qty 56, 14d supply, fill #0

## 2021-12-10 NOTE — Telephone Encounter (Signed)
Oral Oncology Pharmacist Encounter  Received new prescription for capecitabine (Xeloda) for the treatment of hormone receptor negative, HER2 negative breast cancer, planned duration until disease progression or unacceptable toxicity. Prescription dose and frequency assessed.   Labs from 08/21/21 assessed, no interventions needed.  Current medication list in Epic reviewed, DDIs with xeloda identified: - Citalopram: monitor for Qtc prolongation when combined with xeloda (patients EKG on 11/15/20 patient had prolonged QT- will discuss with MD) - antacids: can increase xeloda concentration (cat b) no need for intervention  Evaluated chart and no patient barriers to medication adherence noted.   Patient agreement for treatment documented in MD note on 12/10/2021.  Prescription has been e-scribed to the [facility] for benefits analysis and approval.  Oral Oncology Clinic will continue to follow for insurance authorization, copayment issues, initial counseling and start date.  Drema Halon, PharmD Hematology/Oncology Clinical Pharmacist [facility] [phone] 12/10/2021 2:38 PM

## 2021-12-10 NOTE — Telephone Encounter (Signed)
Oral Oncology Patient Advocate Encounter  Prior Authorization for Xeloda has been approved.    PA# [id number] Effective dates: 12/10/21 through 12/10/22  Patient must fill at [facility] will continue to follow.   [place] Patient [place] Phone [phone] Fax [phone] 12/10/2021 11:33 AM

## 2021-12-10 NOTE — Telephone Encounter (Signed)
Oral Oncology Patient Advocate Encounter   Received notification from [employer] that prior authorization for Xeloda is required.   PA submitted on CoverMyMeds Key [id number] Status is pending   Oral Oncology Clinic will continue to follow.   [place] Patient [place] Phone [phone] Fax [phone] 12/10/2021 9:58 AM

## 2021-12-10 NOTE — Assessment & Plan Note (Signed)
History of left breast cancer status postlumpectomy 2003status post chemo with Adriamycin, radiation 06/05/2021:Pain in the right breast: Mammogram and ultrasound revealed indeterminate masses in the right breast spanning 2.4 cm biopsy revealed grade 3 IDC ER 5%, PR 0%, Ki67 60%, HER2 negative  Treatment plan: 1.Neoadjuvant chemotherapy with Taxotereand Cytoxanx4 (06/19/21- 08/21/21) 2.10/05/2021: Breast conserving surgery with sentinel lymph node biopsy: 0.4 cm grade 2 IDC with DCIS 0/6 LN Neg, ER 0%, PR 0%, Ki-67 5%, HER2 0  3.Adjuvant radiation therapy 11/09/2021-12/24/2021 4.Based on final pathology being ER negative, I did not recommend antiestrogen therapy. ----------------------------------------------------------------------------------------------------------------------- Discussed with the patient that since radiation will be completed on 12/24/2021, there is no additional role of systemic antiestrogen therapy given the fact that she was ER/PR negative. We briefly discussed the role of Xeloda and its risks and benefits.

## 2021-12-11 ENCOUNTER — Ambulatory Visit
Admission: RE | Admit: 2021-12-11 | Discharge: 2021-12-11 | Disposition: A | Discharge status: Home / Self Care | Payer: BC Managed Care – PPO | Source: Ambulatory Visit | Attending: Radiation Oncology | Admitting: Radiation Oncology

## 2021-12-11 ENCOUNTER — Ambulatory Visit: Payer: BC Managed Care – PPO | Admitting: Radiation Oncology

## 2021-12-11 DIAGNOSIS — Z923 Personal history of irradiation: Secondary | ICD-10-CM | POA: Diagnosis not present

## 2021-12-11 DIAGNOSIS — Z79899 Other long term (current) drug therapy: Secondary | ICD-10-CM | POA: Diagnosis not present

## 2021-12-11 DIAGNOSIS — Z171 Estrogen receptor negative status [ER-]: Secondary | ICD-10-CM | POA: Diagnosis not present

## 2021-12-11 DIAGNOSIS — C50211 Malignant neoplasm of upper-inner quadrant of right female breast: Secondary | ICD-10-CM | POA: Diagnosis not present

## 2021-12-11 DIAGNOSIS — Z510 Encounter for antineoplastic radiation therapy: Secondary | ICD-10-CM | POA: Diagnosis not present

## 2021-12-12 ENCOUNTER — Ambulatory Visit
Admission: RE | Admit: 2021-12-12 | Discharge: 2021-12-12 | Disposition: A | Discharge status: Home / Self Care | Payer: BC Managed Care – PPO | Source: Ambulatory Visit | Attending: Radiation Oncology | Admitting: Radiation Oncology

## 2021-12-12 ENCOUNTER — Other Ambulatory Visit: Payer: Self-pay

## 2021-12-12 DIAGNOSIS — Z79899 Other long term (current) drug therapy: Secondary | ICD-10-CM | POA: Diagnosis not present

## 2021-12-12 DIAGNOSIS — Z923 Personal history of irradiation: Secondary | ICD-10-CM | POA: Diagnosis not present

## 2021-12-12 DIAGNOSIS — C50211 Malignant neoplasm of upper-inner quadrant of right female breast: Secondary | ICD-10-CM | POA: Diagnosis not present

## 2021-12-12 DIAGNOSIS — Z510 Encounter for antineoplastic radiation therapy: Secondary | ICD-10-CM | POA: Diagnosis not present

## 2021-12-12 DIAGNOSIS — Z171 Estrogen receptor negative status [ER-]: Secondary | ICD-10-CM | POA: Diagnosis not present
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
## 2021-12-13 ENCOUNTER — Inpatient Hospital Stay (HOSPITAL_BASED_OUTPATIENT_CLINIC_OR_DEPARTMENT_OTHER): Payer: BC Managed Care – PPO | Admitting: Hematology and Oncology

## 2021-12-13 ENCOUNTER — Ambulatory Visit
Admission: RE | Admit: 2021-12-13 | Discharge: 2021-12-13 | Disposition: A | Discharge status: Home / Self Care | Payer: BC Managed Care – PPO | Source: Ambulatory Visit | Attending: Radiation Oncology | Admitting: Radiation Oncology

## 2021-12-13 ENCOUNTER — Other Ambulatory Visit: Payer: Self-pay | Admitting: Pharmacist

## 2021-12-13 DIAGNOSIS — Z171 Estrogen receptor negative status [ER-]: Secondary | ICD-10-CM | POA: Diagnosis not present

## 2021-12-13 DIAGNOSIS — Z510 Encounter for antineoplastic radiation therapy: Secondary | ICD-10-CM | POA: Diagnosis not present

## 2021-12-13 DIAGNOSIS — C50211 Malignant neoplasm of upper-inner quadrant of right female breast: Secondary | ICD-10-CM

## 2021-12-13 DIAGNOSIS — Z923 Personal history of irradiation: Secondary | ICD-10-CM | POA: Diagnosis not present

## 2021-12-13 DIAGNOSIS — Z79899 Other long term (current) drug therapy: Secondary | ICD-10-CM | POA: Diagnosis not present

## 2021-12-13 NOTE — Assessment & Plan Note (Signed)
History of left breast cancer status postlumpectomy 2003status post chemo with Adriamycin, radiation 06/05/2021:Pain in the right breast: Mammogram and ultrasound revealed indeterminate masses in the right breast spanning 2.4 cm biopsy revealed grade 3 IDC ER 5%, PR 0%, Ki67 60%, HER2 negative  Treatment plan: 1.Neoadjuvant chemotherapy with Taxotereand Cytoxanx4(06/19/21- 08/21/21) 2.10/05/2021: Breast conserving surgery with sentinel lymph node biopsy: 0.4 cm grade 2 IDC with DCIS 0/6 LN Neg, ER 0%, PR 0%, Ki-67 5%, HER2 0  3.Adjuvant radiation therapy 11/09/2021-12/24/2021 4.Based on final pathology being ER negative, I did not recommend antiestrogen therapy. ----------------------------------------------------------------------------------------------------------------------- Discussed with the patient that since radiation will be completed on 12/24/2021, there is no additional role of systemic antiestrogen therapy given the fact that she was ER/PR negative. We briefly discussed the role of Xeloda and its risks and benefits.  She will take this 14 days on and 1 week off at 1000 mg p.o. twice daily dosing I recommend starting capecitabine 01/02/2022.  She tells me that her disability ended as of now and therefore she will not have any further income.  She is applied for insurance through [facility] care.  Return to clinic mid March for labs and follow-up to see how she is tolerating capecitabine.

## 2021-12-14 ENCOUNTER — Other Ambulatory Visit: Payer: Self-pay

## 2021-12-14 ENCOUNTER — Ambulatory Visit
Admission: RE | Admit: 2021-12-14 | Discharge: 2021-12-14 | Disposition: A | Discharge status: Home / Self Care | Payer: BC Managed Care – PPO | Source: Ambulatory Visit | Attending: Radiation Oncology | Admitting: Radiation Oncology

## 2021-12-14 DIAGNOSIS — C50211 Malignant neoplasm of upper-inner quadrant of right female breast: Secondary | ICD-10-CM | POA: Diagnosis not present

## 2021-12-14 DIAGNOSIS — Z171 Estrogen receptor negative status [ER-]: Secondary | ICD-10-CM | POA: Diagnosis not present

## 2021-12-14 DIAGNOSIS — Z510 Encounter for antineoplastic radiation therapy: Secondary | ICD-10-CM | POA: Diagnosis not present

## 2021-12-14 DIAGNOSIS — Z923 Personal history of irradiation: Secondary | ICD-10-CM | POA: Diagnosis not present

## 2021-12-14 DIAGNOSIS — Z79899 Other long term (current) drug therapy: Secondary | ICD-10-CM | POA: Diagnosis not present

## 2021-12-17 ENCOUNTER — Ambulatory Visit
Admission: RE | Admit: 2021-12-17 | Discharge: 2021-12-17 | Disposition: A | Discharge status: Home / Self Care | Payer: BC Managed Care – PPO | Source: Ambulatory Visit | Attending: Radiation Oncology | Admitting: Radiation Oncology

## 2021-12-17 ENCOUNTER — Other Ambulatory Visit: Payer: Self-pay

## 2021-12-17 DIAGNOSIS — Z171 Estrogen receptor negative status [ER-]: Secondary | ICD-10-CM | POA: Diagnosis not present

## 2021-12-17 DIAGNOSIS — Z79899 Other long term (current) drug therapy: Secondary | ICD-10-CM | POA: Diagnosis not present

## 2021-12-17 DIAGNOSIS — Z510 Encounter for antineoplastic radiation therapy: Secondary | ICD-10-CM | POA: Diagnosis not present

## 2021-12-17 DIAGNOSIS — Z923 Personal history of irradiation: Secondary | ICD-10-CM | POA: Diagnosis not present

## 2021-12-17 DIAGNOSIS — C50211 Malignant neoplasm of upper-inner quadrant of right female breast: Secondary | ICD-10-CM | POA: Diagnosis not present

## 2021-12-18 ENCOUNTER — Other Ambulatory Visit: Payer: Self-pay | Admitting: Radiation Oncology

## 2021-12-18 ENCOUNTER — Ambulatory Visit
Admission: RE | Admit: 2021-12-18 | Discharge: 2021-12-18 | Disposition: A | Discharge status: Home / Self Care | Payer: BC Managed Care – PPO | Source: Ambulatory Visit | Attending: Radiation Oncology | Admitting: Radiation Oncology

## 2021-12-18 DIAGNOSIS — Z171 Estrogen receptor negative status [ER-]: Secondary | ICD-10-CM | POA: Diagnosis not present

## 2021-12-18 DIAGNOSIS — Z79899 Other long term (current) drug therapy: Secondary | ICD-10-CM | POA: Diagnosis not present

## 2021-12-18 DIAGNOSIS — Z923 Personal history of irradiation: Secondary | ICD-10-CM | POA: Diagnosis not present

## 2021-12-18 DIAGNOSIS — Z510 Encounter for antineoplastic radiation therapy: Secondary | ICD-10-CM | POA: Diagnosis not present

## 2021-12-18 DIAGNOSIS — C50211 Malignant neoplasm of upper-inner quadrant of right female breast: Secondary | ICD-10-CM | POA: Diagnosis not present

## 2021-12-18 DIAGNOSIS — S93402A Sprain of unspecified ligament of left ankle, initial encounter: Secondary | ICD-10-CM | POA: Diagnosis not present

## 2021-12-18 MED ORDER — ALPRAZOLAM 0.5 MG PO TABS: ORAL_TABLET | ORAL | 0 refills | Status: DC

## 2021-12-19 ENCOUNTER — Ambulatory Visit
Admission: RE | Admit: 2021-12-19 | Discharge: 2021-12-19 | Disposition: A | Discharge status: Home / Self Care | Payer: BC Managed Care – PPO | Source: Ambulatory Visit | Attending: Radiation Oncology | Admitting: Radiation Oncology

## 2021-12-19 ENCOUNTER — Other Ambulatory Visit: Payer: Self-pay | Admitting: *Deleted

## 2021-12-19 ENCOUNTER — Other Ambulatory Visit: Payer: Self-pay

## 2021-12-19 DIAGNOSIS — Z171 Estrogen receptor negative status [ER-]: Secondary | ICD-10-CM | POA: Diagnosis not present

## 2021-12-19 DIAGNOSIS — Z923 Personal history of irradiation: Secondary | ICD-10-CM | POA: Diagnosis not present

## 2021-12-19 DIAGNOSIS — C50211 Malignant neoplasm of upper-inner quadrant of right female breast: Secondary | ICD-10-CM

## 2021-12-19 DIAGNOSIS — Z79899 Other long term (current) drug therapy: Secondary | ICD-10-CM | POA: Diagnosis not present

## 2021-12-19 DIAGNOSIS — Z510 Encounter for antineoplastic radiation therapy: Secondary | ICD-10-CM | POA: Diagnosis not present

## 2021-12-19 MED ORDER — CAPECITABINE 500 MG PO TABS: 1000.0000 mg | ORAL_TABLET | Freq: Two times a day (BID) | ORAL | 7 refills | Status: DC

## 2021-12-20 ENCOUNTER — Ambulatory Visit
Admission: RE | Admit: 2021-12-20 | Discharge: 2021-12-20 | Disposition: A | Discharge status: Home / Self Care | Payer: BC Managed Care – PPO | Source: Ambulatory Visit | Attending: Radiation Oncology | Admitting: Radiation Oncology

## 2021-12-20 DIAGNOSIS — Z79899 Other long term (current) drug therapy: Secondary | ICD-10-CM | POA: Diagnosis not present

## 2021-12-20 DIAGNOSIS — Z923 Personal history of irradiation: Secondary | ICD-10-CM | POA: Diagnosis not present

## 2021-12-20 DIAGNOSIS — C50211 Malignant neoplasm of upper-inner quadrant of right female breast: Secondary | ICD-10-CM | POA: Diagnosis not present

## 2021-12-20 DIAGNOSIS — Z171 Estrogen receptor negative status [ER-]: Secondary | ICD-10-CM | POA: Diagnosis not present

## 2021-12-20 DIAGNOSIS — Z510 Encounter for antineoplastic radiation therapy: Secondary | ICD-10-CM | POA: Diagnosis not present

## 2021-12-21 ENCOUNTER — Ambulatory Visit
Admission: RE | Admit: 2021-12-21 | Discharge: 2021-12-21 | Disposition: A | Discharge status: Home / Self Care | Payer: BC Managed Care – PPO | Source: Ambulatory Visit | Attending: Radiation Oncology | Admitting: Radiation Oncology

## 2021-12-21 ENCOUNTER — Other Ambulatory Visit: Payer: Self-pay

## 2021-12-21 DIAGNOSIS — C50211 Malignant neoplasm of upper-inner quadrant of right female breast: Secondary | ICD-10-CM | POA: Diagnosis not present

## 2021-12-21 DIAGNOSIS — Z510 Encounter for antineoplastic radiation therapy: Secondary | ICD-10-CM | POA: Diagnosis not present

## 2021-12-21 DIAGNOSIS — Z79899 Other long term (current) drug therapy: Secondary | ICD-10-CM | POA: Diagnosis not present

## 2021-12-21 DIAGNOSIS — Z923 Personal history of irradiation: Secondary | ICD-10-CM | POA: Diagnosis not present

## 2021-12-21 DIAGNOSIS — Z171 Estrogen receptor negative status [ER-]: Secondary | ICD-10-CM | POA: Diagnosis not present

## 2021-12-24 ENCOUNTER — Encounter: Payer: Self-pay | Admitting: Radiation Oncology

## 2021-12-24 ENCOUNTER — Ambulatory Visit
Admission: RE | Admit: 2021-12-24 | Discharge: 2021-12-24 | Disposition: A | Discharge status: Home / Self Care | Payer: BC Managed Care – PPO | Source: Ambulatory Visit | Attending: Radiation Oncology | Admitting: Radiation Oncology

## 2021-12-24 ENCOUNTER — Encounter: Payer: Self-pay | Admitting: *Deleted

## 2021-12-24 ENCOUNTER — Other Ambulatory Visit: Payer: Self-pay

## 2021-12-24 DIAGNOSIS — Z510 Encounter for antineoplastic radiation therapy: Secondary | ICD-10-CM | POA: Diagnosis not present

## 2021-12-24 DIAGNOSIS — Z171 Estrogen receptor negative status [ER-]: Secondary | ICD-10-CM | POA: Diagnosis not present

## 2021-12-24 DIAGNOSIS — C50211 Malignant neoplasm of upper-inner quadrant of right female breast: Secondary | ICD-10-CM | POA: Diagnosis not present

## 2021-12-24 DIAGNOSIS — Z923 Personal history of irradiation: Secondary | ICD-10-CM | POA: Diagnosis not present

## 2021-12-24 DIAGNOSIS — Z79899 Other long term (current) drug therapy: Secondary | ICD-10-CM | POA: Diagnosis not present

## 2021-12-27 ENCOUNTER — Encounter: Payer: Self-pay | Admitting: Hematology and Oncology

## 2021-12-27 NOTE — Progress Notes (Signed)
Called pt to discuss the [facility] to help with her OOP cost for Xeloda.  Pt would like to apply so she will provide her proof of income.  If approved I will give her an expense sheet and my card for any questions or concerns she may have in the future.

## 2021-12-31 ENCOUNTER — Other Ambulatory Visit: Payer: Self-pay | Admitting: *Deleted

## 2021-12-31 ENCOUNTER — Encounter: Payer: Self-pay | Admitting: Hematology and Oncology

## 2021-12-31 ENCOUNTER — Other Ambulatory Visit (HOSPITAL_COMMUNITY): Payer: Self-pay

## 2021-12-31 DIAGNOSIS — C50211 Malignant neoplasm of upper-inner quadrant of right female breast: Secondary | ICD-10-CM

## 2021-12-31 MED ORDER — CAPECITABINE 500 MG PO TABS
1000.0000 mg | ORAL_TABLET | Freq: Two times a day (BID) | ORAL | 7 refills | Status: DC
  Filled 2021-12-31 (×2): qty 56, 14d supply, fill #0
  Filled 2022-01-17: qty 56, 14d supply, fill #1

## 2021-12-31 NOTE — Telephone Encounter (Signed)
Oral Chemotherapy Pharmacist Encounter  I spoke with patient for overview of: Xeloda (capecitabine) for the  treatment of triple negative breast cancer, planned duration until disease progression or unacceptable drug toxicity.  Counseled patient on administration, dosing, side effects, monitoring, drug-food interactions, safe handling, storage, and disposal.  Patient will take Xeloda 500mg  tablets, 2 tablets (1000mg ) by mouth in AM and 2 tabs (1000mg ) by mouth in PM, within 30 minutes of finishing meals, for 14 days on, 7 days off, repeated every 21 days.  Xeloda start date: 01/02/22  Adverse effects include but are not limited to: fatigue, decreased blood counts, GI upset, diarrhea, mouth sores, and hand-foot syndrome.  Patient has anti-emetic on hand and knows to take it if nausea develops.   Patient will obtain anti diarrheal and alert the office of 4 or more loose stools above baseline.  Patient had EKG in office prior to starting the xeloda. New EKG showed that QTc was not not prolonged which was reviewed with MD. Patient will get a follow up EKG in the future.  Reviewed with patient importance of keeping a medication schedule and plan for any missed doses. No barriers to medication adherence identified.  Medication reconciliation performed and medication/allergy list updated.  This will ship from the [facility] on 12/31/21 to deliver to patient's home on 01/01/22.  Patient informed the pharmacy will reach out 5-7 days prior to needing next fill of Xeloda to coordinate continued medication acquisition to prevent break in therapy.  All questions answered.  Sabrina Wright voiced understanding and appreciation.   Medication education handout placed in mail for patient. Patient knows to call the office with questions or concerns. Oral Chemotherapy Clinic phone number provided to patient.   Drema Halon, PharmD Hematology/Oncology Clinical Pharmacist [facility]  [facility] [phone]

## 2021-12-31 NOTE — Progress Notes (Signed)
Pt is approved for the $1000 [employer] grant.  

## 2022-01-01 ENCOUNTER — Other Ambulatory Visit: Payer: Self-pay | Admitting: Radiation Oncology

## 2022-01-01 ENCOUNTER — Encounter (HOSPITAL_COMMUNITY): Payer: Self-pay

## 2022-01-01 MED ORDER — TRAMADOL HCL 100 MG PO TABS: 100.0000 mg | ORAL_TABLET | Freq: Three times a day (TID) | ORAL | 0 refills | Status: DC

## 2022-01-08 ENCOUNTER — Other Ambulatory Visit (HOSPITAL_COMMUNITY): Payer: Self-pay

## 2022-01-11 DIAGNOSIS — C50411 Malignant neoplasm of upper-outer quadrant of right female breast: Secondary | ICD-10-CM | POA: Diagnosis not present

## 2022-01-15 ENCOUNTER — Encounter: Payer: Self-pay | Admitting: Hematology and Oncology

## 2022-01-15 ENCOUNTER — Ambulatory Visit (INDEPENDENT_AMBULATORY_CARE_PROVIDER_SITE_OTHER)
Admission: RE | Admit: 2022-01-15 | Discharge: 2022-01-15 | Disposition: A | Discharge status: Home / Self Care | Payer: BC Managed Care – PPO | Source: Ambulatory Visit | Attending: Nurse Practitioner | Admitting: Nurse Practitioner

## 2022-01-15 ENCOUNTER — Other Ambulatory Visit: Payer: Self-pay

## 2022-01-15 ENCOUNTER — Encounter: Payer: Self-pay | Admitting: Nurse Practitioner

## 2022-01-15 ENCOUNTER — Ambulatory Visit (INDEPENDENT_AMBULATORY_CARE_PROVIDER_SITE_OTHER): Payer: BC Managed Care – PPO | Admitting: Nurse Practitioner

## 2022-01-15 VITALS — BP 144/81 | HR 73 | Temp 98.0°F | Resp 18 | Ht 64.0 in | Wt 179.3 lb

## 2022-01-15 DIAGNOSIS — S99922S Unspecified injury of left foot, sequela: Secondary | ICD-10-CM | POA: Insufficient documentation

## 2022-01-15 DIAGNOSIS — M79672 Pain in left foot: Secondary | ICD-10-CM | POA: Insufficient documentation

## 2022-01-15 DIAGNOSIS — F418 Other specified anxiety disorders: Secondary | ICD-10-CM | POA: Diagnosis not present

## 2022-01-15 DIAGNOSIS — M7732 Calcaneal spur, left foot: Secondary | ICD-10-CM | POA: Diagnosis not present

## 2022-01-15 DIAGNOSIS — M25572 Pain in left ankle and joints of left foot: Secondary | ICD-10-CM | POA: Diagnosis not present

## 2022-01-15 LAB — COMPREHENSIVE METABOLIC PANEL
ALT: 14 U/L (ref 0–35)
AST: 20 U/L (ref 0–37)
Albumin: 4.2 g/dL (ref 3.5–5.2)
Alkaline Phosphatase: 103 U/L (ref 39–117)
BUN: 10 mg/dL (ref 6–23)
CO2: 28 mEq/L (ref 19–32)
Calcium: 9.6 mg/dL (ref 8.4–10.5)
Chloride: 101 mEq/L (ref 96–112)
Creatinine, Ser: 0.81 mg/dL (ref 0.40–1.20)
GFR: 78.62 mL/min (ref >60.00)
Glucose, Bld: 89 mg/dL (ref 70–99)
Potassium: 4.1 mEq/L (ref 3.5–5.1)
Sodium: 136 mEq/L (ref 135–145)
Total Bilirubin: 0.5 mg/dL (ref 0.2–1.2)
Total Protein: 6.5 g/dL (ref 6.0–8.3)

## 2022-01-15 LAB — CBC
HCT: 38.6 % (ref 36.0–46.0)
Hemoglobin: 12.8 g/dL (ref 12.0–15.0)
MCHC: 33.2 g/dL (ref 30.0–36.0)
MCV: 86 fl (ref 78.0–100.0)
Platelets: 331 10*3/uL (ref 150.0–400.0)
RBC: 4.48 Mil/uL (ref 3.87–5.11)
RDW: 13.8 % (ref 11.5–15.5)
WBC: 7.4 10*3/uL (ref 4.0–10.5)

## 2022-01-15 LAB — URIC ACID: Uric Acid, Serum: 5.2 mg/dL (ref 2.4–7.0)

## 2022-01-15 MED ORDER — DULOXETINE HCL 30 MG PO CPEP: 30.0000 mg | ORAL_CAPSULE | Freq: Every day | ORAL | 0 refills | Status: DC

## 2022-01-15 NOTE — Assessment & Plan Note (Signed)
She has been on sertraline in the past.  Currently maintained on citalopram 20 mg daily.  Patient states medication is not as effective as it has been in the past.  She is In[place] undergoing current cancer treatment and states that that is overwhelming.  Also states that her husband had to go back to work recently.  We did discuss we will wean her off citalopram going half a tablet (total of 10 mg) for 1 week and then transition over to duloxetine 30 mg daily.  She will follow-up in 6 to 8 weeks with Dr. Darnell Level for reassessment and possible increase in medication.  Patient denies SI/HI/AVH. ?
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
## 2022-01-15 NOTE — Assessment & Plan Note (Signed)
Has been going on approximately 1 month.  Patient denies any history of gout.  Recent injury approximate 1 month ago.  No ecchymosis noted on exam pending lab results and x-ray. ?
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
## 2022-01-15 NOTE — Patient Instructions (Signed)
Take the Citalopram 0.5 tablet for a week (total of '10mg'$ ) after the week you can switch over to the duloxetine (cymbalta) ?I will be in touch in regards to the labs and xrays ?Follow up with Dr. Danise Mina in 6-8 weeks. ? ?"988" Behavioral health emergency line if you need it ?

## 2022-01-15 NOTE — Progress Notes (Signed)
? ?Patient Care Team: ?[name], MD as PCP - General (Family Medicine) ?Mauro Kaufmann, RN as Oncology Nurse Navigator ?Rockwell Germany, RN as Oncology Nurse Navigator ? ?DIAGNOSIS:  ?Encounter Diagnosis  ?Name Primary?  ? Malignant neoplasm of upper-inner quadrant of right breast in female, estrogen receptor negative ([place])   ? ? ?SUMMARY OF ONCOLOGIC HISTORY: ?Oncology History  ?History of left breast cancer  ?06/05/2021 Initial Biopsy  ? History of left breast cancer status postlumpectomy 2003 ?Pain in the right breast: Mammogram and ultrasound revealed indeterminate masses in the right breast spanning 2.4 cm biopsy revealed grade 3 IDC ER 5%, PR 0%, Ki67 60%, HER2 negative ?  ?Pain of left breast  ?Malignant neoplasm of upper-inner quadrant of right breast in female, estrogen receptor negative ([place])  ?11/09/2020 -  Radiation Therapy  ? Adjuvant radiation ?  ?06/05/2021 Initial Biopsy  ? History of left breast cancer status postlumpectomy 2003 ?Pain in the right breast: Mammogram and ultrasound revealed indeterminate masses in the right breast spanning 2.4 cm biopsy revealed grade 3 IDC ER 5%, PR 0%, Ki67 60%, HER2 negative ?  ?06/11/2021 Cancer Staging  ? Staging form: Breast, AJCC 8th Edition ?- Clinical stage from 06/11/2021: Stage IIB (cT2, cN0, cM0, G3, ER-, PR-, HER2-) - Signed by Nicholas Lose, MD on 06/11/2021 ?Stage prefix: Initial diagnosis ?Histologic grading system: 3 grade system ? ?  ?06/19/2021 - 08/11/2021 Chemotherapy  ? Neoadjuvant chemotherapy: Taxotere and Cytoxan x4 cycles ?  ?10/05/2021 Surgery  ? Right lumpectomy: 0.4 cm grade 2 IDC with DCIS 0/6 LN Neg, ER 0%, PR 0%, Ki-67 5%, HER2 0 ?  ?11/09/2021 - 12/24/2021 Radiation Therapy  ? Adjuvant radiation ?  ? ? ?CHIEF COMPLIANT: Follow-up of right breast cancer ? ?INTERVAL HISTORY: Sabrina Wright is a 61 y.o. with above-mentioned history of right breast cancer having undergone chemotherapy with Taxotere and Cytoxan and right lumpectomy, and completed  radiation therapy. She presents to the clinic today for follow-up.  She is currently on adjuvant capecitabine. ?She is experiencing nausea bloating of the abdomen along with constipation.  She is taking MiraLAX every other day to help move the bowels.  The nausea feeling is constant.  She does not like taking Zofran because it increases her constipation. ? ? ?ALLERGIES:  is allergic to codeine, eggs or egg-derived products, and gadavist [gadobutrol]. ? ?MEDICATIONS:  ?Current Outpatient Medications  ?Medication Sig Dispense Refill  ? acetaminophen (TYLENOL) 325 MG tablet Take 2 tablets (650 mg total) by mouth every 6 (six) hours as needed for fever, headache or moderate pain.    ? ALPRAZolam (XANAX) 0.5 MG tablet TAKE 1/2 - 1 TABLETS (0.25-0.5 MG TOTAL) BY MOUTH AT BEDTIME. 30 tablet 0  ? Calcium Carb-Cholecalciferol (CALCIUM 600/VITAMIN D3) 600-800 MG-UNIT TABS Take 2 tablets by mouth daily. 60 tablet   ? capecitabine (XELODA) 500 MG tablet Take 2 tablets (1,000 mg total) by mouth 2 (two) times daily after a meal. 14 days on and 7 days off 56 tablet 7  ? Cholecalciferol (VITAMIN D) 50 MCG (2000 UT) CAPS Take 1 capsule (2,000 Units total) by mouth daily. 30 capsule   ? clobetasol cream (TEMOVATE) 9.56 % APPLY 1 APPLICATION TOPICALLY 2 (TWO) TIMES DAILY. APPLY TO AFFECTED AREA NO MORE THAN 2 WEEKS AT A TIME+ 30 g 0  ? denosumab (PROLIA) 60 MG/ML SOSY injection INJECT 60MG SUBCUTANEOUSLY  EVERY 6 MONTHS (GIVEN AT  PRESCRIBERS OFFICE) (Patient taking differently: Inject 60 mg into the skin every 6 (  six) months. INJECT 60MG SUBCUTANEOUSLY  EVERY 6 MONTHS (GIVEN AT  PRESCRIBERS OFFICE)) 1 mL 1  ? DULoxetine (CYMBALTA) 30 MG capsule Take 1 capsule (30 mg total) by mouth daily. Start after you have been on the 17m citalopram for a week 90 capsule 0  ? gabapentin (NEURONTIN) 100 MG capsule Take 100 mg by mouth 3 (three) times daily.    ? loratadine (CLARITIN) 10 MG tablet Take 10 mg by mouth as needed for allergies.    ?  Multiple Vitamin (MULTIVITAMIN WITH MINERALS) TABS tablet Take 1 tablet by mouth at bedtime.    ? ondansetron (ZOFRAN) 8 MG tablet TAKE 1 TABLET BY MOUTH TWICE A DAY AS NEEDED FOR REFRACTORY NAUSEA/VOMITING START ON DAY 3 AFTER CHEMO (Patient not taking: Reported on 01/15/2022) 30 tablet 1  ? prochlorperazine (COMPAZINE) 10 MG tablet Take 1 tablet (10 mg total) by mouth every 6 (six) hours as needed (Nausea or vomiting). 30 tablet 1  ? SUMAtriptan (IMITREX) 100 MG tablet TAKE 1 TABLET BY MOUTH ONCE FOR 1 DOSE. MAY REPEAT IN 2 HOURS IF HEADACHE PERSISTS OR RECURS. (Patient not taking: Reported on 01/15/2022) 9 tablet 3  ? traMADol HCl 100 MG TABS Take 100 mg by mouth 3 (three) times daily. 90 tablet 0  ? vitamin B-12 (V-R VITAMIN B-12) 500 MCG tablet Take 1 tablet (500 mcg total) by mouth daily.    ? ?No current facility-administered medications for this visit.  ? ? ?PHYSICAL EXAMINATION: ?ECOG PERFORMANCE STATUS: 1 - Symptomatic but completely ambulatory ? ?Vitals:  ? 01/17/22 0800  ?BP: (!) 161/97  ?Pulse: 73  ?Resp: 19  ?Temp: 97.8 ?F (36.6 ?C)  ?SpO2: 96%  ? ?Filed Weights  ? 01/17/22 0800  ?Weight: 177 lb 9.6 oz (80.6 kg)  ? ?  ? ?LABORATORY DATA:  ?I have reviewed the data as listed ?CMP Latest Ref Rng & Units 01/15/2022 08/21/2021 07/31/2021  ?Glucose 70 - 99 mg/dL 89 145(H) 81  ?BUN 6 - 23 mg/dL _0 ?Creatinine 0.40 - 1.20 mg/dL 0.81 0.76 0.65  ?Sodium 135 - 145 mEq/L 136 137 138  ?Potassium 3.5 - 5.1 mEq/L 4.1 4.1 3.9  ?Chloride 96 - 112 mEq/L 101 103 105  ?CO2 19 - 32 mEq/L 28 21(L) 24  ?Calcium 8.4 - 10.5 mg/dL 9.6 9.7 9.4  ?Total Protein 6.0 - 8.3 g/dL 6.5 7.2 6.8  ?Total Bilirubin 0.2 - 1.2 mg/dL 0.5 0.3 0.2(L)  ?Alkaline Phos 39 - 117 U/L 103 65 64  ?AST 0 - 37 U/L _1 ?ALT 0 - 35 U/L _2 ? ? ?Lab Results  ?Component Value Date  ? WBC 7.4 01/15/2022  ? HGB 12.8 01/15/2022  ? HCT 38.6 01/15/2022  ? MCV 86.0 01/15/2022  ? PLT 331.0 01/15/2022  ? NEUTROABS 16.7 (H) 08/21/2021  ? ? ?ASSESSMENT  & PLAN:  ?Malignant neoplasm of upper-inner quadrant of right breast in female, estrogen receptor negative ([place] ?History of left breast cancer status postlumpectomy 2003 status post chemo with Adriamycin, radiation ?06/05/2021: Pain in the right breast: Mammogram and ultrasound revealed indeterminate masses in the right breast spanning 2.4 cm biopsy revealed grade 3 IDC ER 5%, PR 0%, Ki67 60%, HER2 negative ?  ?Treatment plan: ?1.  Neoadjuvant chemotherapy with Taxotere and Cytoxan x4 (06/19/21- 08/21/21) ?2. 10/05/2021: Breast conserving surgery with sentinel lymph node biopsy: 0.4 cm grade 2 IDC with DCIS 0/6 LN Neg, ER 0%, PR 0%, Ki-67 5%, HER2 0  ?  3.  Adjuvant radiation therapy 11/09/2021-12/24/2021 ?4.  Based on final pathology being ER negative, I did not recommend antiestrogen therapy.  Capecitabine started 01/02/2022 ?----------------------------------------------------------------------------------------------------------------------- ?Capecitabine toxicities: ?Nausea and bloating sensation: I discussed with her about taking Compazine ahead of the medication so that she can prevent the nausea.  She is willing to try that.  She wants to continue with the full dosage as prescribed. ?Severe constipation: Instead of having diarrhea she is having constipation issues. ? ?Return to clinic in 3 weeks for follow-up ? ? ? ?No orders of the defined types were placed in this encounter. ? ?The patient has a good understanding of the overall plan. she agrees with it. she will call with any problems that may develop before the next visit here. ?Total time spent: 30 mins including face to face time and time spent for planning, charting and co-ordination of care ? ? Harriette Ohara, MD ?01/17/22 ? ? ? I, Gardiner Coins, am acting as a scribe for Dr. Lindi Adie  ?

## 2022-01-16 ENCOUNTER — Encounter: Payer: Self-pay | Admitting: Radiation Oncology

## 2022-01-17 ENCOUNTER — Other Ambulatory Visit: Payer: Self-pay

## 2022-01-17 ENCOUNTER — Other Ambulatory Visit: Payer: Self-pay | Admitting: Nurse Practitioner

## 2022-01-17 ENCOUNTER — Other Ambulatory Visit (HOSPITAL_COMMUNITY): Payer: Self-pay

## 2022-01-17 ENCOUNTER — Inpatient Hospital Stay: Payer: BC Managed Care – PPO | Attending: Hematology and Oncology

## 2022-01-17 ENCOUNTER — Telehealth: Payer: Self-pay | Admitting: *Deleted

## 2022-01-17 ENCOUNTER — Telehealth: Payer: Self-pay | Admitting: Nurse Practitioner

## 2022-01-17 ENCOUNTER — Inpatient Hospital Stay (HOSPITAL_BASED_OUTPATIENT_CLINIC_OR_DEPARTMENT_OTHER): Payer: BC Managed Care – PPO | Admitting: Hematology and Oncology

## 2022-01-17 ENCOUNTER — Encounter: Payer: Self-pay | Admitting: Hematology and Oncology

## 2022-01-17 DIAGNOSIS — Z171 Estrogen receptor negative status [ER-]: Secondary | ICD-10-CM

## 2022-01-17 DIAGNOSIS — Z79899 Other long term (current) drug therapy: Secondary | ICD-10-CM | POA: Insufficient documentation

## 2022-01-17 DIAGNOSIS — K5900 Constipation, unspecified: Secondary | ICD-10-CM | POA: Insufficient documentation

## 2022-01-17 DIAGNOSIS — C50211 Malignant neoplasm of upper-inner quadrant of right female breast: Secondary | ICD-10-CM | POA: Insufficient documentation

## 2022-01-17 DIAGNOSIS — R110 Nausea: Secondary | ICD-10-CM | POA: Insufficient documentation

## 2022-01-17 DIAGNOSIS — Z923 Personal history of irradiation: Secondary | ICD-10-CM | POA: Diagnosis not present

## 2022-01-17 DIAGNOSIS — Z9221 Personal history of antineoplastic chemotherapy: Secondary | ICD-10-CM | POA: Diagnosis not present

## 2022-01-17 DIAGNOSIS — R140 Abdominal distension (gaseous): Secondary | ICD-10-CM | POA: Insufficient documentation

## 2022-01-17 MED ORDER — PREDNISONE 20 MG PO TABS: ORAL_TABLET | ORAL | 0 refills | Status: AC

## 2022-01-17 NOTE — Telephone Encounter (Signed)
Called the patient and spoke with her and informed her that the provider sent her prednisone to the pharmacy and she understood.  Saksham Akkerman,cm a ?

## 2022-01-17 NOTE — Telephone Encounter (Signed)
Received call from pt stating PCP is wanting to place her on a steroid taper dose for left foot pain. RN reviewed with MD and verbal orders received for pt to proceed with tx.  Pt educated and verbalized understanding.  ?

## 2022-01-17 NOTE — Telephone Encounter (Signed)
Medication sent to requested pharmacy.

## 2022-01-17 NOTE — Telephone Encounter (Signed)
-----   Message from Gordy Councilman, [place] sent at 01/17/2022 10:13 AM EDT ----- ?I called the patient and informed her of her xray and lab results and she understood.  Patient stated she would like to try a steroid and you can send to [facility].  Nina,cma  ?

## 2022-01-17 NOTE — Assessment & Plan Note (Signed)
History of left breast cancer status postlumpectomy 2003?status post chemo with Adriamycin, radiation ?06/05/2021:?Pain in the right breast: Mammogram and ultrasound revealed indeterminate masses in the right breast spanning 2.4 cm biopsy revealed grade 3 IDC ER 5%, PR 0%, Ki67 60%, HER2 negative ?? ?Treatment plan: ?1.??Neoadjuvant chemotherapy with Taxotere?and Cytoxan?x4?(06/19/21- 08/21/21) ?2.?10/05/2021: Breast conserving surgery with sentinel lymph node biopsy: 0.4 cm grade 2 IDC with DCIS 0/6 LN Neg, ER 0%, PR 0%, Ki-67 5%, HER2 0  ?3.??Adjuvant radiation therapy?11/09/2021-12/24/2021 ?4.??Based on final pathology being ER negative, I did not recommend antiestrogen therapy.  Capecitabine started 01/02/2022 ?----------------------------------------------------------------------------------------------------------------------- ?Capecitabine toxicities: ? ?Return to clinic in 1 month for survivorship care plan was and for evaluation of capecitabine related toxicities and blood counts. ?

## 2022-01-18 ENCOUNTER — Telehealth: Payer: Self-pay | Admitting: Hematology and Oncology

## 2022-01-18 NOTE — Telephone Encounter (Signed)
Scheduled appointment per 3/16 los. Patient is aware. ?

## 2022-01-20 NOTE — Progress Notes (Signed)
?Radiation Oncology         [phone] ?________________________________ ? ?Name: Sabrina Wright MRN: [id number]  ?Date: 01/21/2022  DOB: 04-Nov-1961 ? ?Follow-Up Visit Note ? ?CC: [name], MD  [name], MD ? ?  ICD-10-CM   ?1. Malignant neoplasm of upper-inner quadrant of right breast in female, estrogen receptor negative ([place])  C50.211   ? Z17.1   ?  ? ? 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 Currently on oral chemotherapy consisting of xeloda  ? ?Interval Since Last Radiation: 1 month  ? ?Intent: Curative ? ?Radiation Treatment Dates: 11/08/2021 through 12/24/2021 ?Site Technique Total Dose (Gy) Dose per Fx (Gy) Completed Fx Beam Energies  ?Breast, Right: Breast_R 3D 50.4/50.4 1.8 28/28 10X  ?Breast, Right: Breast_R_Bst 3D 10/10 2 5/5 6X, 10X  ? ? ?Narrative:  The patient returns today for routine follow-up. The patient tolerated radiation therapy relatively well. On the date of her final treatment, the patient reported pain due to skin irritation (redness and peeling), fatigue, and continued swelling and tenderness to the right breast. Physical exam performed on the date of her final treatment showed erythema and hyperpigmentation changes throughout the right breast, and healing moist desquamation in the inframammary fold.  Mild skin breakdown was also appreciated in the axillary region which appeared noticeably better.  No signs of infection were appreciated.     ? ?In the interval since she was last seen for re-evaluation on 10/31/21, the patient met with Dr. Lindi Adie on 12/10/21. During which time, Dr. Lindi Adie did not recommend antiestrogen therapy given her triple negative disease. Following discussion of the risks and benefits, the patient did agree to begin oral chemotherapy consisting of xeloda on 01/02/22. Per her most  recent follow up with Dr. Lindi Adie on 01/17/22, the patient reported nausea, abdominal bloating, and constipation.  In regards to these symptoms, Dr. Lindi Adie advised the patient to take compazine before her xeloda to prevent these symptoms.  ? ?The patient also recently followed up with Dr. Marlou Starks on 01/11/22. During this visit, the patient reported slow improvement of her ongoing chest wall pain, and denied any other complaints. She will follow-up again with Dr. Marlou Starks in 3 months.                            ? ?Allergies:  is allergic to codeine, eggs or egg-derived products, and gadavist [gadobutrol]. ? ?Meds: ?Current Outpatient Medications  ?Medication Sig Dispense Refill  ? acetaminophen (TYLENOL) 325 MG tablet Take 2 tablets (650 mg total) by mouth every 6 (six) hours as needed for fever, headache or moderate pain.    ? ALPRAZolam (XANAX) 0.5 MG tablet TAKE 1/2 - 1 TABLETS (0.25-0.5 MG TOTAL) BY MOUTH AT BEDTIME. 30 tablet 0  ? Calcium Carb-Cholecalciferol (CALCIUM 600/VITAMIN D3) 600-800 MG-UNIT TABS Take 2 tablets by mouth daily. 60 tablet   ? capecitabine (XELODA) 500 MG tablet Take 2 tablets (1,000 mg total) by mouth 2 (two) times daily after a meal. 14 days on and 7 days off 56 tablet 7  ? Cholecalciferol (VITAMIN D) 50 MCG (2000 UT) CAPS Take 1 capsule (2,000 Units total) by mouth daily. 30 capsule   ? clobetasol cream (TEMOVATE) 6.27 % APPLY 1 APPLICATION TOPICALLY 2 (TWO) TIMES DAILY. APPLY TO AFFECTED AREA NO MORE THAN  2 WEEKS AT A TIME+ 30 g 0  ? denosumab (PROLIA) 60 MG/ML SOSY injection INJECT 60MG SUBCUTANEOUSLY  EVERY 6 MONTHS (GIVEN AT  PRESCRIBERS OFFICE) (Patient taking differently: Inject 60 mg into the skin every 6 (six) months. INJECT 60MG SUBCUTANEOUSLY  EVERY 6 MONTHS (GIVEN AT  PRESCRIBERS OFFICE)) 1 mL 1  ? DULoxetine (CYMBALTA) 30 MG capsule Take 1 capsule (30 mg total) by mouth daily. Start after you have been on the 90m citalopram for a week 90 capsule 0  ? gabapentin (NEURONTIN) 100 MG  capsule Take 100 mg by mouth 3 (three) times daily.    ? loratadine (CLARITIN) 10 MG tablet Take 10 mg by mouth as needed for allergies.    ? Multiple Vitamin (MULTIVITAMIN WITH MINERALS) TABS tablet Take 1 tablet by mouth at bedtime.    ? predniSONE (DELTASONE) 20 MG tablet Take 1 tablet (20 mg total) by mouth 2 (two) times daily with a meal for 3 days, THEN 1 tablet (20 mg total) daily with breakfast for 3 days. Avoid NSAIDs like ibuprofen, aleve, naproxen,, BC/Goody powder. 9 tablet 0  ? prochlorperazine (COMPAZINE) 10 MG tablet Take 1 tablet (10 mg total) by mouth every 6 (six) hours as needed (Nausea or vomiting). 30 tablet 1  ? SUMAtriptan (IMITREX) 100 MG tablet TAKE 1 TABLET BY MOUTH ONCE FOR 1 DOSE. MAY REPEAT IN 2 HOURS IF HEADACHE PERSISTS OR RECURS. (Patient not taking: Reported on 01/15/2022) 9 tablet 3  ? traMADol HCl 100 MG TABS Take 100 mg by mouth 3 (three) times daily. 90 tablet 0  ? vitamin B-12 (V-R VITAMIN B-12) 500 MCG tablet Take 1 tablet (500 mcg total) by mouth daily.    ? ?No current facility-administered medications for this encounter.  ? ? ?Physical Findings: ?The patient is in no acute distress. Patient is alert and oriented. ? height is '5\' 4"'  (1.626 m) and weight is 177 lb 12.8 oz (80.6 kg). Her temperature is 97.9 ?F (36.6 ?C). Her blood pressure is 129/67 and her pulse is 61. Her respiration is 20 and oxygen saturation is 100%. .  No significant changes. Lungs are clear to auscultation bilaterally. Heart has regular rate and rhythm. No palpable cervical, supraclavicular, or axillary adenopathy. Abdomen soft, non-tender, normal bowel sounds. ? ?Right Breast: no palpable mass, nipple discharge or bleeding.  Skin is healed well at this time.  She has some induration superior to her lumpectomy scar which is tender with palpation.  She is diffusely tender throughout both breast on exam as noted on previous exams.  She has some tenderness with palpation inferior to her axillary scar but no  significant findings in this area.  She continues to have some edema on the breast most notably in the nipple areolar complex area.  Examination of the left breast reveals previous surgical scars.  No dominant mass appreciated in the breast nipple discharge or bleeding. ? ? ? ?Lab Findings: ?Lab Results  ?Component Value Date  ? WBC 7.4 01/15/2022  ? HGB 12.8 01/15/2022  ? HCT 38.6 01/15/2022  ? MCV 86.0 01/15/2022  ? PLT 331.0 01/15/2022  ? ? ?Radiographic Findings: ?DG Ankle Complete Left ? ?Result Date: 01/15/2022 ?CLINICAL DATA:  Pain after fall.  Pain at lateral aspect of foot. EXAM: LEFT ANKLE COMPLETE - 3+ VIEW COMPARISON:  Left foot 01/15/2022 FINDINGS: Left ankle is located without a fracture. Normal alignment. No focal soft tissue swelling. IMPRESSION: No acute bone abnormality to left ankle. Electronically Signed   By:  Markus Daft M.D.   On: 01/15/2022 09:10  ? ?DG Foot Complete Left ? ?Result Date: 01/15/2022 ?CLINICAL DATA:  Foot pain after fall. EXAM: LEFT FOOT - COMPLETE 3+ VIEW COMPARISON:  Left ankle 01/15/2022 FINDINGS: Negative for a fracture or dislocation in the left foot. The alignment is within normal limits. Prominent plantar calcaneal spur. IMPRESSION: No acute bone abnormality in the left foot. Calcaneal spur. Electronically Signed   By: Markus Daft M.D.   On: 01/15/2022 09:07   ? 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 Currently on oral chemotherapy consisting of xeloda  ? ?The patient is recovering from the effects of radiation.  She continues to have some edema in the right breast but her skin is healed well at this time.  Her pain is improved but still requiring medications on a regular basis.Marland Kitchen ? ?Plan: She will continue on Xeloda chemotherapy since her tumor is triple negative.  Routine follow-up in radiation oncology  in 3 months. ? ? ?____________________________________ ? ?Blair Promise, PhD, MD ? ? ?This document serves as a record of services personally performed by Gery Pray, MD. It was created on his behalf

## 2022-01-21 ENCOUNTER — Encounter: Payer: Self-pay | Admitting: Radiation Oncology

## 2022-01-21 ENCOUNTER — Other Ambulatory Visit: Payer: Self-pay

## 2022-01-21 ENCOUNTER — Ambulatory Visit
Admission: RE | Admit: 2022-01-21 | Discharge: 2022-01-21 | Disposition: A | Discharge status: Home / Self Care | Payer: BC Managed Care – PPO | Source: Ambulatory Visit | Attending: Radiation Oncology | Admitting: Radiation Oncology

## 2022-01-21 DIAGNOSIS — Z923 Personal history of irradiation: Secondary | ICD-10-CM | POA: Diagnosis not present

## 2022-01-21 DIAGNOSIS — N644 Mastodynia: Secondary | ICD-10-CM | POA: Insufficient documentation

## 2022-01-21 DIAGNOSIS — Z9221 Personal history of antineoplastic chemotherapy: Secondary | ICD-10-CM | POA: Diagnosis not present

## 2022-01-21 DIAGNOSIS — Z79899 Other long term (current) drug therapy: Secondary | ICD-10-CM | POA: Diagnosis not present

## 2022-01-21 DIAGNOSIS — Y842 Radiological procedure and radiotherapy as the cause of abnormal reaction of the patient, or of later complication, without mention of misadventure at the time of the procedure: Secondary | ICD-10-CM | POA: Insufficient documentation

## 2022-01-21 DIAGNOSIS — C50211 Malignant neoplasm of upper-inner quadrant of right female breast: Secondary | ICD-10-CM | POA: Diagnosis not present

## 2022-01-21 DIAGNOSIS — Z171 Estrogen receptor negative status [ER-]: Secondary | ICD-10-CM | POA: Insufficient documentation

## 2022-01-21 HISTORY — DX: Personal history of irradiation: Z92.3

## 2022-01-21 NOTE — Progress Notes (Signed)
Sabrina Wright is here today for follow up post radiation to the breast. ? ? Breast Side:Right ? ? ?They completed their radiation on: 12/24/21  ? ?Does the patient complain of any of the following: ?Post radiation skin issues: Patient  reports major improvements to skin to right breast.  ?Breast Tenderness: Patient reports having a hardened area above surgical incision. Reports tenderness to right axilla.  ?Breast Swelling: No ?Lymphadema: no ?Range of Motion limitations: Patient reports pain and tightness when lifting right arm.  ?Fatigue post radiation: Continues to have a low energy level.  ?Appetite good/fair/poor: Good ? ?Additional comments if applicable:  ?Vitals:  ? 01/21/22 1544  ?BP: 129/67  ?Pulse: 61  ?Resp: 20  ?Temp: 97.9 ?F (36.6 ?C)  ?SpO2: 100%  ?Weight: 177 lb 12.8 oz (80.6 kg)  ?Height: '5\' 4"'$  (1.626 m)  ?  ?  ?

## 2022-01-22 ENCOUNTER — Telehealth: Payer: Self-pay | Admitting: *Deleted

## 2022-01-22 NOTE — Telephone Encounter (Signed)
CALLED PATIENT TO INFORM OF FU APPT. WITH DR. KINARD ON 04-25-22 @ 11 AM, SPOKE WITH PATIENT'S HUSBAND AND HE IS AWARE OF THIS APPT. ?

## 2022-01-24 ENCOUNTER — Ambulatory Visit: Payer: BC Managed Care – PPO | Admitting: Pharmacist

## 2022-01-30 ENCOUNTER — Telehealth: Payer: Self-pay | Admitting: Adult Health

## 2022-01-30 NOTE — Telephone Encounter (Signed)
.  Called patient to schedule appointment per 3/29 inbakset, patient is aware of date and time.   ?

## 2022-02-05 ENCOUNTER — Telehealth: Payer: Self-pay | Admitting: *Deleted

## 2022-02-08 ENCOUNTER — Other Ambulatory Visit: Payer: Self-pay

## 2022-02-08 ENCOUNTER — Inpatient Hospital Stay: Payer: BC Managed Care – PPO

## 2022-02-08 ENCOUNTER — Inpatient Hospital Stay: Payer: BC Managed Care – PPO | Attending: Hematology and Oncology

## 2022-02-08 ENCOUNTER — Inpatient Hospital Stay: Payer: BC Managed Care – PPO | Admitting: Adult Health

## 2022-02-08 ENCOUNTER — Other Ambulatory Visit (HOSPITAL_COMMUNITY): Payer: Self-pay

## 2022-02-08 ENCOUNTER — Inpatient Hospital Stay (HOSPITAL_BASED_OUTPATIENT_CLINIC_OR_DEPARTMENT_OTHER): Payer: BC Managed Care – PPO | Admitting: Adult Health

## 2022-02-08 ENCOUNTER — Encounter: Payer: Self-pay | Admitting: Adult Health

## 2022-02-08 VITALS — BP 107/93 | HR 73 | Temp 97.7°F | Resp 16 | Ht 64.0 in | Wt 176.3 lb

## 2022-02-08 DIAGNOSIS — Z853 Personal history of malignant neoplasm of breast: Secondary | ICD-10-CM | POA: Insufficient documentation

## 2022-02-08 DIAGNOSIS — Z923 Personal history of irradiation: Secondary | ICD-10-CM | POA: Diagnosis not present

## 2022-02-08 DIAGNOSIS — Z1211 Encounter for screening for malignant neoplasm of colon: Secondary | ICD-10-CM | POA: Diagnosis not present

## 2022-02-08 DIAGNOSIS — Z9221 Personal history of antineoplastic chemotherapy: Secondary | ICD-10-CM | POA: Insufficient documentation

## 2022-02-08 DIAGNOSIS — Z87891 Personal history of nicotine dependence: Secondary | ICD-10-CM | POA: Diagnosis not present

## 2022-02-08 DIAGNOSIS — Z23 Encounter for immunization: Secondary | ICD-10-CM

## 2022-02-08 DIAGNOSIS — M810 Age-related osteoporosis without current pathological fracture: Secondary | ICD-10-CM | POA: Insufficient documentation

## 2022-02-08 DIAGNOSIS — C50211 Malignant neoplasm of upper-inner quadrant of right female breast: Secondary | ICD-10-CM | POA: Diagnosis not present

## 2022-02-08 DIAGNOSIS — Z171 Estrogen receptor negative status [ER-]: Secondary | ICD-10-CM

## 2022-02-08 DIAGNOSIS — Z803 Family history of malignant neoplasm of breast: Secondary | ICD-10-CM | POA: Diagnosis not present

## 2022-02-08 DIAGNOSIS — F32A Depression, unspecified: Secondary | ICD-10-CM | POA: Diagnosis not present

## 2022-02-08 DIAGNOSIS — Z79899 Other long term (current) drug therapy: Secondary | ICD-10-CM | POA: Diagnosis not present

## 2022-02-08 LAB — COMPREHENSIVE METABOLIC PANEL
ALT: 14 U/L (ref 0–44)
AST: 18 U/L (ref 15–41)
Albumin: 4 g/dL (ref 3.5–5.0)
Alkaline Phosphatase: 101 U/L (ref 38–126)
Anion gap: 8 (ref 5–15)
BUN: 12 mg/dL (ref 8–23)
CO2: 26 mmol/L (ref 22–32)
Calcium: 9.5 mg/dL (ref 8.9–10.3)
Chloride: 104 mmol/L (ref 98–111)
Creatinine, Ser: 0.79 mg/dL (ref 0.44–1.00)
GFR, Estimated: >60 mL/min (ref >60)
Glucose, Bld: 101 mg/dL — ABNORMAL HIGH (ref 70–99)
Potassium: 4.1 mmol/L (ref 3.5–5.1)
Sodium: 138 mmol/L (ref 135–145)
Total Bilirubin: 0.5 mg/dL (ref 0.3–1.2)
Total Protein: 7.1 g/dL (ref 6.5–8.1)

## 2022-02-08 LAB — CBC WITH DIFFERENTIAL/PLATELET
Abs Immature Granulocytes: 0.02 10*3/uL (ref 0.00–0.07)
Basophils Absolute: 0.1 10*3/uL (ref 0.0–0.1)
Basophils Relative: 1 %
Eosinophils Absolute: 0.3 10*3/uL (ref 0.0–0.5)
Eosinophils Relative: 4 %
HCT: 39.4 % (ref 36.0–46.0)
Hemoglobin: 13 g/dL (ref 12.0–15.0)
Immature Granulocytes: 0 %
Lymphocytes Relative: 10 %
Lymphs Abs: 0.8 10*3/uL (ref 0.7–4.0)
MCH: 29.3 pg (ref 26.0–34.0)
MCHC: 33 g/dL (ref 30.0–36.0)
MCV: 88.7 fL (ref 80.0–100.0)
Monocytes Absolute: 0.7 10*3/uL (ref 0.1–1.0)
Monocytes Relative: 10 %
Neutro Abs: 5.5 10*3/uL (ref 1.7–7.7)
Neutrophils Relative %: 75 %
Platelets: 355 10*3/uL (ref 150–400)
RBC: 4.44 MIL/uL (ref 3.87–5.11)
RDW: 17.2 % — ABNORMAL HIGH (ref 11.5–15.5)
WBC: 7.5 10*3/uL (ref 4.0–10.5)
nRBC: 0 % (ref 0.0–0.2)

## 2022-02-08 MED ORDER — CAPECITABINE 500 MG PO TABS
1000.0000 mg | ORAL_TABLET | Freq: Two times a day (BID) | ORAL | 7 refills | Status: DC
  Filled 2022-02-08: qty 56, 14d supply, fill #0
  Filled 2022-02-11: qty 56, 21d supply, fill #0

## 2022-02-08 MED ORDER — PNEUMOCOCCAL 20-VAL CONJ VACC 0.5 ML IM SUSY
0.5000 mL | PREFILLED_SYRINGE | Freq: Once | INTRAMUSCULAR | Status: AC
  Administered 2022-02-08: 0.5 mL via INTRAMUSCULAR
  Filled 2022-02-08: qty 0.5

## 2022-02-08 MED ORDER — PNEUMOCOCCAL 20-VAL CONJ VACC 0.5 ML IM SUSY
0.5000 mL | PREFILLED_SYRINGE | Freq: Once | INTRAMUSCULAR | Status: DC
  Filled 2022-02-08: qty 0.5
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
## 2022-02-09 ENCOUNTER — Other Ambulatory Visit: Payer: Self-pay | Admitting: Family Medicine

## 2022-02-11 ENCOUNTER — Other Ambulatory Visit (HOSPITAL_COMMUNITY): Payer: Self-pay

## 2022-02-11 ENCOUNTER — Encounter: Payer: Self-pay | Admitting: Hematology and Oncology

## 2022-02-13 ENCOUNTER — Telehealth: Payer: Self-pay

## 2022-02-13 NOTE — Telephone Encounter (Signed)
Dr Darnell Level, per patient's chart and recent Oncology visit she was cleared to proceed with Prolia again. Labs were done on 02/08/22 Calcium was normal 9.5. CrCl is 94.44 mL/min. ?Last Prolia injection was done April 2022. Is it ok to just start over now or need to get something else done prior? ?

## 2022-02-18 ENCOUNTER — Other Ambulatory Visit (HOSPITAL_COMMUNITY): Payer: Self-pay

## 2022-02-18 NOTE — Telephone Encounter (Signed)
Ok to restart asap.  ?Can we also go ahead and schedule CPE as she's due? If will take too long to get in, rec schedule OV for f/u mood.  ?Thanks!  ?

## 2022-02-18 NOTE — Telephone Encounter (Signed)
Will work on this

## 2022-02-19 MED ORDER — PROLIA 60 MG/ML ~~LOC~~ SOSY: PREFILLED_SYRINGE | SUBCUTANEOUS | 0 refills | Status: DC

## 2022-02-19 NOTE — Telephone Encounter (Signed)
Patient is scheduled for follow up on 02/20/22. RX for Prolia sent in to [place], patient will work on getting co pay card assistance set up. See other prolia phone note about this ?

## 2022-02-19 NOTE — Telephone Encounter (Addendum)
See other phone note. Ok to proceed with Prolia injection. Patient gets this from [facility] [facility] order. Patient is aware to work on getting a co pay card for this year and that [employer] will call patient to confirm delivery to our office. RX sent in. ? ?Labs were done on 02/08/22 Calcium was normal 9.5. CrCl is 94.44 mL/min. ?

## 2022-02-19 NOTE — Addendum Note (Signed)
Addended by: Kris Mouton on: 02/19/2022 09:20 AM ? ? Modules accepted: Orders ? ?

## 2022-02-20 ENCOUNTER — Encounter: Payer: Self-pay | Admitting: Family Medicine

## 2022-02-20 ENCOUNTER — Ambulatory Visit (INDEPENDENT_AMBULATORY_CARE_PROVIDER_SITE_OTHER): Payer: BC Managed Care – PPO | Admitting: Family Medicine

## 2022-02-20 VITALS — BP 116/70 | HR 90 | Temp 97.7°F | Ht 64.0 in | Wt 177.1 lb

## 2022-02-20 DIAGNOSIS — Z171 Estrogen receptor negative status [ER-]: Secondary | ICD-10-CM

## 2022-02-20 DIAGNOSIS — M792 Neuralgia and neuritis, unspecified: Secondary | ICD-10-CM | POA: Diagnosis not present

## 2022-02-20 DIAGNOSIS — F331 Major depressive disorder, recurrent, moderate: Secondary | ICD-10-CM | POA: Diagnosis not present

## 2022-02-20 DIAGNOSIS — C50211 Malignant neoplasm of upper-inner quadrant of right female breast: Secondary | ICD-10-CM | POA: Diagnosis not present

## 2022-02-20 DIAGNOSIS — M810 Age-related osteoporosis without current pathological fracture: Secondary | ICD-10-CM

## 2022-02-20 MED ORDER — DULOXETINE HCL 30 MG PO CPEP: 30.0000 mg | ORAL_CAPSULE | Freq: Two times a day (BID) | ORAL | 1 refills | Status: DC

## 2022-02-20 NOTE — Assessment & Plan Note (Signed)
We are in process of getting Prolia for next injection as overdue.  ?

## 2022-02-20 NOTE — Patient Instructions (Addendum)
Good to see you today!  ?Increase cymbalta to '30mg'$  twice daily.  ?Limit tramadol use as it interacts with Cymbalta ?Schedule physical in 1-2 months and we will follow up on mood at that time.  ?

## 2022-02-20 NOTE — Progress Notes (Signed)
? ? Patient ID: Sabrina Wright, female    DOB: 11-25-1960, 61 y.o.   MRN: [id number] ? ?This visit was conducted in person. ? ?BP 116/70   Pulse 90   Temp 97.7 ?F (36.5 ?C) (Temporal)   Ht '5\' 4"'$  (1.626 m)   Wt 177 lb 2 oz (80.3 kg)   LMP 02/14/2005 (Approximate)   SpO2 95%   BMI 30.40 kg/m?   ?Orthostatic VS for the past 24 hrs (Last 3 readings): ? BP- Lying BP- Standing at 3 minutes  ?02/20/22 1434 -- 110/70  ?02/20/22 1431 108/70 --  ? ?CC: mood f/u ?Subjective:  ? ?HPI: ?[name] is a 61 y.o. female presenting on 02/20/2022 for Mood (Here for f/u.) ? ? ?Last seen 05/2021 - around that time diagnosed with R breast cancer s/p chemotherapy, R breast lumpectomy, adjuvant radiation therapy and capecitabine, regularly followed by oncologist. Pamelia Hoit chemo pill makes her nauseated. Avoiding nausea medicines as they are too constipating. Also notes worsened unsteadiness, attributed to BP drops. No neuropathy symptoms noted. Underwent reassuring cardiac evaluation 02/2021. ? ?Pt is in in process of getting Prolia injection - her med needs to go through mail order ([facility]).  ? ?Last CPE 06/2020.  ? ?Saw Matt NP 01/2022 - with worsening depression despite celexa '20mg'$  daily - this was changed to cymbalta '30mg'$  daily. She feels overall better on Cymbalta but notes ongoing fatigue and sleepiness.  ?   ? ?Relevant past medical, surgical, family and social history reviewed and updated as indicated. Interim medical history since our last visit reviewed. ?Allergies and medications reviewed and updated. ?Outpatient Medications Prior to Visit  ?Medication Sig Dispense Refill  ? acetaminophen (TYLENOL) 325 MG tablet Take 2 tablets (650 mg total) by mouth every 6 (six) hours as needed for fever, headache or moderate pain.    ? ALPRAZolam (XANAX) 0.5 MG tablet TAKE 1/2 - 1 TABLETS (0.25-0.5 MG TOTAL) BY MOUTH AT BEDTIME. 30 tablet 0  ? Calcium Carb-Cholecalciferol (CALCIUM 600/VITAMIN D3) 600-800 MG-UNIT TABS Take 2 tablets by  mouth daily. 60 tablet   ? capecitabine (XELODA) 500 MG tablet Take 2 tablets (1,000 mg total) by mouth 2 (two) times daily after a meal. 14 days on and 7 days off 56 tablet 7  ? Cholecalciferol (VITAMIN D) 50 MCG (2000 UT) CAPS Take 1 capsule (2,000 Units total) by mouth daily. 30 capsule   ? denosumab (PROLIA) 60 MG/ML SOSY injection INJECT '60MG'$  SUBCUTANEOUSLY  EVERY 6 MONTHS 1 mL 0  ? gabapentin (NEURONTIN) 100 MG capsule Take 100 mg by mouth 3 (three) times daily.    ? loratadine (CLARITIN) 10 MG tablet Take 10 mg by mouth as needed for allergies.    ? Multiple Vitamin (MULTIVITAMIN WITH MINERALS) TABS tablet Take 1 tablet by mouth at bedtime.    ? SUMAtriptan (IMITREX) 100 MG tablet TAKE 1 TABLET BY MOUTH ONCE FOR 1 DOSE. MAY REPEAT IN 2 HOURS IF HEADACHE PERSISTS OR RECURS. 9 tablet 3  ? traMADol HCl 100 MG TABS Take 100 mg by mouth 3 (three) times daily. 90 tablet 0  ? vitamin B-12 (V-R VITAMIN B-12) 500 MCG tablet Take 1 tablet (500 mcg total) by mouth daily.    ? DULoxetine (CYMBALTA) 30 MG capsule Take 1 capsule (30 mg total) by mouth daily. Start after you have been on the '10mg'$  citalopram for a week 90 capsule 0  ? ?No facility-administered medications prior to visit.  ?  ? ?Per HPI unless specifically indicated  in ROS section below ?Review of Systems ? ?Objective:  ?BP 116/70   Pulse 90   Temp 97.7 ?F (36.5 ?C) (Temporal)   Ht '5\' 4"'$  (1.626 m)   Wt 177 lb 2 oz (80.3 kg)   LMP 02/14/2005 (Approximate)   SpO2 95%   BMI 30.40 kg/m?   ?Wt Readings from Last 3 Encounters:  ?02/20/22 177 lb 2 oz (80.3 kg)  ?02/08/22 176 lb 4.8 oz (80 kg)  ?01/21/22 177 lb 12.8 oz (80.6 kg)  ?  ?  ?Physical Exam ?Vitals and nursing note reviewed.  ?Constitutional:   ?   Appearance: Normal appearance. She is not ill-appearing.  ?Neck:  ?   Thyroid: No thyroid mass or thyromegaly.  ?Cardiovascular:  ?   Rate and Rhythm: Normal rate and regular rhythm.  ?   Pulses: Normal pulses.  ?   Heart sounds: Normal heart sounds. No  murmur heard. ?Pulmonary:  ?   Effort: Pulmonary effort is normal. No respiratory distress.  ?   Breath sounds: Normal breath sounds. No wheezing, rhonchi or rales.  ?Neurological:  ?   Mental Status: She is alert.  ?Psychiatric:     ?   Mood and Affect: Mood normal.     ?   Behavior: Behavior normal.  ? ?   ? ?  02/20/2022  ?  2:35 PM 01/15/2022  ?  9:08 AM 07/03/2020  ? 11:46 AM 09/16/2019  ? 10:22 AM 06/22/2019  ? 10:33 AM  ?Depression screen PHQ 2/9  ?Decreased Interest '3 3 2 2 '$ 0  ?Down, Depressed, Hopeless '2 3 2 1 '$ 0  ?PHQ - 2 Score '5 6 4 3 '$ 0  ?Altered sleeping '2 3 2 3 2  '$ ?Tired, decreased energy '3 3 2 3 3  '$ ?Change in appetite 1 2 0 2 1  ?Feeling bad or failure about yourself  2 3 0 1 1  ?Trouble concentrating 2 2 0 0 1  ?Moving slowly or fidgety/restless 0 1 0 0 0  ?Suicidal thoughts 0 1 0 0 0  ?PHQ-9 Score '15 21 8 12 8  '$ ?  ? ?  02/20/2022  ?  2:36 PM 01/15/2022  ?  9:08 AM 07/03/2020  ? 11:46 AM 09/16/2019  ? 10:22 AM  ?GAD 7 : Generalized Anxiety Score  ?Nervous, Anxious, on Edge '3 3 2 1  '$ ?Control/stop worrying '3 3 2 1  '$ ?Worry too much - different things '3 2 2 1  '$ ?Trouble relaxing '2 2 1 1  '$ ?Restless 0 0 1 0  ?Easily annoyed or irritable '2 1 2 3  '$ ?Afraid - awful might happen '3 3 2 '$ 0  ?Total GAD 7 Score '16 14 12 7  '$ ?Anxiety Difficulty Not difficult at all Extremely difficult    ? ?Assessment & Plan:  ? ?Problem List Items Addressed This Visit   ? ? Neuropathic pain, arm  ?  Managed with tramadol - see above.  ? ?  ?  ? Osteoporosis  ?  We are in process of getting Prolia for next injection as overdue.  ? ?  ?  ? MDD (major depressive disorder), recurrent episode, moderate ([place]) - Primary  ?  Overall improved with transition from celexa to cymbalta however notes ongoing depressed mood manifesting as fatigue, somnolence. Will further titrate cymbalta to '30mg'$  BID, reviewing possible side effect of GI upset. If tolerating well, may try once daily '60mg'$  dose.  ?Reassess at CPE in next 1-2 months ? ?  ?  ?  Relevant  Medications  ? DULoxetine (CYMBALTA) 30 MG capsule  ? Malignant neoplasm of upper-inner quadrant of right breast in female, estrogen receptor negative ([place])  ?  She is regularly taking tramadol '100mg'$  TID for ongoing breast pain after cancer treatment. Discussed in detail increased risk of serotonin syndrome as we increase cymbalta dose + tramadol - reviewed side effects to watch for. To let us know if any develop, she will discuss with onc whether she should switch tramadol Rx to different opiate.  ? ?  ?  ?  ? ?Meds ordered this encounter  ?Medications  ? DULoxetine (CYMBALTA) 30 MG capsule  ?  Sig: Take 1 capsule (30 mg total) by mouth 2 (two) times daily.  ?  Dispense:  180 capsule  ?  Refill:  1  ? ?No orders of the defined types were placed in this encounter. ? ? ?Patient Instructions  ?Good to see you today!  ?Increase cymbalta to '30mg'$  twice daily.  ?Limit tramadol use as it interacts with Cymbalta ?Schedule physical in 1-2 months and we will follow up on mood at that time.  ? ?Follow up plan: ?Return in about 6 weeks (around 04/03/2022) for annual exam, prior fasting for blood work, medicare wellness visit. ? ?[name], MD   ?

## 2022-02-20 NOTE — Assessment & Plan Note (Signed)
Managed with tramadol - see above.  ?

## 2022-02-20 NOTE — Assessment & Plan Note (Addendum)
Overall improved with transition from celexa to cymbalta however notes ongoing depressed mood manifesting as fatigue, somnolence. Will further titrate cymbalta to '30mg'$  BID, reviewing possible side effect of GI upset. If tolerating well, may try once daily '60mg'$  dose.  ?Reassess at CPE in next 1-2 months ?

## 2022-02-20 NOTE — Assessment & Plan Note (Signed)
She is regularly taking tramadol '100mg'$  TID for ongoing breast pain after cancer treatment. Discussed in detail increased risk of serotonin syndrome as we increase cymbalta dose + tramadol - reviewed side effects to watch for. To let us know if any develop, she will discuss with onc whether she should switch tramadol Rx to different opiate.  ?

## 2022-02-21 ENCOUNTER — Other Ambulatory Visit (HOSPITAL_COMMUNITY): Payer: Self-pay

## 2022-02-22 ENCOUNTER — Other Ambulatory Visit: Payer: Self-pay | Admitting: *Deleted

## 2022-02-22 MED ORDER — DICYCLOMINE HCL 10 MG PO CAPS: 10.0000 mg | ORAL_CAPSULE | Freq: Three times a day (TID) | ORAL | 0 refills | Status: DC

## 2022-02-22 NOTE — Progress Notes (Signed)
Received call from pt with complaint of severe abdominal cramping and chills x several days.  Pt requesting to stop Xeloda at this time and states "I can no longer tolerate this medication".  Pt states she is not able to come in today for [facility]. Per MD pt to stop Xeloda x 2 weeks.  Verbal orders received for pt to take Bentyl 10 mg QID to alleviate abdominal cramping.  F/u appt scheduled.  Pt educated to contact our office if symptoms become worse, we can see her sooner.  Pt verbalized understanding.  ?

## 2022-02-25 ENCOUNTER — Ambulatory Visit
Admission: RE | Admit: 2022-02-25 | Discharge: 2022-02-25 | Disposition: A | Discharge status: Home / Self Care | Payer: BC Managed Care – PPO | Source: Ambulatory Visit | Attending: Adult Health | Admitting: Adult Health

## 2022-02-25 ENCOUNTER — Encounter: Payer: Self-pay | Admitting: Hematology and Oncology

## 2022-02-25 ENCOUNTER — Other Ambulatory Visit (HOSPITAL_COMMUNITY): Payer: Self-pay

## 2022-02-25 DIAGNOSIS — R922 Inconclusive mammogram: Secondary | ICD-10-CM | POA: Diagnosis not present

## 2022-02-25 DIAGNOSIS — C50211 Malignant neoplasm of upper-inner quadrant of right female breast: Secondary | ICD-10-CM

## 2022-02-25 DIAGNOSIS — Z853 Personal history of malignant neoplasm of breast: Secondary | ICD-10-CM | POA: Diagnosis not present

## 2022-02-25 DIAGNOSIS — N6489 Other specified disorders of breast: Secondary | ICD-10-CM | POA: Diagnosis not present

## 2022-02-26 NOTE — Telephone Encounter (Signed)
I spoke with [facility] to verify that Prolia will be mailed to [place] by 02/28/22. I called patient and scheduled NV for 03/07/22 to get this done. ?

## 2022-03-07 ENCOUNTER — Inpatient Hospital Stay: Payer: BC Managed Care – PPO | Attending: Hematology and Oncology

## 2022-03-07 ENCOUNTER — Other Ambulatory Visit: Payer: Self-pay

## 2022-03-07 ENCOUNTER — Ambulatory Visit: Payer: BC Managed Care – PPO

## 2022-03-07 ENCOUNTER — Ambulatory Visit (INDEPENDENT_AMBULATORY_CARE_PROVIDER_SITE_OTHER): Payer: BC Managed Care – PPO

## 2022-03-07 ENCOUNTER — Inpatient Hospital Stay (HOSPITAL_BASED_OUTPATIENT_CLINIC_OR_DEPARTMENT_OTHER): Payer: BC Managed Care – PPO | Admitting: Hematology and Oncology

## 2022-03-07 DIAGNOSIS — C50211 Malignant neoplasm of upper-inner quadrant of right female breast: Secondary | ICD-10-CM | POA: Insufficient documentation

## 2022-03-07 DIAGNOSIS — K5900 Constipation, unspecified: Secondary | ICD-10-CM | POA: Diagnosis not present

## 2022-03-07 DIAGNOSIS — R140 Abdominal distension (gaseous): Secondary | ICD-10-CM | POA: Insufficient documentation

## 2022-03-07 DIAGNOSIS — M810 Age-related osteoporosis without current pathological fracture: Secondary | ICD-10-CM | POA: Diagnosis not present

## 2022-03-07 DIAGNOSIS — R112 Nausea with vomiting, unspecified: Secondary | ICD-10-CM | POA: Diagnosis not present

## 2022-03-07 DIAGNOSIS — Z9221 Personal history of antineoplastic chemotherapy: Secondary | ICD-10-CM | POA: Diagnosis not present

## 2022-03-07 DIAGNOSIS — Z79899 Other long term (current) drug therapy: Secondary | ICD-10-CM | POA: Diagnosis not present

## 2022-03-07 DIAGNOSIS — Z923 Personal history of irradiation: Secondary | ICD-10-CM | POA: Insufficient documentation

## 2022-03-07 DIAGNOSIS — Z171 Estrogen receptor negative status [ER-]: Secondary | ICD-10-CM

## 2022-03-07 LAB — CMP (CANCER CENTER ONLY)
ALT: 11 U/L (ref 0–44)
AST: 16 U/L (ref 15–41)
Albumin: 4 g/dL (ref 3.5–5.0)
Alkaline Phosphatase: 109 U/L (ref 38–126)
Anion gap: 7 (ref 5–15)
BUN: 12 mg/dL (ref 8–23)
CO2: 26 mmol/L (ref 22–32)
Calcium: 9.3 mg/dL (ref 8.9–10.3)
Chloride: 104 mmol/L (ref 98–111)
Creatinine: 0.79 mg/dL (ref 0.44–1.00)
GFR, Estimated: >60 mL/min (ref >60)
Glucose, Bld: 125 mg/dL — ABNORMAL HIGH (ref 70–99)
Potassium: 4.1 mmol/L (ref 3.5–5.1)
Sodium: 137 mmol/L (ref 135–145)
Total Bilirubin: 0.3 mg/dL (ref 0.3–1.2)
Total Protein: 7.1 g/dL (ref 6.5–8.1)

## 2022-03-07 LAB — CBC WITH DIFFERENTIAL (CANCER CENTER ONLY)
Abs Immature Granulocytes: 0.02 10*3/uL (ref 0.00–0.07)
Basophils Absolute: 0.1 10*3/uL (ref 0.0–0.1)
Basophils Relative: 1 %
Eosinophils Absolute: 0.3 10*3/uL (ref 0.0–0.5)
Eosinophils Relative: 4 %
HCT: 39.6 % (ref 36.0–46.0)
Hemoglobin: 13 g/dL (ref 12.0–15.0)
Immature Granulocytes: 0 %
Lymphocytes Relative: 13 %
Lymphs Abs: 1 10*3/uL (ref 0.7–4.0)
MCH: 30.2 pg (ref 26.0–34.0)
MCHC: 32.8 g/dL (ref 30.0–36.0)
MCV: 92.1 fL (ref 80.0–100.0)
Monocytes Absolute: 0.7 10*3/uL (ref 0.1–1.0)
Monocytes Relative: 9 %
Neutro Abs: 5.8 10*3/uL (ref 1.7–7.7)
Neutrophils Relative %: 73 %
Platelet Count: 341 10*3/uL (ref 150–400)
RBC: 4.3 MIL/uL (ref 3.87–5.11)
RDW: 17.3 % — ABNORMAL HIGH (ref 11.5–15.5)
WBC Count: 7.9 10*3/uL (ref 4.0–10.5)
nRBC: 0 % (ref 0.0–0.2)

## 2022-03-07 MED ORDER — TRAMADOL HCL 50 MG PO TABS: 50.0000 mg | ORAL_TABLET | Freq: Four times a day (QID) | ORAL | 1 refills | Status: DC | PRN

## 2022-03-07 MED ORDER — ALPRAZOLAM 0.5 MG PO TABS: ORAL_TABLET | ORAL | 3 refills | Status: DC

## 2022-03-07 MED ORDER — DENOSUMAB 60 MG/ML ~~LOC~~ SOSY
60.0000 mg | PREFILLED_SYRINGE | Freq: Once | SUBCUTANEOUS | Status: AC
  Administered 2022-03-07: 60 mg via SUBCUTANEOUS

## 2022-03-07 NOTE — Progress Notes (Signed)
? ?Patient Care Team: ?[name], MD as PCP - General (Family Medicine) ?Nicholas Lose, MD as Consulting Physician (Hematology and Oncology) ?Gery Pray, MD as Consulting Physician (Radiation Oncology) ?Jovita Kussmaul, MD as Consulting Physician (General Surgery) ?Gardenia Phlegm, NP as Nurse Practitioner (Hematology and Oncology) ? ?DIAGNOSIS:  ?Encounter Diagnosis  ?Name Primary?  ? Malignant neoplasm of upper-inner quadrant of right breast in female, estrogen receptor negative ([place])   ? ? ?SUMMARY OF ONCOLOGIC HISTORY: ?Oncology History  ?History of left breast cancer  ?06/05/2021 Initial Biopsy  ? History of left breast cancer status postlumpectomy 2003 ?Pain in the right breast: Mammogram and ultrasound revealed indeterminate masses in the right breast spanning 2.4 cm biopsy revealed grade 3 IDC ER 5%, PR 0%, Ki67 60%, HER2 negative ?  ?Pain of left breast  ?Malignant neoplasm of upper-inner quadrant of right breast in female, estrogen receptor negative ([place])  ?06/05/2021 Initial Biopsy  ? History of left breast cancer status postlumpectomy 2003 ?Pain in the right breast: Mammogram and ultrasound revealed indeterminate masses in the right breast spanning 2.4 cm biopsy revealed grade 3 IDC ER 5%, PR 0%, Ki67 60%, HER2 negative ?  ?06/11/2021 Cancer Staging  ? Staging form: Breast, AJCC 8th Edition ?- Clinical stage from 06/11/2021: Stage IIB (cT2, cN0, cM0, G3, ER-, PR-, HER2-) - Signed by Nicholas Lose, MD on 06/11/2021 ?Stage prefix: Initial diagnosis ?Histologic grading system: 3 grade system ? ?  ?06/19/2021 - 08/11/2021 Chemotherapy  ? Neoadjuvant chemotherapy: Taxotere and Cytoxan x4 cycles ?  ?10/05/2021 Surgery  ? Right lumpectomy: 0.4 cm grade 2 IDC with DCIS 0/6 LN Neg, ER 0%, PR 0%, Ki-67 5%, HER2 0 ?  ?11/09/2021 - 12/24/2021 Radiation Therapy  ? Site Technique Total Dose (Gy) Dose per Fx (Gy) Completed Fx Beam Energies  ?Breast, Right: Breast_R 3D 50.4/50.4 1.8 28/28 10X  ?Breast, Right:  Breast_R_Bst 3D 10/10 2 5/5 6X, 10X  ?  ? ?  ?01/02/2022 -  Chemotherapy  ?  Adjuvant capecitabine ?  ? ? ?CHIEF COMPLIANT: Follow-up on capecitabine ? ?INTERVAL HISTORY: Sabrina Wright is a 78-year with above-mentioned history of triple negative breast cancer was given capecitabine.  Unfortunately she could not tolerate it but she had profound nausea and vomiting and abdominal bloating and constipation.  She finally discontinued it 2 weeks ago and she feels remarkably better. ? ? ?ALLERGIES:  is allergic to codeine, eggs or egg-derived products, and gadavist [gadobutrol]. ? ?MEDICATIONS:  ?Current Outpatient Medications  ?Medication Sig Dispense Refill  ? acetaminophen (TYLENOL) 325 MG tablet Take 2 tablets (650 mg total) by mouth every 6 (six) hours as needed for fever, headache or moderate pain.    ? ALPRAZolam (XANAX) 0.5 MG tablet TAKE 1/2 - 1 TABLETS (0.25-0.5 MG TOTAL) BY MOUTH AT BEDTIME. 30 tablet 0  ? Calcium Carb-Cholecalciferol (CALCIUM 600/VITAMIN D3) 600-800 MG-UNIT TABS Take 2 tablets by mouth daily. 60 tablet   ? capecitabine (XELODA) 500 MG tablet Take 2 tablets (1,000 mg total) by mouth 2 (two) times daily after a meal. 14 days on and 7 days off 56 tablet 7  ? Cholecalciferol (VITAMIN D) 50 MCG (2000 UT) CAPS Take 1 capsule (2,000 Units total) by mouth daily. 30 capsule   ? denosumab (PROLIA) 60 MG/ML SOSY injection INJECT 60MG SUBCUTANEOUSLY  EVERY 6 MONTHS 1 mL 0  ? dicyclomine (BENTYL) 10 MG capsule Take 1 capsule (10 mg total) by mouth 4 (four) times daily -  before meals and at bedtime. Wink  capsule 0  ? DULoxetine (CYMBALTA) 30 MG capsule Take 1 capsule (30 mg total) by mouth 2 (two) times daily. 180 capsule 1  ? gabapentin (NEURONTIN) 100 MG capsule Take 100 mg by mouth 3 (three) times daily.    ? loratadine (CLARITIN) 10 MG tablet Take 10 mg by mouth as needed for allergies.    ? Multiple Vitamin (MULTIVITAMIN WITH MINERALS) TABS tablet Take 1 tablet by mouth at bedtime.    ? SUMAtriptan  (IMITREX) 100 MG tablet TAKE 1 TABLET BY MOUTH ONCE FOR 1 DOSE. MAY REPEAT IN 2 HOURS IF HEADACHE PERSISTS OR RECURS. 9 tablet 3  ? traMADol HCl 100 MG TABS Take 100 mg by mouth 3 (three) times daily. 90 tablet 0  ? vitamin B-12 (V-R VITAMIN B-12) 500 MCG tablet Take 1 tablet (500 mcg total) by mouth daily.    ? ?No current facility-administered medications for this visit.  ? ? ?PHYSICAL EXAMINATION: ?ECOG PERFORMANCE STATUS: 1 - Symptomatic but completely ambulatory ? ?Vitals:  ? 03/07/22 1318  ?BP: 127/83  ?Pulse: 64  ?Resp: 17  ?Temp: 97.8 ?F (36.6 ?C)  ?SpO2: 99%  ? ?Filed Weights  ? 03/07/22 1318  ?Weight: 175 lb 14.4 oz (79.8 kg)  ? ? ? ?LABORATORY DATA:  ?I have reviewed the data as listed ? ?  Latest Ref Rng & Units 02/08/2022  ?  8:01 AM 01/15/2022  ?  9:00 AM 08/21/2021  ? 10:40 AM  ?CMP  ?Glucose 70 - 99 mg/dL 101   89   145    ?BUN 8 - 23 mg/dL _0 ?Creatinine 0.44 - 1.00 mg/dL 0.79   0.81   0.76    ?Sodium 135 - 145 mmol/L 138   136   137    ?Potassium 3.5 - 5.1 mmol/L 4.1   4.1   4.1    ?Chloride 98 - 111 mmol/L 104   101   103    ?CO2 22 - 32 mmol/L _1 ?Calcium 8.9 - 10.3 mg/dL 9.5   9.6   9.7    ?Total Protein 6.5 - 8.1 g/dL 7.1   6.5   7.2    ?Total Bilirubin 0.3 - 1.2 mg/dL 0.5   0.5   0.3    ?Alkaline Phos 38 - 126 U/L 101   103   65    ?AST 15 - 41 U/L _2 ?ALT 0 - 44 U/L _3 ? ? ?Lab Results  ?Component Value Date  ? WBC 7.9 03/07/2022  ? HGB 13.0 03/07/2022  ? HCT 39.6 03/07/2022  ? MCV 92.1 03/07/2022  ? PLT 341 03/07/2022  ? NEUTROABS 5.8 03/07/2022  ? ? ?ASSESSMENT & PLAN:  ?Malignant neoplasm of upper-inner quadrant of right breast in female, estrogen receptor negative ([place]) ?History of left breast cancer status postlumpectomy 2003 status post chemo with Adriamycin, radiation ?06/05/2021: Pain in the right breast: Mammogram and ultrasound revealed indeterminate masses in the right breast spanning 2.4 cm biopsy revealed grade 3 IDC ER 5%, PR 0%,  Ki67 60%, HER2 negative ?  ?Treatment plan: ?1.  Neoadjuvant chemotherapy with Taxotere and Cytoxan x4 (06/19/21- 08/21/21) ?2. 10/05/2021: Breast conserving surgery with sentinel lymph node biopsy: 0.4 cm grade 2 IDC with DCIS 0/6 LN Neg, ER 0%, PR 0%, Ki-67 5%, HER2 0  ?  3.  Adjuvant radiation therapy 11/09/2021-12/24/2021 ?4.  Based on final pathology being ER negative, I did not recommend antiestrogen therapy.  Capecitabine started 01/02/2022 discontinued 02/21/2022 because of adverse effects especially nausea ?----------------------------------------------------------------------------------------------------------------------- ?Patient is feeling much better since she stopped capecitabine. ?We ordered a bone density and mammogram to be done in July ?  ?Return to clinic in December for follow-up for breast exams. ? ? ?No orders of the defined types were placed in this encounter. ? ?The patient has a good understanding of the overall plan. she agrees with it. she will call with any problems that may develop before the next visit here. ?Total time spent: 30 mins including face to face time and time spent for planning, charting and co-ordination of care ? ? Harriette Ohara, MD ?03/07/22 ? ? ? ?

## 2022-03-07 NOTE — Progress Notes (Signed)
Per orders of Allie Bossier, in Dr. Danise Mina' absence, an injection of Prolia given by Loreen Freud. ?Patient tolerated injection well.  ?

## 2022-03-07 NOTE — Assessment & Plan Note (Signed)
History of left breast cancer status postlumpectomy 2003?status post chemo with Adriamycin, radiation ?06/05/2021:?Pain in the right breast: Mammogram and ultrasound revealed indeterminate masses in the right breast spanning 2.4 cm biopsy revealed grade 3 IDC ER 5%, PR 0%, Ki67 60%, HER2 negative ?? ?Treatment plan: ?1.??Neoadjuvant chemotherapy with Taxotere?and Cytoxan?x4?(06/19/21- 08/21/21) ?2.?10/05/2021: Breast conserving surgery with sentinel lymph node biopsy: 0.4 cm grade 2 IDC with DCIS 0/6 LN Neg, ER 0%, PR 0%, Ki-67 5%, HER2 0  ?3.??Adjuvant radiation therapy?11/09/2021-12/24/2021 ?4.??Based on final pathology being ER negative, I did not recommend antiestrogen therapy.  Capecitabine started 01/02/2022 ?----------------------------------------------------------------------------------------------------------------------- ?Capecitabine toxicities: ?1. Nausea and bloating sensation: I discussed with her about taking Compazine ahead of the medication so that she can prevent the nausea.  She is willing to try that.  She wants to continue with the full dosage as prescribed. ?2. Severe constipation: Instead of having diarrhea she is having constipation issues. ?? ?Return to clinic in  2 months for follow-up ?

## 2022-03-08 ENCOUNTER — Other Ambulatory Visit (HOSPITAL_COMMUNITY): Payer: Self-pay

## 2022-03-08 ENCOUNTER — Other Ambulatory Visit: Payer: Self-pay | Admitting: Hematology and Oncology

## 2022-03-08 ENCOUNTER — Other Ambulatory Visit: Payer: Self-pay | Admitting: *Deleted

## 2022-03-08 DIAGNOSIS — Z171 Estrogen receptor negative status [ER-]: Secondary | ICD-10-CM

## 2022-03-11 ENCOUNTER — Inpatient Hospital Stay: Payer: BC Managed Care – PPO

## 2022-03-13 ENCOUNTER — Ambulatory Visit (HOSPITAL_COMMUNITY)
Admission: RE | Admit: 2022-03-13 | Discharge: 2022-03-13 | Disposition: A | Discharge status: Home / Self Care | Payer: BC Managed Care – PPO | Source: Ambulatory Visit | Attending: Adult Health | Admitting: Adult Health

## 2022-03-13 DIAGNOSIS — Z87891 Personal history of nicotine dependence: Secondary | ICD-10-CM | POA: Diagnosis not present

## 2022-03-13 DIAGNOSIS — F17210 Nicotine dependence, cigarettes, uncomplicated: Secondary | ICD-10-CM | POA: Diagnosis not present

## 2022-04-03 ENCOUNTER — Inpatient Hospital Stay: Payer: BC Managed Care – PPO

## 2022-04-03 ENCOUNTER — Inpatient Hospital Stay: Payer: BC Managed Care – PPO | Admitting: Hematology and Oncology

## 2022-04-09 ENCOUNTER — Other Ambulatory Visit: Payer: Self-pay | Admitting: Nurse Practitioner

## 2022-04-09 DIAGNOSIS — F331 Major depressive disorder, recurrent, moderate: Secondary | ICD-10-CM

## 2022-04-11 NOTE — Telephone Encounter (Signed)
I've refilled cymbalta '30mg'$  BID. Plan was to return for CPE around this time also for mood f/u.  Plz call to see how she's doing on higher cymbalta dose, offer CPE.

## 2022-04-12 ENCOUNTER — Ambulatory Visit (INDEPENDENT_AMBULATORY_CARE_PROVIDER_SITE_OTHER): Payer: BC Managed Care – PPO | Admitting: Family Medicine

## 2022-04-12 ENCOUNTER — Encounter: Payer: Self-pay | Admitting: Family Medicine

## 2022-04-12 VITALS — HR 84 | Temp 97.9°F | Ht 64.0 in | Wt 175.1 lb

## 2022-04-12 DIAGNOSIS — S300XXA Contusion of lower back and pelvis, initial encounter: Secondary | ICD-10-CM | POA: Diagnosis not present

## 2022-04-12 DIAGNOSIS — I447 Left bundle-branch block, unspecified: Secondary | ICD-10-CM

## 2022-04-12 DIAGNOSIS — I951 Orthostatic hypotension: Secondary | ICD-10-CM | POA: Diagnosis not present

## 2022-04-12 DIAGNOSIS — S40022A Contusion of left upper arm, initial encounter: Secondary | ICD-10-CM | POA: Diagnosis not present

## 2022-04-12 DIAGNOSIS — F410 Panic disorder [episodic paroxysmal anxiety] without agoraphobia: Secondary | ICD-10-CM

## 2022-04-12 DIAGNOSIS — M810 Age-related osteoporosis without current pathological fracture: Secondary | ICD-10-CM | POA: Diagnosis not present

## 2022-04-12 DIAGNOSIS — W19XXXA Unspecified fall, initial encounter: Secondary | ICD-10-CM | POA: Diagnosis not present

## 2022-04-12 DIAGNOSIS — F331 Major depressive disorder, recurrent, moderate: Secondary | ICD-10-CM

## 2022-04-12 NOTE — Assessment & Plan Note (Signed)
Fall sustained 2 days ago with bruise/contusion to L upper medial arm and R buttock - seems to be recovering well from this.  Suspicious for orthostatic cause of fall.  See below.

## 2022-04-12 NOTE — Assessment & Plan Note (Signed)
Continue cymbalta '30mg'$  bid.

## 2022-04-12 NOTE — Progress Notes (Signed)
Patient ID: Sabrina Wright, female    DOB: 08/27/1961, 61 y.o.   MRN: [id number]  This visit was conducted in person.  Pulse 84   Temp 97.9 F (36.6 C) (Temporal)   Ht '5\' 4"'$  (1.626 m)   Wt 175 lb 2 oz (79.4 kg)   LMP 02/14/2005 (Approximate)   SpO2 97%   BMI 30.06 kg/m   Orthostatic VS for the past 24 hrs (Last 3 readings):  BP- Lying BP- Standing at 3 minutes  04/12/22 1257 -- 108/58  04/12/22 1254 112/80 --     CC: fall at home with injury Subjective:   HPI: Sabrina Wright is a 61 y.o. female presenting on 04/12/2022 for Fall (Pt fell down stairs at home on 04/10/22 after feeling lightheaded. C/o bruise on R buttock and L arm. States this is 3rd fall in a 6 mo period.  )   DOI: 04/10/2022 In evening was sitting on front porch watching dogs, got up to go indoors, got lightheaded and fell forward, hit left inner upper arm and right buttock.  Difficulty determining if dizziness due to vertigo or presyncope.  No LOC.  No chest pain, tightness, shortness of breath. Intermittent palpitations.  No peripheral neuropathy symptoms.  She stays well hydrated, drinks plenty of water. She does limit salt in diet given husband's dietary limitations (CAD hx)  OP - last prolia injection 03/07/2022.   Has been set up for cardiology evaluation for 08/2022.  Prior cardiac evaluation 02/2021 reassuringly negative for ischemia. Known LBBB, seen for palpitations.  Echocardiogram 09/2020 - WNL  Heart monitor x2 2wks 09/2020 - showed SVT, longest 13 beats  Coronary CTA 12/2020 - normal coronary arteries with calcium score of 0  Stable period on cymbalta '30mg'$  bid - desires to continue this.  Requests xanax refill - advised has refills available at pharmacy      Relevant past medical, surgical, family and social history reviewed and updated as indicated. Interim medical history since our last visit reviewed. Allergies and medications reviewed and updated. Outpatient Medications Prior to Visit   Medication Sig Dispense Refill   acetaminophen (TYLENOL) 325 MG tablet Take 2 tablets (650 mg total) by mouth every 6 (six) hours as needed for fever, headache or moderate pain.     ALPRAZolam (XANAX) 0.5 MG tablet TAKE 1/2 - 1 TABLETS (0.25-0.5 MG TOTAL) BY MOUTH AT BEDTIME. 30 tablet 3   Calcium Carb-Cholecalciferol (CALCIUM 600/VITAMIN D3) 600-800 MG-UNIT TABS Take 2 tablets by mouth daily. 60 tablet    Cholecalciferol (VITAMIN D) 50 MCG (2000 UT) CAPS Take 1 capsule (2,000 Units total) by mouth daily. 30 capsule    denosumab (PROLIA) 60 MG/ML SOSY injection INJECT '60MG'$  SUBCUTANEOUSLY  EVERY 6 MONTHS 1 mL 0   DULoxetine (CYMBALTA) 30 MG capsule Take 1 capsule (30 mg total) by mouth 2 (two) times daily. 180 capsule 01   gabapentin (NEURONTIN) 100 MG capsule Take 100 mg by mouth 3 (three) times daily.     Multiple Vitamin (MULTIVITAMIN WITH MINERALS) TABS tablet Take 1 tablet by mouth at bedtime.     SUMAtriptan (IMITREX) 100 MG tablet TAKE 1 TABLET BY MOUTH ONCE FOR 1 DOSE. MAY REPEAT IN 2 HOURS IF HEADACHE PERSISTS OR RECURS. 9 tablet 3   vitamin B-12 (V-R VITAMIN B-12) 500 MCG tablet Take 1 tablet (500 mcg total) by mouth daily.     traMADol (ULTRAM) 50 MG tablet Take 1 tablet (50 mg total) by mouth every 6 (six) hours  as needed. (Patient not taking: Reported on 04/12/2022) 60 tablet 1   dicyclomine (BENTYL) 10 MG capsule Take 1 capsule (10 mg total) by mouth 4 (four) times daily -  before meals and at bedtime. 56 capsule 0   loratadine (CLARITIN) 10 MG tablet Take 10 mg by mouth as needed for allergies.     No facility-administered medications prior to visit.     Per HPI unless specifically indicated in ROS section below Review of Systems  Objective:  Pulse 84   Temp 97.9 F (36.6 C) (Temporal)   Ht '5\' 4"'$  (1.626 m)   Wt 175 lb 2 oz (79.4 kg)   LMP 02/14/2005 (Approximate)   SpO2 97%   BMI 30.06 kg/m   Wt Readings from Last 3 Encounters:  04/12/22 175 lb 2 oz (79.4 kg)  03/07/22  175 lb 14.4 oz (79.8 kg)  02/20/22 177 lb 2 oz (80.3 kg)      Physical Exam Vitals and nursing note reviewed.  Constitutional:      Appearance: Normal appearance. She is not ill-appearing.  Neck:     Vascular: No carotid bruit.  Cardiovascular:     Rate and Rhythm: Normal rate and regular rhythm.     Pulses: Normal pulses.     Heart sounds: Normal heart sounds. No murmur heard. Pulmonary:     Effort: Pulmonary effort is normal. No respiratory distress.     Breath sounds: Normal breath sounds. No wheezing, rhonchi or rales.  Musculoskeletal:        General: Tenderness present.     Cervical back: Normal range of motion and neck supple.     Right lower leg: No edema.     Left lower leg: No edema.     Comments: FROM at L shoulder and elbow  Skin:    General: Skin is warm and dry.     Findings: Bruising present. No rash.     Comments: Moderate size bruise to L upper medial arm without obvious hematoma  Neurological:     Mental Status: She is alert.  Psychiatric:        Mood and Affect: Mood normal.        Behavior: Behavior normal.       Results for orders placed or performed in visit on 03/07/22  CBC with Differential ([facility]cer Center Only)  Result Value Ref Range   WBC Count 7.9 4.0 - 10.5 K/uL   RBC 4.30 3.87 - 5.11 MIL/uL   Hemoglobin 13.0 12.0 - 15.0 g/dL   HCT 39.6 36.0 - 46.0 %   MCV 92.1 80.0 - 100.0 fL   MCH 30.2 26.0 - 34.0 pg   MCHC 32.8 30.0 - 36.0 g/dL   RDW 17.3 (H) 11.5 - 15.5 %   Platelet Count 341 150 - 400 K/uL   nRBC 0.0 0.0 - 0.2 %   Neutrophils Relative % 73 %   Neutro Abs 5.8 1.7 - 7.7 K/uL   Lymphocytes Relative 13 %   Lymphs Abs 1.0 0.7 - 4.0 K/uL   Monocytes Relative 9 %   Monocytes Absolute 0.7 0.1 - 1.0 K/uL   Eosinophils Relative 4 %   Eosinophils Absolute 0.3 0.0 - 0.5 K/uL   Basophils Relative 1 %   Basophils Absolute 0.1 0.0 - 0.1 K/uL   Immature Granulocytes 0 %   Abs Immature Granulocytes 0.02 0.00 - 0.07 K/uL  CMP (Cancer Center  only)  Result Value Ref Range   Sodium 137 135 - 145  mmol/L   Potassium 4.1 3.5 - 5.1 mmol/L   Chloride 104 98 - 111 mmol/L   CO2 26 22 - 32 mmol/L   Glucose, Bld 125 (H) 70 - 99 mg/dL   BUN 12 8 - 23 mg/dL   Creatinine 0.79 0.44 - 1.00 mg/dL   Calcium 9.3 8.9 - 10.3 mg/dL   Total Protein 7.1 6.5 - 8.1 g/dL   Albumin 4.0 3.5 - 5.0 g/dL   AST 16 15 - 41 U/L   ALT 11 0 - 44 U/L   Alkaline Phosphatase 109 38 - 126 U/L   Total Bilirubin 0.3 0.3 - 1.2 mg/dL   GFR, Estimated >60 >60 mL/min   Anion gap 7 5 - 15   Lab Results  Component Value Date   TSH 2.08 09/16/2019    EKG - NSR rate 70, normal axis, intervals, short PR, LBBB without acute ST/T changes  Assessment & Plan:   Problem List Items Addressed This Visit     Osteoporosis   Anxiety attack    Continue cymbalta '30mg'$  bid She has xanax refills available at pharmacy.      LBBB (left bundle branch block)   MDD (major depressive disorder), recurrent episode, moderate ([facility])    Continue cymbalta '30mg'$  bid.       Fall with injury - Primary    Fall sustained 2 days ago with bruise/contusion to L upper medial arm and R buttock - seems to be recovering well from this.  Suspicious for orthostatic cause of fall.  See below.      Orthostatic hypotension    Orthostatic vital signs with 67+ drop in diastolic BPs - anticipate contributing to recent dizziness with falls.  Reviewed supportive measures to include good water/fluid intake, liberalizing salt intake, start using compression stockings (Rx provided).  Update if ongoing symptoms for sooner cards f/u in h/o SVT. Pt agrees with plan.       Relevant Orders   EKG 12-Lead (Completed)     No orders of the defined types were placed in this encounter.  Orders Placed This Encounter  Procedures   EKG 12-Lead     Patient Instructions  EKG today  You can try to increase salt intake by a little bit.  Start compression stockings - prescription provided today.  You can go to  local durable medical supply store to get this fitted.  Ensure you're staying well hydrated with plenty of water.  Let us know if not better with above, we may have you see the heart doctor sooner than October.   Follow up plan: No follow-ups on file.  [name], MD

## 2022-04-12 NOTE — Patient Instructions (Addendum)
EKG today  You can try to increase salt intake by a little bit.  Start compression stockings - prescription provided today.  You can go to local durable medical supply store to get this fitted.  Ensure you're staying well hydrated with plenty of water.  Let us know if not better with above, we may have you see the heart doctor sooner than October.

## 2022-04-12 NOTE — Assessment & Plan Note (Signed)
Continue cymbalta '30mg'$  bid She has xanax refills available at pharmacy.

## 2022-04-12 NOTE — Assessment & Plan Note (Signed)
Orthostatic vital signs with 94+ drop in diastolic BPs - anticipate contributing to recent dizziness with falls.  Reviewed supportive measures to include good water/fluid intake, liberalizing salt intake, start using compression stockings (Rx provided).  Update if ongoing symptoms for sooner cards f/u in h/o SVT. Pt agrees with plan.

## 2022-04-22 ENCOUNTER — Telehealth: Payer: Self-pay | Admitting: *Deleted

## 2022-04-22 NOTE — Telephone Encounter (Signed)
RETURNED PATIENT'S PHONE CALL, PATIENT CANCELLED THURSDAY'S FU APPT. DUE TO THE FACT THAT SHE THINKS THAT SHE HAS THE FLU, I ASKED THE PATIENT IF SHE IS INTERESTED IN RESCHEDULING THIS APPT., PATIENT STATED THAT SHE DOESN'T CARE TO RESCHEDULE THIS APPT. AND SHE THINKS THAT SHE DOESN'T NEED TO COME BACK

## 2022-04-23 ENCOUNTER — Encounter: Payer: Self-pay | Admitting: Family

## 2022-04-23 ENCOUNTER — Ambulatory Visit (INDEPENDENT_AMBULATORY_CARE_PROVIDER_SITE_OTHER): Payer: BC Managed Care – PPO | Admitting: Family

## 2022-04-23 VITALS — BP 128/60 | HR 93 | Temp 98.5°F | Resp 16 | Ht 64.0 in | Wt 175.1 lb

## 2022-04-23 DIAGNOSIS — J029 Acute pharyngitis, unspecified: Secondary | ICD-10-CM

## 2022-04-23 DIAGNOSIS — J020 Streptococcal pharyngitis: Secondary | ICD-10-CM | POA: Diagnosis not present

## 2022-04-23 LAB — POCT RAPID STREP A (OFFICE): Rapid Strep A Screen: POSITIVE — AB

## 2022-04-23 MED ORDER — AMOXICILLIN 500 MG PO CAPS: 500.0000 mg | ORAL_CAPSULE | Freq: Two times a day (BID) | ORAL | 0 refills | Status: AC

## 2022-04-23 NOTE — Assessment & Plan Note (Signed)
Strep tested positive in office.  rx for amox 500 mg po bid x 10 days.  Ibuprofen/tyelnol prn sore throat/fever Pt told to F/u if no improvement in the next 2-3 days.  

## 2022-04-23 NOTE — Assessment & Plan Note (Signed)
Strep tested in office, positive Warm salt water gargles   
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
## 2022-04-23 NOTE — Patient Instructions (Signed)
You were found to be strep positive,  Take antibiotics that have been sent to the pharmacy.  Change your toothbrush after 24 hours on the antibiotics.  Gargle with warm salt water as needed for sore throat.   Due to recent changes in healthcare laws, you may see results of your imaging and/or laboratory studies on MyChart before I have had a chance to review them.  I understand that in some cases there may be results that are confusing or concerning to you. Please understand that not all results are received at the same time and often I may need to interpret multiple results in order to provide you with the best plan of care or course of treatment. Therefore, I ask that you please give me 2 business days to thoroughly review all your results before contacting my office for clarification. Should we see a critical lab result, you will be contacted sooner.   It was a pleasure seeing you today! Please do not hesitate to reach out with any questions and or concerns.  Regards,   Raychel Dowler FNP-C    

## 2022-04-25 ENCOUNTER — Ambulatory Visit: Payer: Self-pay | Admitting: Radiation Oncology

## 2022-05-02 DIAGNOSIS — Z853 Personal history of malignant neoplasm of breast: Secondary | ICD-10-CM | POA: Diagnosis not present

## 2022-05-02 DIAGNOSIS — Z452 Encounter for adjustment and management of vascular access device: Secondary | ICD-10-CM | POA: Diagnosis not present

## 2022-05-30 ENCOUNTER — Ambulatory Visit
Admission: RE | Admit: 2022-05-30 | Discharge: 2022-05-30 | Disposition: A | Discharge status: Home / Self Care | Payer: BC Managed Care – PPO | Source: Ambulatory Visit | Attending: Hematology and Oncology | Admitting: Hematology and Oncology

## 2022-05-30 DIAGNOSIS — R928 Other abnormal and inconclusive findings on diagnostic imaging of breast: Secondary | ICD-10-CM | POA: Diagnosis not present

## 2022-05-30 DIAGNOSIS — Z853 Personal history of malignant neoplasm of breast: Secondary | ICD-10-CM | POA: Diagnosis not present

## 2022-05-30 DIAGNOSIS — Z171 Estrogen receptor negative status [ER-]: Secondary | ICD-10-CM

## 2022-06-05 DIAGNOSIS — C50411 Malignant neoplasm of upper-outer quadrant of right female breast: Secondary | ICD-10-CM | POA: Diagnosis not present

## 2022-06-17 ENCOUNTER — Telehealth (INDEPENDENT_AMBULATORY_CARE_PROVIDER_SITE_OTHER): Payer: 59 | Admitting: Internal Medicine

## 2022-06-17 ENCOUNTER — Telehealth: Payer: BC Managed Care – PPO | Admitting: Family Medicine

## 2022-06-17 ENCOUNTER — Encounter: Payer: Self-pay | Admitting: Internal Medicine

## 2022-06-17 ENCOUNTER — Encounter: Payer: Self-pay | Admitting: Hematology and Oncology

## 2022-06-17 VITALS — Ht 64.0 in | Wt 170.0 lb

## 2022-06-17 DIAGNOSIS — U071 COVID-19: Secondary | ICD-10-CM | POA: Diagnosis not present

## 2022-06-17 DIAGNOSIS — J9600 Acute respiratory failure, unspecified whether with hypoxia or hypercapnia: Secondary | ICD-10-CM | POA: Diagnosis not present

## 2022-06-17 MED ORDER — NIRMATRELVIR/RITONAVIR (PAXLOVID)TABLET: 3.0000 | ORAL_TABLET | Freq: Two times a day (BID) | ORAL | 0 refills | Status: AC
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
## 2022-07-02 ENCOUNTER — Encounter: Payer: Self-pay | Admitting: Gastroenterology

## 2022-07-03 ENCOUNTER — Other Ambulatory Visit: Payer: Self-pay

## 2022-07-03 ENCOUNTER — Ambulatory Visit: Payer: 59 | Attending: General Surgery | Admitting: Rehabilitation

## 2022-07-03 ENCOUNTER — Encounter: Payer: Self-pay | Admitting: Rehabilitation

## 2022-07-03 DIAGNOSIS — C50211 Malignant neoplasm of upper-inner quadrant of right female breast: Secondary | ICD-10-CM | POA: Insufficient documentation

## 2022-07-03 DIAGNOSIS — M79601 Pain in right arm: Secondary | ICD-10-CM | POA: Diagnosis not present

## 2022-07-03 DIAGNOSIS — Z171 Estrogen receptor negative status [ER-]: Secondary | ICD-10-CM | POA: Insufficient documentation

## 2022-07-03 DIAGNOSIS — Z483 Aftercare following surgery for neoplasm: Secondary | ICD-10-CM | POA: Insufficient documentation

## 2022-07-03 DIAGNOSIS — N644 Mastodynia: Secondary | ICD-10-CM | POA: Insufficient documentation
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
## 2022-07-05 HISTORY — PX: COLONOSCOPY: SHX174

## 2022-07-16 ENCOUNTER — Telehealth: Payer: Self-pay | Admitting: *Deleted

## 2022-07-16 ENCOUNTER — Ambulatory Visit (AMBULATORY_SURGERY_CENTER): Payer: Self-pay | Admitting: *Deleted

## 2022-07-16 ENCOUNTER — Encounter: Payer: Self-pay | Admitting: Gastroenterology

## 2022-07-16 VITALS — Ht 64.0 in | Wt 175.8 lb

## 2022-07-16 DIAGNOSIS — Z860100 Personal history of colon polyps, unspecified: Secondary | ICD-10-CM

## 2022-07-16 MED ORDER — NA SULFATE-K SULFATE-MG SULF 17.5-3.13-1.6 GM/177ML PO SOLN: 1.0000 | Freq: Once | ORAL | 0 refills | Status: AC

## 2022-07-16 NOTE — Telephone Encounter (Signed)
Pt. In for pre-visit today,procedure scheduled for 07/30/22,pt.has a h/o LBBB and has a cardiology appointment scheduled in December not having any problems and was dx with breast ca to right august of 2022 can pt.proceed with colon as scheduled please advise?

## 2022-07-16 NOTE — Progress Notes (Signed)
No egg make pt. Vomit but if in a cake eggs do not bother pt. Had profol before did fine with it.or soy allergy known to patient  No issues known to pt with past sedation with any surgeries or procedures Patient denies ever being told they had issues or difficulty with intubation  No FH of Malignant Hyperthermia Pt is not on diet pills Pt is not on  home 02  Pt is not on blood thinners  Pt denies issues with constipation  No A fib or A flutter Have any cardiac testing pending--no,but will be seeing cariologist for f/u of LBBB History. Pt instructed to use [email or web address] or GoodRx for a price reduction on prep

## 2022-07-17 NOTE — Telephone Encounter (Signed)
I am ccing John to review cardiac history and to check if she is appropriate for [place]. Thanks

## 2022-07-17 NOTE — Telephone Encounter (Signed)
Freda Munro and Dr. Silverio Decamp,  This pt is cleared for anesthetic care at [facility].    Thanks,  Osvaldo Angst

## 2022-07-17 NOTE — Telephone Encounter (Signed)
Noted  

## 2022-07-30 ENCOUNTER — Ambulatory Visit (AMBULATORY_SURGERY_CENTER): Payer: 59 | Admitting: Gastroenterology

## 2022-07-30 ENCOUNTER — Encounter: Payer: Self-pay | Admitting: Gastroenterology

## 2022-07-30 VITALS — BP 99/47 | HR 69 | Temp 96.9°F | Resp 12 | Ht 64.0 in | Wt 175.8 lb

## 2022-07-30 DIAGNOSIS — Z09 Encounter for follow-up examination after completed treatment for conditions other than malignant neoplasm: Secondary | ICD-10-CM | POA: Diagnosis not present

## 2022-07-30 DIAGNOSIS — Z1211 Encounter for screening for malignant neoplasm of colon: Secondary | ICD-10-CM | POA: Diagnosis not present

## 2022-07-30 DIAGNOSIS — J449 Chronic obstructive pulmonary disease, unspecified: Secondary | ICD-10-CM | POA: Diagnosis not present

## 2022-07-30 DIAGNOSIS — K621 Rectal polyp: Secondary | ICD-10-CM

## 2022-07-30 DIAGNOSIS — Z86010 Personal history of colonic polyps: Secondary | ICD-10-CM | POA: Diagnosis not present

## 2022-07-30 DIAGNOSIS — D128 Benign neoplasm of rectum: Secondary | ICD-10-CM | POA: Diagnosis not present

## 2022-07-30 DIAGNOSIS — R69 Illness, unspecified: Secondary | ICD-10-CM | POA: Diagnosis not present

## 2022-07-30 MED ORDER — SODIUM CHLORIDE 0.9 % IV SOLN: 500.0000 mL | Freq: Once | INTRAVENOUS | Status: DC

## 2022-07-30 NOTE — Op Note (Signed)
[place] Patient Name: Sabrina Wright Procedure Date: 07/30/2022 9:54 AM MRN: [id number] Endoscopist: Mauri Pole , MD Age: 61 Referring MD:  Date of Birth: 1961-08-21 Gender: Female Account #: [id number] Procedure:                Colonoscopy Indications:              High risk colon cancer surveillance: Personal                            history of colonic polyps, High risk colon cancer                            surveillance: Personal history of adenoma less than                            10 mm in size Medicines:                Monitored Anesthesia Care Procedure:                Pre-Anesthesia Assessment:                           - Prior to the procedure, a History and Physical                            was performed, and patient medications and                            allergies were reviewed. The patient's tolerance of                            previous anesthesia was also reviewed. The risks                            and benefits of the procedure and the sedation                            options and risks were discussed with the patient.                            All questions were answered, and informed consent                            was obtained. Prior Anticoagulants: The patient has                            taken no previous anticoagulant or antiplatelet                            agents. ASA Grade Assessment: II - A patient with                            mild systemic disease. After reviewing the risks  and benefits, the patient was deemed in                            satisfactory condition to undergo the procedure.                           After obtaining informed consent, the colonoscope                            was passed under direct vision. Throughout the                            procedure, the patient's blood pressure, pulse, and                            oxygen saturations were monitored continuously.  The                            PCF-HQ190L Colonoscope was introduced through the                            anus and advanced to the the cecum, identified by                            appendiceal orifice and ileocecal valve. The                            colonoscopy was performed without difficulty. The                            patient tolerated the procedure well. The quality                            of the bowel preparation was good. The ileocecal                            valve, appendiceal orifice, and rectum were                            photographed. Scope In: 9:59:54 AM Scope Out: 10:14:08 AM Scope Withdrawal Time: 0 hours 9 minutes 41 seconds  Total Procedure Duration: 0 hours 14 minutes 14 seconds  Findings:                 The perianal and digital rectal examinations were                            normal.                           A 7 mm polyp was found in the rectum. The polyp was                            sessile. The polyp was removed with a cold snare.  Resection and retrieval were complete.                           Scattered small-mouthed diverticula were found in                            the sigmoid colon and descending colon.                           Non-bleeding external and internal hemorrhoids were                            found during retroflexion. The hemorrhoids were                            medium-sized. Complications:            No immediate complications. Estimated Blood Loss:     Estimated blood loss was minimal. Impression:               - One 7 mm polyp in the rectum, removed with a cold                            snare. Resected and retrieved.                           - Diverticulosis in the sigmoid colon and in the                            descending colon.                           - Non-bleeding external and internal hemorrhoids. Recommendation:           - Patient has a contact number available for                             emergencies. The signs and symptoms of potential                            delayed complications were discussed with the                            patient. Return to normal activities tomorrow.                            Written discharge instructions were provided to the                            patient.                           - Resume previous diet.                           - Continue present medications.                           -  Await pathology results.                           - Repeat colonoscopy in 5-10 years for surveillance                            based on pathology results. Mauri Pole, MD 07/30/2022 10:19:10 AM This report has been signed electronically.

## 2022-07-30 NOTE — Progress Notes (Signed)
Pt's states no medical or surgical changes since previsit or office visit. 

## 2022-07-30 NOTE — Progress Notes (Signed)
Pt resting comfortably. VSS. Airway intact. SBAR complete to RN. All questions answered.   

## 2022-07-30 NOTE — Patient Instructions (Addendum)
-   Patient has a contact number available for emergencies. The signs and symptoms of potential delayed complications were discussed with the patient. Return to normal activities tomorrow. Written discharge instructions were provided to the patient. - Resume previous diet. - Continue present medications. - Await pathology results. - Repeat colonoscopy in 5-10 years.  Handouts on Diverticulosis, polyps and hemorrhoids given.   YOU HAD AN ENDOSCOPIC PROCEDURE TODAY AT [facility]:   Refer to the procedure report that was given to you for any specific questions about what was found during the examination.  If the procedure report does not answer your questions, please call your gastroenterologist to clarify.  If you requested that your care partner not be given the details of your procedure findings, then the procedure report has been included in a sealed envelope for you to review at your convenience later.  YOU SHOULD EXPECT: Some feelings of bloating in the abdomen. Passage of more gas than usual.  Walking can help get rid of the air that was put into your GI tract during the procedure and reduce the bloating. If you had a lower endoscopy (such as a colonoscopy or flexible sigmoidoscopy) you may notice spotting of blood in your stool or on the toilet paper. If you underwent a bowel prep for your procedure, you may not have a normal bowel movement for a few days.  Please Note:  You might notice some irritation and congestion in your nose or some drainage.  This is from the oxygen used during your procedure.  There is no need for concern and it should clear up in a day or so.  SYMPTOMS TO REPORT IMMEDIATELY:  Following lower endoscopy (colonoscopy or flexible sigmoidoscopy):  Excessive amounts of blood in the stool  Significant tenderness or worsening of abdominal pains  Swelling of the abdomen that is new, acute  Fever of 100F or higher  For urgent or emergent issues, a  gastroenterologist can be reached at any hour by calling [phone]. Do not use MyChart messaging for urgent concerns.    DIET:  We do recommend a small meal at first, but then you may proceed to your regular diet.  Drink plenty of fluids but you should avoid alcoholic beverages for 24 hours.  ACTIVITY:  You should plan to take it easy for the rest of today and you should NOT DRIVE or use heavy machinery until tomorrow (because of the sedation medicines used during the test).    FOLLOW UP: Our staff will call the number listed on your records the next business day following your procedure.  We will call around 7:15- 8:00 am to check on you and address any questions or concerns that you may have regarding the information given to you following your procedure. If we do not reach you, we will leave a message.     If any biopsies were taken you will be contacted by phone or by letter within the next 1-3 weeks.  Please call us at [phone] if you have not heard about the biopsies in 3 weeks.    SIGNATURES/CONFIDENTIALITY: You and/or your care partner have signed paperwork which will be entered into your electronic medical record.  These signatures attest to the fact that that the information above on your After Visit Summary has been reviewed and is understood.  Full responsibility of the confidentiality of this discharge information lies with you and/or your care-partner.
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
## 2022-07-31 ENCOUNTER — Telehealth: Payer: Self-pay

## 2022-07-31 NOTE — Telephone Encounter (Signed)
  Follow up Call-     07/30/2022    8:48 AM  Call back number  Post procedure Call Back phone  # [phone]  Permission to leave phone message Yes     Patient questions:  Do you have a fever, pain , or abdominal swelling? No. Pain Score  0 *  Have you tolerated food without any problems? Yes.    Have you been able to return to your normal activities? Yes.    Do you have any questions about your discharge instructions: Diet   No. Medications  No. Follow up visit  No.  Do you have questions or concerns about your Care? No.  Actions: * If pain score is 4 or above: No action needed, pain <4.

## 2022-08-02 ENCOUNTER — Other Ambulatory Visit: Payer: Self-pay | Admitting: Hematology and Oncology

## 2022-08-02 DIAGNOSIS — Z171 Estrogen receptor negative status [ER-]: Secondary | ICD-10-CM

## 2022-08-08 ENCOUNTER — Encounter: Payer: Self-pay | Admitting: Gastroenterology

## 2022-08-12 ENCOUNTER — Other Ambulatory Visit: Payer: Self-pay | Admitting: Family Medicine

## 2022-08-27 ENCOUNTER — Encounter: Payer: Self-pay | Admitting: Cardiology

## 2022-08-27 ENCOUNTER — Ambulatory Visit: Payer: 59 | Attending: Cardiology | Admitting: Cardiology

## 2022-08-27 VITALS — BP 98/62 | HR 84 | Ht 64.0 in | Wt 177.4 lb

## 2022-08-27 DIAGNOSIS — R079 Chest pain, unspecified: Secondary | ICD-10-CM

## 2022-08-27 DIAGNOSIS — R9431 Abnormal electrocardiogram [ECG] [EKG]: Secondary | ICD-10-CM | POA: Diagnosis not present

## 2022-08-27 DIAGNOSIS — R002 Palpitations: Secondary | ICD-10-CM

## 2022-08-27 DIAGNOSIS — R42 Dizziness and giddiness: Secondary | ICD-10-CM | POA: Diagnosis not present

## 2022-08-27 DIAGNOSIS — I447 Left bundle-branch block, unspecified: Secondary | ICD-10-CM | POA: Diagnosis not present
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
## 2022-08-27 NOTE — Patient Instructions (Signed)
Medication Instructions:  Your physician recommends that you continue on your current medications as directed. Please refer to the Current Medication list given to you today.  *If you need a refill on your cardiac medications before your next appointment, please call your pharmacy*  Testing/Procedures: Your physician has requested that you have an echocardiogram. Echocardiography is a painless test that uses sound waves to create images of your heart. It provides your doctor with information about the size and shape of your heart and how well your heart's chambers and valves are working. This procedure takes approximately one hour. There are no restrictions for this procedure. Please do NOT wear cologne, perfume, aftershave, or lotions (deodorant is allowed). Please arrive 15 minutes prior to your appointment time.   Follow-Up: At [facility], you and your health needs are our priority.  As part of our continuing mission to provide you with exceptional heart care, we have created designated Provider Care Teams.  These Care Teams include your primary Cardiologist (physician) and Advanced Practice Providers (APPs -  Physician Assistants and Nurse Practitioners) who all work together to provide you with the care you need, when you need it.  We recommend signing up for the patient portal called "MyChart".  Sign up information is provided on this After Visit Summary.  MyChart is used to connect with patients for Virtual Visits (Telemedicine).  Patients are able to view lab/test results, encounter notes, upcoming appointments, etc.  Non-urgent messages can be sent to your provider as well.   To learn more about what you can do with MyChart, go to [email or web address].    Your next appointment:   12 month(s)  The format for your next appointment:   In Person  Provider:   Dr. Schumann     

## 2022-09-03 ENCOUNTER — Inpatient Hospital Stay: Payer: BC Managed Care – PPO | Admitting: Adult Health

## 2022-09-11 ENCOUNTER — Ambulatory Visit (HOSPITAL_COMMUNITY): Payer: 59 | Attending: Cardiology

## 2022-09-11 DIAGNOSIS — R9431 Abnormal electrocardiogram [ECG] [EKG]: Secondary | ICD-10-CM | POA: Insufficient documentation

## 2022-09-11 LAB — ECHOCARDIOGRAM COMPLETE
Area-P 1/2: 2.91 cm2
S' Lateral: 2.6 cm

## 2022-09-11 MED ORDER — PERFLUTREN LIPID MICROSPHERE
1.0000 mL | INTRAVENOUS | Status: AC | PRN
  Administered 2022-09-11: 2 mL via INTRAVENOUS
  Administered 2022-09-11: 1 mL via INTRAVENOUS

## 2022-09-12 ENCOUNTER — Telehealth: Payer: Self-pay

## 2022-09-12 DIAGNOSIS — M810 Age-related osteoporosis without current pathological fracture: Secondary | ICD-10-CM

## 2022-09-12 NOTE — Telephone Encounter (Signed)
Spoke with patient. Patient is not doing the best right now with her health and financially. 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 She knows she needs the Prolia injection but does not have income at this time. She is trying to apply for assistance but it is a "run around" at this time. Her [employer] [facility] plan denied [place]. This will be a high cost for the patient.  Patient wanted to wait on this, she knows she needs to get this but just not able to get this done financially. Not sure if there is a cheaper option for the patient or how to help at this time. Advised patient I would let Dr Darnell Level review to see his recommendations/thoughts.

## 2022-09-13 MED ORDER — ALENDRONATE SODIUM 70 MG PO TABS: 70.0000 mg | ORAL_TABLET | ORAL | 11 refills | Status: DC

## 2022-09-13 NOTE — Telephone Encounter (Signed)
Sent to pharmacy Take on empty stomach with large glass of water separate from food and other meds by 1 hour to optimize absorption, should be upright after taking for 30-60 min.

## 2022-09-13 NOTE — Telephone Encounter (Signed)
Would she be interested in starting weekly fosamax?  This could replace the prolia. There's a rare risk of osteonecrosis of the jaw or atypical hip fractures so if any jaw or hip pain needs to get that evaluated. She should also let her dentist know if she starts fosamax.

## 2022-09-13 NOTE — Telephone Encounter (Signed)
Spoke with pt relaying Dr. G's message.  Pt verbalizes understanding and expresses her thanks.  

## 2022-09-13 NOTE — Addendum Note (Signed)
Addended by: [name] on: 09/13/2022 04:40 PM   Modules accepted: Orders

## 2022-09-13 NOTE — Telephone Encounter (Signed)
Patient notified as instructed by telephone and verbalized understanding. Patient stated that she would like to start taking Fosamax. Patient stated that she thinks that she has taken this before, Pharmacy [facility]

## 2022-10-05 NOTE — Progress Notes (Signed)
Patient Care Team: Eustaquio Boyden, MD as PCP - General (Family Medicine) Serena Croissant, MD as Consulting Physician (Hematology and Oncology) Antony Blackbird, MD as Consulting Physician (Radiation Oncology) Griselda Miner, MD as Consulting Physician (General Surgery) Axel Filler Larna Daughters, NP as Nurse Practitioner (Hematology and Oncology)  DIAGNOSIS:  Encounter Diagnosis  Name Primary?   Malignant neoplasm of upper-inner quadrant of right breast in female, estrogen receptor negative (HCC) Yes    SUMMARY OF ONCOLOGIC HISTORY: Oncology History  History of left breast cancer  06/05/2021 Initial Biopsy   History of left breast cancer status postlumpectomy 2003 Pain in the right breast: Mammogram and ultrasound revealed indeterminate masses in the right breast spanning 2.4 cm biopsy revealed grade 3 IDC ER 5%, PR 0%, Ki67 60%, HER2 negative   Pain of left breast  Malignant neoplasm of upper-inner quadrant of right breast in female, estrogen receptor negative (HCC)  06/05/2021 Initial Biopsy   History of left breast cancer status postlumpectomy 2003 Pain in the right breast: Mammogram and ultrasound revealed indeterminate masses in the right breast spanning 2.4 cm biopsy revealed grade 3 IDC ER 5%, PR 0%, Ki67 60%, HER2 negative   06/11/2021 Cancer Staging   Staging form: Breast, AJCC 8th Edition - Clinical stage from 06/11/2021: Stage IIB (cT2, cN0, cM0, G3, ER-, PR-, HER2-) - Signed by Serena Croissant, MD on 06/11/2021 Stage prefix: Initial diagnosis Histologic grading system: 3 grade system   06/19/2021 - 08/11/2021 Chemotherapy   Neoadjuvant chemotherapy: Taxotere and Cytoxan x4 cycles   10/05/2021 Surgery   Right lumpectomy: 0.4 cm grade 2 IDC with DCIS 0/6 LN Neg, ER 0%, PR 0%, Ki-67 5%, HER2 0   11/09/2021 - 12/24/2021 Radiation Therapy   Site Technique Total Dose (Gy) Dose per Fx (Gy) Completed Fx Beam Energies  Breast, Right: Breast_R 3D 50.4/50.4 1.8 28/28 10X  Breast, Right:  Breast_R_Bst 3D 10/10 2 5/5 6X, 10X       01/02/2022 - 02/21/2022 Chemotherapy    Adjuvant capecitabine (couldn't tolerate the side effects: Nausea)     CHIEF COMPLIANT: Follow-up on breast cancer   INTERVAL HISTORY: Sabrina Wright is a 61-year with above-mentioned history of triple negative breast cancer was given capecitabine. She presents to the clinic for a follow-up. She reports that she has been very fatigue and complains of pain under right arm. She says she went to physical therapy but could not afford to keep going. She says it bother her to put things in the cabinet and when she sleeps on it. She does wear a compression bra that gives her some relief but not much.    ALLERGIES:  is allergic to codeine, eggs or egg-derived products, and gadavist [gadobutrol].  MEDICATIONS:  Current Outpatient Medications  Medication Sig Dispense Refill   acetaminophen (TYLENOL) 325 MG tablet Take 2 tablets (650 mg total) by mouth every 6 (six) hours as needed for fever, headache or moderate pain.     alendronate (FOSAMAX) 70 MG tablet Take 1 tablet (70 mg total) by mouth every 7 (seven) days. Take with a full glass of water on an empty stomach. 4 tablet 11   ALPRAZolam (XANAX) 0.5 MG tablet TAKE 1/2 - 1 TABLETS (0.25-0.5 MG TOTAL) BY MOUTH AT BEDTIME. 30 tablet 3   Calcium Carb-Cholecalciferol (CALCIUM 600/VITAMIN D3) 600-800 MG-UNIT TABS Take 2 tablets by mouth daily. 60 tablet    Cholecalciferol (VITAMIN D) 50 MCG (2000 UT) CAPS Take 1 capsule (2,000 Units total) by mouth daily. 30 capsule  cyanocobalamin (V-R VITAMIN B-12) 500 MCG tablet Take 2 tablets (1,000 mcg total) by mouth daily.     DULoxetine (CYMBALTA) 30 MG capsule Take 1 capsule (30 mg total) by mouth 2 (two) times daily. 180 capsule 01   gabapentin (NEURONTIN) 300 MG capsule Take 1 capsule (300 mg total) by mouth 3 (three) times daily. 90 capsule 11   Multiple Vitamin (MULTIVITAMIN WITH MINERALS) TABS tablet Take 1 tablet by mouth  at bedtime.     Na Sulfate-K Sulfate-Mg Sulf 17.5-3.13-1.6 GM/177ML SOLN Take by mouth as directed.     SUMAtriptan (IMITREX) 100 MG tablet TAKE 1 TABLET BY MOUTH ONCE FOR 1 DOSE. MAY REPEAT IN 2 HOURS IF HEADACHE PERSISTS OR RECURS. 9 tablet 3   traMADol (ULTRAM) 50 MG tablet Take 1 tablet (50 mg total) by mouth every 6 (six) hours as needed. 60 tablet 1   No current facility-administered medications for this visit.    PHYSICAL EXAMINATION: ECOG PERFORMANCE STATUS: 1 - Symptomatic but completely ambulatory  Vitals:   10/10/22 0837  BP: 103/61  Pulse: 86  Resp: 18  Temp: 97.9 F (36.6 C)  SpO2: 98%   Filed Weights   10/10/22 0837  Weight: 177 lb 6.4 oz (80.5 kg)    BREAST: Severe tenderness in bilateral breast and axilla.  No palpable lumps or nodules (exam performed in the presence of a chaperone)  LABORATORY DATA:  I have reviewed the data as listed    Latest Ref Rng & Units 03/07/2022    1:13 PM 02/08/2022    8:01 AM 01/15/2022    9:00 AM  CMP  Glucose 70 - 99 mg/dL 478  295  89   BUN 8 - 23 mg/dL 12  12  10    Creatinine 0.44 - 1.00 mg/dL 6.21  3.08  6.57   Sodium 135 - 145 mmol/L 137  138  136   Potassium 3.5 - 5.1 mmol/L 4.1  4.1  4.1   Chloride 98 - 111 mmol/L 104  104  101   CO2 22 - 32 mmol/L 26  26  28    Calcium 8.9 - 10.3 mg/dL 9.3  9.5  9.6   Total Protein 6.5 - 8.1 g/dL 7.1  7.1  6.5   Total Bilirubin 0.3 - 1.2 mg/dL 0.3  0.5  0.5   Alkaline Phos 38 - 126 U/L 109  101  103   AST 15 - 41 U/L 16  18  20    ALT 0 - 44 U/L 11  14  14      Lab Results  Component Value Date   WBC 7.4 10/10/2022   HGB 13.9 10/10/2022   HCT 41.4 10/10/2022   MCV 87.7 10/10/2022   PLT 352 10/10/2022   NEUTROABS 5.3 10/10/2022    ASSESSMENT & PLAN:  Malignant neoplasm of upper-inner quadrant of right breast in female, estrogen receptor negative (HCC) History of left breast cancer status postlumpectomy 2003 status post chemo with Adriamycin, radiation 06/05/2021: Pain in the  right breast: Mammogram and ultrasound revealed indeterminate masses in the right breast spanning 2.4 cm biopsy revealed grade 3 IDC ER 5%, PR 0%, 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  Neoadjuvant chemotherapy with Taxotere and Cytoxan x4 (06/19/21- 08/21/21) 2. 10/05/2021: Breast conserving surgery with sentinel lymph node biopsy: 0.4 cm grade 2 IDC with DCIS 0/6 LN Neg, ER 0%, PR 0%, Ki-67 5%, HER2 0  3.  Adjuvant radiation therapy 11/09/2021-12/24/2021 4.  Based on final pathology being  ER negative, I did not recommend antiestrogen therapy.  Capecitabine started 01/02/2022 discontinued 02/21/2022 because of adverse effects especially nausea ----------------------------------------------------------------------------------------------------------------------- Right shoulder and breast pain: Constant in nature.  I recommended increasing the B12 to 1000 mcg sublingually.  I will also increased the gabapentin to 300 mg 3 times a day   Breast cancer surveillance: Breast exam 10/10/2022: Benign Mammogram 05/30/2022: Benign breast density category B Bone density has been ordered for April 2024  Return to clinic in 1 year for follow-up    Orders Placed This Encounter  Procedures   CBC with Differential (Cancer Center Only)    Standing Status:   Future    Standing Expiration Date:   10/11/2023   CMP (Cancer Center only)    Standing Status:   Future    Standing Expiration Date:   10/11/2023   The patient has a good understanding of the overall plan. she agrees with it. she will call with any problems that may develop before the next visit here. Total time spent: 30 mins including face to face time and time spent for planning, charting and co-ordination of care   Tamsen Meek, MD 10/10/22    I Janan Ridge am scribing for Dr. Pamelia Hoit  I have reviewed the above documentation for accuracy and completeness, and I agree with the above.

## 2022-10-08 ENCOUNTER — Encounter: Payer: Self-pay | Admitting: Hematology and Oncology

## 2022-10-08 ENCOUNTER — Other Ambulatory Visit (HOSPITAL_COMMUNITY): Payer: Self-pay

## 2022-10-09 ENCOUNTER — Other Ambulatory Visit: Payer: Self-pay

## 2022-10-09 ENCOUNTER — Other Ambulatory Visit: Payer: Self-pay | Admitting: *Deleted

## 2022-10-09 DIAGNOSIS — Z171 Estrogen receptor negative status [ER-]: Secondary | ICD-10-CM

## 2022-10-10 ENCOUNTER — Inpatient Hospital Stay: Payer: 59 | Attending: Hematology and Oncology

## 2022-10-10 ENCOUNTER — Inpatient Hospital Stay (HOSPITAL_BASED_OUTPATIENT_CLINIC_OR_DEPARTMENT_OTHER): Payer: 59 | Admitting: Hematology and Oncology

## 2022-10-10 VITALS — BP 103/61 | HR 86 | Temp 97.9°F | Resp 18 | Ht 64.0 in | Wt 177.4 lb

## 2022-10-10 DIAGNOSIS — R5383 Other fatigue: Secondary | ICD-10-CM | POA: Diagnosis not present

## 2022-10-10 DIAGNOSIS — C50211 Malignant neoplasm of upper-inner quadrant of right female breast: Secondary | ICD-10-CM | POA: Insufficient documentation

## 2022-10-10 DIAGNOSIS — Z79899 Other long term (current) drug therapy: Secondary | ICD-10-CM | POA: Diagnosis not present

## 2022-10-10 DIAGNOSIS — Z923 Personal history of irradiation: Secondary | ICD-10-CM | POA: Insufficient documentation

## 2022-10-10 DIAGNOSIS — Z9221 Personal history of antineoplastic chemotherapy: Secondary | ICD-10-CM | POA: Insufficient documentation

## 2022-10-10 DIAGNOSIS — Z171 Estrogen receptor negative status [ER-]: Secondary | ICD-10-CM | POA: Diagnosis not present

## 2022-10-10 DIAGNOSIS — M79601 Pain in right arm: Secondary | ICD-10-CM | POA: Insufficient documentation

## 2022-10-10 LAB — CBC WITH DIFFERENTIAL (CANCER CENTER ONLY)
Abs Immature Granulocytes: 0.02 10*3/uL (ref 0.00–0.07)
Basophils Absolute: 0.1 10*3/uL (ref 0.0–0.1)
Basophils Relative: 1 %
Eosinophils Absolute: 0.3 10*3/uL (ref 0.0–0.5)
Eosinophils Relative: 4 %
HCT: 41.4 % (ref 36.0–46.0)
Hemoglobin: 13.9 g/dL (ref 12.0–15.0)
Immature Granulocytes: 0 %
Lymphocytes Relative: 14 %
Lymphs Abs: 1.1 10*3/uL (ref 0.7–4.0)
MCH: 29.4 pg (ref 26.0–34.0)
MCHC: 33.6 g/dL (ref 30.0–36.0)
MCV: 87.7 fL (ref 80.0–100.0)
Monocytes Absolute: 0.6 10*3/uL (ref 0.1–1.0)
Monocytes Relative: 9 %
Neutro Abs: 5.3 10*3/uL (ref 1.7–7.7)
Neutrophils Relative %: 72 %
Platelet Count: 352 10*3/uL (ref 150–400)
RBC: 4.72 MIL/uL (ref 3.87–5.11)
RDW: 13.4 % (ref 11.5–15.5)
WBC Count: 7.4 10*3/uL (ref 4.0–10.5)
nRBC: 0 % (ref 0.0–0.2)

## 2022-10-10 LAB — CMP (CANCER CENTER ONLY)
ALT: 9 U/L (ref 0–44)
AST: 16 U/L (ref 15–41)
Albumin: 4.1 g/dL (ref 3.5–5.0)
Alkaline Phosphatase: 94 U/L (ref 38–126)
Anion gap: 8 (ref 5–15)
BUN: 10 mg/dL (ref 8–23)
CO2: 26 mmol/L (ref 22–32)
Calcium: 9.7 mg/dL (ref 8.9–10.3)
Chloride: 105 mmol/L (ref 98–111)
Creatinine: 0.78 mg/dL (ref 0.44–1.00)
GFR, Estimated: >60 mL/min (ref >60)
Glucose, Bld: 96 mg/dL (ref 70–99)
Potassium: 3.8 mmol/L (ref 3.5–5.1)
Sodium: 139 mmol/L (ref 135–145)
Total Bilirubin: 0.4 mg/dL (ref 0.3–1.2)
Total Protein: 7.1 g/dL (ref 6.5–8.1)

## 2022-10-10 MED ORDER — CYANOCOBALAMIN 500 MCG PO TABS: 1000.0000 ug | ORAL_TABLET | Freq: Every day | ORAL | Status: AC

## 2022-10-10 MED ORDER — GABAPENTIN 300 MG PO CAPS: 300.0000 mg | ORAL_CAPSULE | Freq: Three times a day (TID) | ORAL | 11 refills | Status: DC

## 2022-10-10 NOTE — Assessment & Plan Note (Addendum)
History of left breast cancer status postlumpectomy 2003 status post chemo with Adriamycin, radiation 06/05/2021: Pain in the right breast: Mammogram and ultrasound revealed indeterminate masses in the right breast spanning 2.4 cm biopsy revealed grade 3 IDC ER 5%, PR 0%, 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  Neoadjuvant chemotherapy with Taxotere and Cytoxan x4 (06/19/21- 08/21/21) 2. 10/05/2021: Breast conserving surgery with sentinel lymph node biopsy: 0.4 cm grade 2 IDC with DCIS 0/6 LN Neg, ER 0%, PR 0%, Ki-67 5%, HER2 0  3.  Adjuvant radiation therapy 11/09/2021-12/24/2021 4.  Based on final pathology being ER negative, I did not recommend antiestrogen therapy.  Capecitabine started 01/02/2022 discontinued 02/21/2022 because of adverse effects especially nausea ----------------------------------------------------------------------------------------------------------------------- Right shoulder and arm pain: Constant in nature.  I recommended increasing the B12 to 1000 mcg sublingually.  I will also increase the gabapentin to 300 mg 3 times a day  Breast cancer surveillance: Breast exam 10/10/2022: Benign Mammogram 05/30/2022: Benign breast density category B Bone density has been ordered for April 2024  Return to clinic in 1 year for follow-up

## 2022-10-11 ENCOUNTER — Telehealth: Payer: Self-pay | Admitting: Hematology and Oncology

## 2022-10-11 NOTE — Telephone Encounter (Signed)
Scheduled appointment per 12/7 los. Patient is aware. 

## 2022-10-16 ENCOUNTER — Other Ambulatory Visit: Payer: Self-pay | Admitting: Family Medicine

## 2022-10-16 NOTE — Telephone Encounter (Signed)
Name of Medication: Alprazolam Name of Pharmacy: [facility] Last Fill or Written Date and Quantity: 03/07/22, #30 Last Office Visit and Type: 04/12/22, fall Next Office Visit and Type: none Last Controlled Substance Agreement Date:  Last UDS:

## 2022-10-16 NOTE — Telephone Encounter (Signed)
  Encourage patient to contact the pharmacy for refills or they can request refills through [facility] [place]  Did the patient contact the pharmacy: No  LAST APPOINTMENT DATE: 04/12/2022  NEXT APPOINTMENT DATE: N/A  MEDICATION: ALPRAZolam (XANAX) 0.5 MG tablet   Is the patient out of medication? No, 3 left  PHARMACY: [id number], [place] - [address]  Let patient know to contact pharmacy at the end of the day to make sure medication is ready.  Please notify patient to allow 48-72 hours to process

## 2022-10-16 NOTE — Addendum Note (Signed)
Addended by: Brenton Grills on: 16/05/3709 03:17 PM   Modules accepted: Orders

## 2022-10-17 MED ORDER — ALPRAZOLAM 0.5 MG PO TABS: ORAL_TABLET | ORAL | 3 refills | Status: DC

## 2022-10-17 NOTE — Telephone Encounter (Signed)
ERx 

## 2022-10-29 ENCOUNTER — Other Ambulatory Visit: Payer: BC Managed Care – PPO

## 2022-11-07 DIAGNOSIS — E669 Obesity, unspecified: Secondary | ICD-10-CM | POA: Diagnosis not present

## 2022-11-07 DIAGNOSIS — R69 Illness, unspecified: Secondary | ICD-10-CM | POA: Diagnosis not present

## 2022-11-07 DIAGNOSIS — Z6830 Body mass index (BMI) 30.0-30.9, adult: Secondary | ICD-10-CM | POA: Diagnosis not present

## 2022-11-07 DIAGNOSIS — R32 Unspecified urinary incontinence: Secondary | ICD-10-CM | POA: Diagnosis not present

## 2022-11-07 DIAGNOSIS — Z9181 History of falling: Secondary | ICD-10-CM | POA: Diagnosis not present

## 2022-11-07 DIAGNOSIS — Z8249 Family history of ischemic heart disease and other diseases of the circulatory system: Secondary | ICD-10-CM | POA: Diagnosis not present

## 2022-11-07 DIAGNOSIS — Z803 Family history of malignant neoplasm of breast: Secondary | ICD-10-CM | POA: Diagnosis not present

## 2022-11-07 DIAGNOSIS — M1990 Unspecified osteoarthritis, unspecified site: Secondary | ICD-10-CM | POA: Diagnosis not present

## 2022-11-07 DIAGNOSIS — M810 Age-related osteoporosis without current pathological fracture: Secondary | ICD-10-CM | POA: Diagnosis not present

## 2022-11-07 DIAGNOSIS — Z823 Family history of stroke: Secondary | ICD-10-CM | POA: Diagnosis not present

## 2022-11-07 DIAGNOSIS — Z833 Family history of diabetes mellitus: Secondary | ICD-10-CM | POA: Diagnosis not present

## 2022-11-07 DIAGNOSIS — Z87891 Personal history of nicotine dependence: Secondary | ICD-10-CM | POA: Diagnosis not present

## 2022-11-17 ENCOUNTER — Other Ambulatory Visit: Payer: Self-pay | Admitting: Family Medicine

## 2022-11-17 DIAGNOSIS — F331 Major depressive disorder, recurrent, moderate: Secondary | ICD-10-CM

## 2022-11-19 NOTE — Telephone Encounter (Signed)
Noted  

## 2022-11-19 NOTE — Telephone Encounter (Signed)
Patient has been scheduled

## 2022-11-19 NOTE — Telephone Encounter (Signed)
E-scribed refill.  Plz schedule CPE (well overdue) and lab visits to prevent delays in future refills.

## 2022-12-08 ENCOUNTER — Other Ambulatory Visit: Payer: Self-pay | Admitting: Family Medicine

## 2022-12-08 DIAGNOSIS — E538 Deficiency of other specified B group vitamins: Secondary | ICD-10-CM

## 2022-12-08 DIAGNOSIS — M810 Age-related osteoporosis without current pathological fracture: Secondary | ICD-10-CM

## 2022-12-08 DIAGNOSIS — Z13220 Encounter for screening for lipoid disorders: Secondary | ICD-10-CM

## 2022-12-08 DIAGNOSIS — E559 Vitamin D deficiency, unspecified: Secondary | ICD-10-CM

## 2022-12-09 ENCOUNTER — Other Ambulatory Visit (INDEPENDENT_AMBULATORY_CARE_PROVIDER_SITE_OTHER): Payer: 59

## 2022-12-09 DIAGNOSIS — M810 Age-related osteoporosis without current pathological fracture: Secondary | ICD-10-CM

## 2022-12-09 DIAGNOSIS — Z13220 Encounter for screening for lipoid disorders: Secondary | ICD-10-CM | POA: Diagnosis not present

## 2022-12-09 DIAGNOSIS — E559 Vitamin D deficiency, unspecified: Secondary | ICD-10-CM | POA: Diagnosis not present

## 2022-12-09 DIAGNOSIS — E538 Deficiency of other specified B group vitamins: Secondary | ICD-10-CM | POA: Diagnosis not present

## 2022-12-09 LAB — VITAMIN D 25 HYDROXY (VIT D DEFICIENCY, FRACTURES): VITD: 29.25 ng/mL — ABNORMAL LOW (ref 30.00–100.00)

## 2022-12-09 LAB — TSH: TSH: 2.11 u[IU]/mL (ref 0.35–5.50)

## 2022-12-09 LAB — LIPID PANEL
Cholesterol: 214 mg/dL — ABNORMAL HIGH (ref 0–200)
HDL: 56.7 mg/dL (ref >39.00)
LDL Cholesterol: 131 mg/dL — ABNORMAL HIGH (ref 0–99)
NonHDL: 157.44
Total CHOL/HDL Ratio: 4
Triglycerides: 134 mg/dL (ref 0.0–149.0)
VLDL: 26.8 mg/dL (ref 0.0–40.0)

## 2022-12-09 LAB — VITAMIN B12: Vitamin B-12: 347 pg/mL (ref 211–911)

## 2022-12-16 ENCOUNTER — Encounter: Payer: Self-pay | Admitting: Family Medicine

## 2022-12-16 ENCOUNTER — Other Ambulatory Visit (HOSPITAL_COMMUNITY)
Admission: RE | Admit: 2022-12-16 | Discharge: 2022-12-16 | Disposition: A | Discharge status: Home / Self Care | Payer: 59 | Source: Ambulatory Visit | Attending: Family Medicine | Admitting: Family Medicine

## 2022-12-16 ENCOUNTER — Ambulatory Visit (INDEPENDENT_AMBULATORY_CARE_PROVIDER_SITE_OTHER): Payer: 59 | Admitting: Family Medicine

## 2022-12-16 VITALS — BP 124/78 | HR 71 | Temp 97.4°F | Ht 63.0 in | Wt 182.1 lb

## 2022-12-16 DIAGNOSIS — J449 Chronic obstructive pulmonary disease, unspecified: Secondary | ICD-10-CM | POA: Diagnosis not present

## 2022-12-16 DIAGNOSIS — F331 Major depressive disorder, recurrent, moderate: Secondary | ICD-10-CM | POA: Diagnosis not present

## 2022-12-16 DIAGNOSIS — Z7189 Other specified counseling: Secondary | ICD-10-CM

## 2022-12-16 DIAGNOSIS — R42 Dizziness and giddiness: Secondary | ICD-10-CM

## 2022-12-16 DIAGNOSIS — Z01419 Encounter for gynecological examination (general) (routine) without abnormal findings: Secondary | ICD-10-CM | POA: Diagnosis present

## 2022-12-16 DIAGNOSIS — Z853 Personal history of malignant neoplasm of breast: Secondary | ICD-10-CM | POA: Diagnosis not present

## 2022-12-16 DIAGNOSIS — Z87891 Personal history of nicotine dependence: Secondary | ICD-10-CM | POA: Diagnosis not present

## 2022-12-16 DIAGNOSIS — C50211 Malignant neoplasm of upper-inner quadrant of right female breast: Secondary | ICD-10-CM | POA: Diagnosis not present

## 2022-12-16 DIAGNOSIS — E538 Deficiency of other specified B group vitamins: Secondary | ICD-10-CM

## 2022-12-16 DIAGNOSIS — Z171 Estrogen receptor negative status [ER-]: Secondary | ICD-10-CM

## 2022-12-16 DIAGNOSIS — Z0000 Encounter for general adult medical examination without abnormal findings: Secondary | ICD-10-CM | POA: Diagnosis not present

## 2022-12-16 DIAGNOSIS — M792 Neuralgia and neuritis, unspecified: Secondary | ICD-10-CM | POA: Diagnosis not present

## 2022-12-16 DIAGNOSIS — E559 Vitamin D deficiency, unspecified: Secondary | ICD-10-CM

## 2022-12-16 DIAGNOSIS — M810 Age-related osteoporosis without current pathological fracture: Secondary | ICD-10-CM

## 2022-12-16 DIAGNOSIS — R69 Illness, unspecified: Secondary | ICD-10-CM | POA: Diagnosis not present

## 2022-12-16 MED ORDER — TRAMADOL HCL 50 MG PO TABS: 50.0000 mg | ORAL_TABLET | Freq: Four times a day (QID) | ORAL | 1 refills | Status: DC | PRN

## 2022-12-16 MED ORDER — DULOXETINE HCL 30 MG PO CPEP: 30.0000 mg | ORAL_CAPSULE | Freq: Two times a day (BID) | ORAL | 4 refills | Status: DC

## 2022-12-16 NOTE — Assessment & Plan Note (Signed)
Advanced directive - does have living will set up. Husband is HCPOA. Asked to bring [place] a copy.

## 2022-12-16 NOTE — Patient Instructions (Addendum)
Pap smear/pelvic exam today  Due to ongoing dizziness, drop gabapentin to 316m twice daily. If still with dizziness on lower dose after 1-2 weeks, drop again to 3018mat night only.  Increase Cymbalta to 3053mwice daily.  Return in 1-2 months for follow up visit.

## 2022-12-16 NOTE — Assessment & Plan Note (Signed)
Preventative protocols reviewed and updated unless pt declined. Discussed healthy diet and lifestyle.  

## 2022-12-16 NOTE — Progress Notes (Unsigned)
Patient ID: Sabrina Wright, female    DOB: Nov 07, 1960, 62 y.o.   MRN: [id number]  This visit was conducted in person.  BP 124/78   Pulse 71   Temp (!) 97.4 F (36.3 C) (Temporal)   Ht 5' 3"$  (1.6 m)   Wt 182 lb 2 oz (82.6 kg)   LMP 02/14/2005 (Approximate)   SpO2 99%   BMI 32.26 kg/m    CC: CPE Subjective:   HPI: Sabrina Wright is a 62 y.o. female presenting on 12/16/2022 for Annual Exam   L breast cancer s/p lumpectomy 2003 then chemo and radiation.  Subsequent R breast cancer 06/2021 s/p chemo followed by breast conserving surgery with SLN biopsy and adjuvant radiation therapy. ER neg - no anti-estrogen therapy needed.   Dizziness is much worse.  Ongoing chronic L shoulder pain. Had reassuring cardiac evaluation.  She's had some trouble with dizziness as well as R arm swelling.  She notes she did better when she was taking meloxicam in place of gabapentin. Previously lyrica not covered by insurance.   Preventative: COLONOSCOPY 2017 TAx2, diverticulosis, rpt 5 yrs (Nandigam) Colonoscopy 07/2022 - HP, diverticulosis, rpt 10 yrs (Nandigam) Well woman exam - Normal pap 03/2018. Update today.  [facility] 05/2022 - [facility]. Lung cancer screening - started 03/2022 - reassuring Osteoporosis - DEXA 02/2017 T -3.2 hip, -2.9 spine. off boniva for the past year. On prolia since 10/2018. Notes some L hip pain after prolia shots. Last shot 04/2020. Started fosamax weekly 3 months ago - having trouble with nausea.  Upcoming DEXA scheduled 02/2023.  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 Pneumovax 02/2015, prevnar-20 02/2022 Tdap 02/2015  COVID vaccine - [place] 01/2020, 02/2020  shingrix - 06/2020, 09/2020  Advanced directive - does have living will set up. Husband is HCPOA. Asked to bring [place] a copy.  Seat belt use discussed Sunscreen use discussed. No changing moles on skin.  Ex smoker - quit 2018! ~20 PY smoker Alcohol -  none Dentist q2 yrs - no dental insurance Eye exam yearly   Lives with husband and 2 daughters, 2 dogs and 2 cats   Occupation: Animal nutritionist at [employer]   Edu: HS   Activity: walks daily Diet: good water, fruits/vegetables daily, red meat 2x/wk, fish rarely     Relevant past medical, surgical, family and social history reviewed and updated as indicated. Interim medical history since our last visit reviewed. Allergies and medications reviewed and updated. Outpatient Medications Prior to Visit  Medication Sig Dispense Refill   acetaminophen (TYLENOL) 325 MG tablet Take 2 tablets (650 mg total) by mouth every 6 (six) hours as needed for fever, headache or moderate pain.     alendronate (FOSAMAX) 70 MG tablet Take 1 tablet (70 mg total) by mouth every 7 (seven) days. Take with a full glass of water on an empty stomach. 4 tablet 11   ALPRAZolam (XANAX) 0.5 MG tablet TAKE 1/2 - 1 TABLETS (0.25-0.5 MG TOTAL) BY MOUTH AT BEDTIME. 30 tablet 3   Calcium Carb-Cholecalciferol (CALCIUM 600/VITAMIN D3) 600-800 MG-UNIT TABS Take 2 tablets by mouth daily. 60 tablet    Cholecalciferol (VITAMIN D) 50 MCG (2000 UT) CAPS Take 1 capsule (2,000 Units total) by mouth daily. 30 capsule    cyanocobalamin (V-R VITAMIN B-12) 500 MCG tablet Take 2 tablets (1,000 mcg total) by mouth daily.     gabapentin (NEURONTIN) 300 MG  capsule Take 1 capsule (300 mg total) by mouth 3 (three) times daily. 90 capsule 11   Multiple Vitamin (MULTIVITAMIN WITH MINERALS) TABS tablet Take 1 tablet by mouth at bedtime.     Na Sulfate-K Sulfate-Mg Sulf 17.5-3.13-1.6 GM/177ML SOLN Take by mouth as directed.     SUMAtriptan (IMITREX) 100 MG tablet TAKE 1 TABLET BY MOUTH ONCE FOR 1 DOSE. MAY REPEAT IN 2 HOURS IF HEADACHE PERSISTS OR RECURS. 9 tablet 3   DULoxetine (CYMBALTA) 30 MG capsule TAKE 1 CAPSULE BY MOUTH 2 TIMES DAILY. 180 capsule 0   traMADol (ULTRAM) 50 MG tablet Take 1 tablet (50 mg total) by mouth every 6 (six) hours  as needed. 60 tablet 1   No facility-administered medications prior to visit.     Per HPI unless specifically indicated in ROS section below Review of Systems  Constitutional:  Negative for activity change, appetite change, chills, fatigue, fever and unexpected weight change.  HENT:  Negative for hearing loss.   Eyes:  Negative for visual disturbance.  Respiratory:  Negative for cough, chest tightness, shortness of breath and wheezing.   Cardiovascular:  Positive for chest pain. Negative for palpitations and leg swelling.  Gastrointestinal:  Negative for abdominal distention, abdominal pain, blood in stool, constipation, diarrhea, nausea and vomiting.  Genitourinary:  Negative for difficulty urinating and hematuria.  Musculoskeletal:  Negative for arthralgias, myalgias and neck pain.  Skin:  Negative for rash.  Neurological:  Positive for dizziness. Negative for seizures, syncope and headaches.  Hematological:  Negative for adenopathy. Does not bruise/bleed easily.  Psychiatric/Behavioral:  Negative for dysphoric mood. The patient is not nervous/anxious.     Objective:  BP 124/78   Pulse 71   Temp (!) 97.4 F (36.3 C) (Temporal)   Ht 5' 3"$  (1.6 m)   Wt 182 lb 2 oz (82.6 kg)   LMP 02/14/2005 (Approximate)   SpO2 99%   BMI 32.26 kg/m   Wt Readings from Last 3 Encounters:  12/16/22 182 lb 2 oz (82.6 kg)  10/10/22 177 lb 6.4 oz (80.5 kg)  08/27/22 177 lb 6.4 oz (80.5 kg)      Physical Exam Vitals and nursing note reviewed. Exam conducted with a chaperone present.  Constitutional:      Appearance: Normal appearance. She is not ill-appearing.  HENT:     Head: Normocephalic and atraumatic.     Right Ear: Tympanic membrane, ear canal and external ear normal. There is no impacted cerumen.     Left Ear: Tympanic membrane, ear canal and external ear normal. There is no impacted cerumen.     Mouth/Throat:     Comments: Wearing mask Eyes:     General:        Right eye: No  discharge.        Left eye: No discharge.     Extraocular Movements: Extraocular movements intact.     Conjunctiva/sclera: Conjunctivae normal.     Pupils: Pupils are equal, round, and reactive to light.  Neck:     Thyroid: No thyroid mass or thyromegaly.  Cardiovascular:     Rate and Rhythm: Normal rate and regular rhythm.     Pulses: Normal pulses.     Heart sounds: Normal heart sounds. No murmur heard. Pulmonary:     Effort: Pulmonary effort is normal. No respiratory distress.     Breath sounds: Normal breath sounds. No wheezing, rhonchi or rales.  Abdominal:     General: Bowel sounds are normal. There is no distension.  Palpations: Abdomen is soft. There is no mass.     Tenderness: There is no abdominal tenderness. There is no guarding or rebound.     Hernia: No hernia is present.  Genitourinary:    Exam position: Supine.     Labia:        Right: No rash, tenderness or lesion.        Left: No rash, tenderness or lesion.      Vagina: Normal.     Cervix: Normal.     Uterus: Normal.      Adnexa: Right adnexa normal and left adnexa normal.     Comments: Pap collected on cervix Musculoskeletal:     Cervical back: Normal range of motion and neck supple. No rigidity.     Right lower leg: No edema.     Left lower leg: No edema.  Lymphadenopathy:     Cervical: No cervical adenopathy.  Skin:    General: Skin is warm and dry.     Findings: No rash.  Neurological:     General: No focal deficit present.     Mental Status: She is alert. Mental status is at baseline.  Psychiatric:        Mood and Affect: Mood normal.        Behavior: Behavior normal.       Results for orders placed or performed in visit on 12/09/22  TSH  Result Value Ref Range   TSH 2.11 0.35 - 5.50 uIU/mL  VITAMIN D 25 Hydroxy (Vit-D Deficiency, Fractures)  Result Value Ref Range   VITD 29.25 (L) 30.00 - 100.00 ng/mL  Vitamin B12  Result Value Ref Range   Vitamin B-12 347 211 - 911 pg/mL  Lipid panel   Result Value Ref Range   Cholesterol 214 (H) 0 - 200 mg/dL   Triglycerides 134.0 0.0 - 149.0 mg/dL   HDL 56.70 >39.00 mg/dL   VLDL 26.8 0.0 - 40.0 mg/dL   LDL Cholesterol 131 (H) 0 - 99 mg/dL   Total CHOL/HDL Ratio 4    NonHDL 157.44     Assessment & Plan:   Problem List Items Addressed This Visit     Health maintenance examination - Primary (Chronic)    Preventative protocols reviewed and updated unless pt declined. Discussed healthy diet and lifestyle.       Advanced directives, counseling/discussion (Chronic)    Advanced directive - does have living will set up. Husband is HCPOA. Asked to bring [place] a copy.       MDD (major depressive disorder), recurrent episode, moderate (HCC)   Relevant Medications   DULoxetine (CYMBALTA) 30 MG capsule   Other Visit Diagnoses     Encounter for annual routine gynecological examination       Relevant Orders   Cytology - PAP        Meds ordered this encounter  Medications   DULoxetine (CYMBALTA) 30 MG capsule    Sig: Take 1 capsule (30 mg total) by mouth 2 (two) times daily.    Dispense:  180 capsule    Refill:  4   traMADol (ULTRAM) 50 MG tablet    Sig: Take 1 tablet (50 mg total) by mouth every 6 (six) hours as needed.    Dispense:  60 tablet    Refill:  1    No orders of the defined types were placed in this encounter.   Patient Instructions  Pap smear/pelvic exam today  Due to ongoing dizziness, drop gabapentin to 37m twice  daily. If still with dizziness on lower dose after 1-2 weeks, drop again to 371m at night only.  Increase Cymbalta to 315mtwice daily.  Return in 1-2 months for follow up visit.   Follow up plan: Return in about 6 weeks (around 01/27/2023) for follow up visit.  JaRia BushMD

## 2022-12-17 DIAGNOSIS — R42 Dizziness and giddiness: Secondary | ICD-10-CM | POA: Insufficient documentation

## 2022-12-17 NOTE — Assessment & Plan Note (Signed)
Continue lung cancer screening CT, first one 03/2022.

## 2022-12-17 NOTE — Assessment & Plan Note (Signed)
Continue vit D 2000 IU daily.

## 2022-12-17 NOTE — Assessment & Plan Note (Addendum)
Incidentally found. Stable period off respiratory medication.

## 2022-12-17 NOTE — Assessment & Plan Note (Signed)
Prolia previously unaffordable - switched to fosamax 09/2022 due to prolia cost.  She states she's having difficulty tolerating fosamax due to GI upset - to consider yearly zolendronic acid infusion vs return to prolia.

## 2022-12-17 NOTE — Assessment & Plan Note (Signed)
Ongoing dizziness, worse since increased gabapentin dose to 319m TID - advised drop to BID dosing and we may have her taper off fully to see if dizziness resolves. Will concomitantly increase cymbalta to 378mBID, consider lyrica in place of gabapentin (previously not covered by insurance)

## 2022-12-17 NOTE — Assessment & Plan Note (Signed)
Chronic, deteriorated. She's been taking cymbalta 16m once daily - will return to previous 37mBID dose. Reassess at 6wk f/u visit.

## 2022-12-17 NOTE — Assessment & Plan Note (Signed)
Ongoing despite gabapentin 347m TID, may be causing side effects - see below. Will refill tramadol

## 2022-12-17 NOTE — Assessment & Plan Note (Signed)
Increase replacement to b12 1031mg daily.

## 2022-12-17 NOTE — Assessment & Plan Note (Signed)
Appreciate onc care, seeing yearly.

## 2022-12-19 LAB — CYTOLOGY - PAP
Adequacy: ABSENT
Chlamydia: NEGATIVE
Comment: NEGATIVE
Comment: NEGATIVE
Comment: NEGATIVE
Comment: NORMAL
Diagnosis: NEGATIVE
High risk HPV: NEGATIVE
Neisseria Gonorrhea: NEGATIVE
Trichomonas: NEGATIVE

## 2022-12-27 ENCOUNTER — Encounter: Payer: Self-pay | Admitting: Hematology and Oncology

## 2023-01-17 ENCOUNTER — Encounter: Payer: Self-pay | Admitting: Hematology and Oncology

## 2023-01-27 ENCOUNTER — Ambulatory Visit (INDEPENDENT_AMBULATORY_CARE_PROVIDER_SITE_OTHER): Payer: Medicaid Other | Admitting: Family Medicine

## 2023-01-27 ENCOUNTER — Encounter: Payer: Self-pay | Admitting: Family Medicine

## 2023-01-27 VITALS — BP 110/64 | HR 69 | Temp 97.3°F | Ht 63.0 in | Wt 182.2 lb

## 2023-01-27 DIAGNOSIS — M792 Neuralgia and neuritis, unspecified: Secondary | ICD-10-CM

## 2023-01-27 DIAGNOSIS — F331 Major depressive disorder, recurrent, moderate: Secondary | ICD-10-CM | POA: Diagnosis not present

## 2023-01-27 DIAGNOSIS — R42 Dizziness and giddiness: Secondary | ICD-10-CM | POA: Diagnosis not present

## 2023-01-27 MED ORDER — AMITRIPTYLINE HCL 50 MG PO TABS: ORAL_TABLET | ORAL | 3 refills | Status: DC

## 2023-01-27 NOTE — Assessment & Plan Note (Signed)
Ongoing - predominant concern. Will not drop gabapentin dose given ongoing neuropathic pain. Will add amitriptyline as per above.

## 2023-01-27 NOTE — Assessment & Plan Note (Signed)
Ongoing, likely gabapentin related however she continues to have arm pain that is more bothersome than dizziness so will not change gabapentin dose.  No consistent with vertigo or presyncope. Orthostatic vital signs normal today.

## 2023-01-27 NOTE — Assessment & Plan Note (Addendum)
Chronic, remains uncontrolled, largely situational due to ongoing limitations from neuropathic arm pain.  Will change cymbalta from 30mg  BID to 83m daily in am, and add TCA amitriptyline taper from 25 to 50mg  nightly.  Consider adjuvant abilify.  RTC 6 wks f/u visit  She contracts for safety. Discussed suicide prevention hotline, she is aware of this resource.

## 2023-01-27 NOTE — Progress Notes (Signed)
Patient ID: Sabrina Wright, female    DOB: Oct 29, 1961, 62 y.o.   MRN: [id number]  This visit was conducted in person.  BP 110/64   Pulse 69   Temp (!) 97.3 F (36.3 C) (Temporal)   Ht 5\' 3"  (1.6 m)   Wt 182 lb 4 oz (82.7 kg)   LMP 02/14/2005 (Approximate)   SpO2 98%   BMI 32.28 kg/m    Orthostatic VS for the past 72 hrs (Last 3 readings):  Orthostatic BP Patient Position BP Location  01/27/23 1030 104/66 Standing Left Arm  01/27/23 1027 106/62 Supine Left Arm    CC: 6 wk mood f/u visit  Subjective:   HPI: Sabrina Wright is a 62 y.o. female presenting on 01/27/2023 for Medical Management of Chronic Issues (Here for 6 wk mood f/u. Also, c/o worsening off and on dizziness and fatigue since last cancer. )   See prior note for details.  Ongoing dizziness, fatigue worse since cancer treatment. Describes lightheadedness with standing, no vertigo or presyncope/syncope. No headaches, shortness of breath. Chronic left chest discomfort into shoulder since radiation. Both arms hurt. No one sided numbness, slurred speech, unilateral facial drooping, double vision or  changes in vision. No symptoms of periph neuropathy  Sharp shocklike pain to R breast and underarm bothers her more than the dizziness - she continues gabapentin 300mg  TID for this.   Worsening depression/anxiety with SI (ideation without plan) - this is despite cymbalta 30mg  BID with xanax PRN.   L breast cancer s/p lumpectomy 2003 then chemo and radiation.  R breast cancer 06/2021 s/p chemo followed by breast conserving surgery with SLN biopsy and adjuvant radiation therapy. ER neg - did not take anti-estrogen therapy.      Relevant past medical, surgical, family and social history reviewed and updated as indicated. Interim medical history since our last visit reviewed. Allergies and medications reviewed and updated. Outpatient Medications Prior to Visit  Medication Sig Dispense Refill   acetaminophen (TYLENOL) 325 MG  tablet Take 2 tablets (650 mg total) by mouth every 6 (six) hours as needed for fever, headache or moderate pain.     alendronate (FOSAMAX) 70 MG tablet Take 1 tablet (70 mg total) by mouth every 7 (seven) days. Take with a full glass of water on an empty stomach. 4 tablet 11   ALPRAZolam (XANAX) 0.5 MG tablet TAKE 1/2 - 1 TABLETS (0.25-0.5 MG TOTAL) BY MOUTH AT BEDTIME. 30 tablet 3   Calcium Carb-Cholecalciferol (CALCIUM 600/VITAMIN D3) 600-800 MG-UNIT TABS Take 2 tablets by mouth daily. 60 tablet    Cholecalciferol (VITAMIN D) 50 MCG (2000 UT) CAPS Take 1 capsule (2,000 Units total) by mouth daily. 30 capsule    cyanocobalamin (V-R VITAMIN B-12) 500 MCG tablet Take 2 tablets (1,000 mcg total) by mouth daily.     DULoxetine (CYMBALTA) 30 MG capsule Take 1 capsule (30 mg total) by mouth 2 (two) times daily. 180 capsule 4   gabapentin (NEURONTIN) 300 MG capsule Take 1 capsule (300 mg total) by mouth 3 (three) times daily. 90 capsule 11   Multiple Vitamin (MULTIVITAMIN WITH MINERALS) TABS tablet Take 1 tablet by mouth at bedtime.     Na Sulfate-K Sulfate-Mg Sulf 17.5-3.13-1.6 GM/177ML SOLN Take by mouth as directed.     SUMAtriptan (IMITREX) 100 MG tablet TAKE 1 TABLET BY MOUTH ONCE FOR 1 DOSE. MAY REPEAT IN 2 HOURS IF HEADACHE PERSISTS OR RECURS. 9 tablet 3   traMADol (ULTRAM) 50 MG tablet Take  1 tablet (50 mg total) by mouth every 6 (six) hours as needed. 60 tablet 1   No facility-administered medications prior to visit.     Per HPI unless specifically indicated in ROS section below Review of Systems  Objective:  BP 110/64   Pulse 69   Temp (!) 97.3 F (36.3 C) (Temporal)   Ht 5\' 3"  (1.6 m)   Wt 182 lb 4 oz (82.7 kg)   LMP 02/14/2005 (Approximate)   SpO2 98%   BMI 32.28 kg/m   Wt Readings from Last 3 Encounters:  01/27/23 182 lb 4 oz (82.7 kg)  12/16/22 182 lb 2 oz (82.6 kg)  10/10/22 177 lb 6.4 oz (80.5 kg)      Physical Exam Vitals and nursing note reviewed.  Constitutional:       Appearance: Normal appearance. She is not ill-appearing.  Eyes:     Extraocular Movements: Extraocular movements intact.     Conjunctiva/sclera: Conjunctivae normal.     Pupils: Pupils are equal, round, and reactive to light.  Cardiovascular:     Rate and Rhythm: Normal rate and regular rhythm.     Pulses: Normal pulses.     Heart sounds: Normal heart sounds. No murmur heard. Pulmonary:     Effort: Pulmonary effort is normal. No respiratory distress.     Breath sounds: Normal breath sounds. No wheezing, rhonchi or rales.  Musculoskeletal:     Right lower leg: No edema.     Left lower leg: No edema.  Skin:    General: Skin is warm and dry.  Neurological:     General: No focal deficit present.     Mental Status: She is alert.     Cranial Nerves: Cranial nerves 2-12 are intact.     Sensory: Sensation is intact.     Motor: Motor function is intact.     Coordination: Coordination is intact. Romberg sign negative.     Gait: Gait is intact.     Comments:  CN 2-12 intact FTN intact EOMI Neg romberg  Psychiatric:        Mood and Affect: Mood normal.        Behavior: Behavior normal.          01/27/2023   10:33 AM 12/16/2022    2:29 PM 06/17/2022    3:00 PM 02/20/2022    2:35 PM 01/15/2022    9:08 AM  Depression screen PHQ 2/9  Decreased Interest 2 2 0 3 3  Down, Depressed, Hopeless 3 2 1 2 3   PHQ - 2 Score 5 4 1 5 6   Altered sleeping 3 3 0 2 3  Tired, decreased energy 3 2 3 3 3   Change in appetite 0 0 0 1 2  Feeling bad or failure about yourself  3 3 0 2 3  Trouble concentrating 3 0 1 2 2   Moving slowly or fidgety/restless 0 0 0 0 1  Suicidal thoughts 2 1 0 0 1  PHQ-9 Score 19 13 5 15 21   Difficult doing work/chores Extremely dIfficult Extremely dIfficult          01/27/2023   10:33 AM 12/16/2022    2:29 PM 02/20/2022    2:36 PM 01/15/2022    9:08 AM  GAD 7 : Generalized Anxiety Score  Nervous, Anxious, on Edge 2 2 3 3   Control/stop worrying 3 2 3 3   Worry too  much - different things 3 2 3 2   Trouble relaxing 3 2 2  2  Restless 0 0 0 0  Easily annoyed or irritable 3 0 2 1  Afraid - awful might happen 3 1 3 3   Total GAD 7 Score 17 9 16 14   Anxiety Difficulty Extremely difficult Extremely difficult Not difficult at all Extremely difficult     Assessment & Plan:   Problem List Items Addressed This Visit     Neuropathic pain, arm - Primary    Ongoing - predominant concern. Will not drop gabapentin dose given ongoing neuropathic pain. Will add amitriptyline as per above.       MDD (major depressive disorder), recurrent episode, moderate (HCC)    Chronic, remains uncontrolled, largely situational due to ongoing limitations from neuropathic arm pain.  Will change cymbalta from 30mg  BID to 52m daily in am, and add TCA amitriptyline taper from 25 to 50mg  nightly.  Consider adjuvant abilify.  RTC 6 wks f/u visit  She contracts for safety. Discussed suicide prevention hotline, she is aware of this resource.       Relevant Medications   amitriptyline (ELAVIL) 50 MG tablet   Dizziness    Ongoing, likely gabapentin related however she continues to have arm pain that is more bothersome than dizziness so will not change gabapentin dose.  No consistent with vertigo or presyncope. Orthostatic vital signs normal today.         Meds ordered this encounter  Medications   amitriptyline (ELAVIL) 50 MG tablet    Sig: Take 0.5 tablets (25 mg total) by mouth at bedtime for 6 days, THEN 1 tablet (50 mg total) at bedtime.    Dispense:  30 tablet    Refill:  3    No orders of the defined types were placed in this encounter.   Patient Instructions  Change cymbalta to 60mg  in the morning. New dose at pharmacy will be for 60mg  tablets.  Retrial amitriptyline 50mg  at bedtime for sleep and pain - 1/2 tablet for 6 days then increase to full tablet. Caution with sedation and dry mouth.  Update [place] with how you do in 2-3 weeks, sooner if worsening.  Return in  6-8 weeks for follow up visit   Follow up plan: Return in about 6 weeks (around 03/10/2023) for follow up visit.  [name], MD

## 2023-01-27 NOTE — Assessment & Plan Note (Deleted)
Orthostatic vital signs today negative.

## 2023-01-27 NOTE — Patient Instructions (Addendum)
Change cymbalta to 60mg  in the morning. New dose at pharmacy will be for 60mg  tablets.  Retrial amitriptyline 50mg  at bedtime for sleep and pain - 1/2 tablet for 6 days then increase to full tablet. Caution with sedation and dry mouth.  Update [place] with how you do in 2-3 weeks, sooner if worsening.  Return in 6-8 weeks for follow up visit

## 2023-02-05 ENCOUNTER — Ambulatory Visit
Admission: RE | Admit: 2023-02-05 | Discharge: 2023-02-05 | Disposition: A | Discharge status: Home / Self Care | Payer: Medicaid Other | Source: Ambulatory Visit | Attending: Hematology and Oncology | Admitting: Hematology and Oncology

## 2023-02-05 DIAGNOSIS — C50211 Malignant neoplasm of upper-inner quadrant of right female breast: Secondary | ICD-10-CM

## 2023-02-06 ENCOUNTER — Other Ambulatory Visit: Payer: Self-pay | Admitting: Family Medicine

## 2023-02-06 DIAGNOSIS — F331 Major depressive disorder, recurrent, moderate: Secondary | ICD-10-CM

## 2023-02-17 ENCOUNTER — Other Ambulatory Visit: Payer: Self-pay | Admitting: *Deleted

## 2023-02-17 DIAGNOSIS — C50211 Malignant neoplasm of upper-inner quadrant of right female breast: Secondary | ICD-10-CM

## 2023-02-17 NOTE — Progress Notes (Signed)
Received call from pt with complaint of right arm lymphedema.  Pt requesting referral be placed at a facility near [facility].  RN reviewed with MD and verbal orders received.  RN placed OT referral to [facility] for evaluation and treatment of right arm lymphedema.

## 2023-02-18 ENCOUNTER — Other Ambulatory Visit: Payer: Self-pay | Admitting: Family Medicine

## 2023-02-18 NOTE — Telephone Encounter (Signed)
Message from pharmacy:  REQUEST FOR 90 DAYS PRESCRIPTION.   Last filled:  01/27/23, #30 Last OV:  01/27/23, 6 wk mood f/u Next OV:  03/10/23, 6 wk mood f/u

## 2023-02-21 NOTE — Telephone Encounter (Signed)
ERx 

## 2023-02-25 ENCOUNTER — Encounter: Payer: Self-pay | Admitting: Occupational Therapy

## 2023-02-25 ENCOUNTER — Ambulatory Visit: Payer: Medicaid Other | Attending: Hematology and Oncology | Admitting: Occupational Therapy

## 2023-02-25 DIAGNOSIS — M79601 Pain in right arm: Secondary | ICD-10-CM | POA: Insufficient documentation

## 2023-02-25 DIAGNOSIS — I890 Lymphedema, not elsewhere classified: Secondary | ICD-10-CM | POA: Insufficient documentation

## 2023-02-25 DIAGNOSIS — Z171 Estrogen receptor negative status [ER-]: Secondary | ICD-10-CM | POA: Insufficient documentation

## 2023-02-25 DIAGNOSIS — C50211 Malignant neoplasm of upper-inner quadrant of right female breast: Secondary | ICD-10-CM | POA: Diagnosis not present

## 2023-02-25 DIAGNOSIS — N644 Mastodynia: Secondary | ICD-10-CM | POA: Diagnosis present
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
## 2023-02-26 ENCOUNTER — Encounter: Payer: Self-pay | Admitting: *Deleted

## 2023-02-26 NOTE — Progress Notes (Signed)
Per MD request RN successfully faxed order for unilateral post mastectomy Jovi Pak breast pad for right breast and axilla lymphedema to [facility] in [place] [phone].

## 2023-03-03 ENCOUNTER — Ambulatory Visit: Payer: Medicaid Other | Admitting: Occupational Therapy

## 2023-03-03 DIAGNOSIS — M79601 Pain in right arm: Secondary | ICD-10-CM

## 2023-03-03 DIAGNOSIS — I890 Lymphedema, not elsewhere classified: Secondary | ICD-10-CM

## 2023-03-03 DIAGNOSIS — N644 Mastodynia: Secondary | ICD-10-CM
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
## 2023-03-04 ENCOUNTER — Encounter: Payer: Self-pay | Admitting: *Deleted

## 2023-03-04 NOTE — Progress Notes (Signed)
Received fax from [facility] mastectomy products in [place] requesting recent office notes be faxed.  RN successfully faxed notes to [phone].

## 2023-03-10 ENCOUNTER — Ambulatory Visit: Payer: Medicaid Other | Attending: Hematology and Oncology | Admitting: Occupational Therapy

## 2023-03-10 ENCOUNTER — Ambulatory Visit: Payer: Medicaid Other | Admitting: Family Medicine

## 2023-03-10 ENCOUNTER — Other Ambulatory Visit: Payer: Self-pay | Admitting: Family Medicine

## 2023-03-10 ENCOUNTER — Encounter: Payer: Self-pay | Admitting: Family Medicine

## 2023-03-10 VITALS — BP 104/66 | HR 88 | Temp 97.3°F | Ht 63.0 in | Wt 184.2 lb

## 2023-03-10 DIAGNOSIS — M810 Age-related osteoporosis without current pathological fracture: Secondary | ICD-10-CM

## 2023-03-10 DIAGNOSIS — Z171 Estrogen receptor negative status [ER-]: Secondary | ICD-10-CM | POA: Insufficient documentation

## 2023-03-10 DIAGNOSIS — M792 Neuralgia and neuritis, unspecified: Secondary | ICD-10-CM

## 2023-03-10 DIAGNOSIS — I890 Lymphedema, not elsewhere classified: Secondary | ICD-10-CM | POA: Diagnosis present

## 2023-03-10 DIAGNOSIS — C50211 Malignant neoplasm of upper-inner quadrant of right female breast: Secondary | ICD-10-CM | POA: Insufficient documentation

## 2023-03-10 DIAGNOSIS — R42 Dizziness and giddiness: Secondary | ICD-10-CM

## 2023-03-10 DIAGNOSIS — N644 Mastodynia: Secondary | ICD-10-CM | POA: Insufficient documentation

## 2023-03-10 DIAGNOSIS — I951 Orthostatic hypotension: Secondary | ICD-10-CM

## 2023-03-10 DIAGNOSIS — F331 Major depressive disorder, recurrent, moderate: Secondary | ICD-10-CM

## 2023-03-10 DIAGNOSIS — M79601 Pain in right arm: Secondary | ICD-10-CM | POA: Diagnosis present

## 2023-03-10 DIAGNOSIS — Z483 Aftercare following surgery for neoplasm: Secondary | ICD-10-CM | POA: Diagnosis present
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
## 2023-03-10 NOTE — Assessment & Plan Note (Addendum)
Discussed latest DEXA results with patient.  She was on fosamax through oncology but stopped several months ago due to worsening nausea.  I asked her to reach out to onc about OP f/u plan - consider pricing out prolia again vs yearly zolendronic acid infusion

## 2023-03-10 NOTE — Assessment & Plan Note (Signed)
Orthostatics positive in office today.  Encouraged continued good water intake. Discussed ok to liberalize salt intake.  Discussed TCA may worsen orthostasis

## 2023-03-10 NOTE — Patient Instructions (Addendum)
Reach out to Dr Pamelia Hoit about osteoporosis plan and let them know you stopped fosamax pill.  I'm glad mood is better. Continue current medicines including amitriptyline 50mg  nightly.  Return in 3 months for follow up visit

## 2023-03-10 NOTE — Assessment & Plan Note (Addendum)
Chronic, improving with addition of amitriptyline - she notes improvement as she's sleeping better. SI has significantly improved as well.  Continue current regimen.

## 2023-03-10 NOTE — Assessment & Plan Note (Signed)
Chronic, overall unchanged despite adding TCA amitriptyline 50mg  nightly.  Also continues gabapentin 300mg  TID.  Dose titration of both limited due to side effects.

## 2023-03-10 NOTE — Progress Notes (Signed)
Ph: [phone]       Fax: [phone]   Patient ID: Sabrina Wright, female    DOB: July 06, 1961, 62 y.o.   MRN: [id number]  This visit was conducted in person.  BP 104/66 (Patient Position: Standing)   Pulse 88   Temp (!) 97.3 F (36.3 C) (Temporal)   Ht 5\' 3"  (1.6 m)   Wt 184 lb 4 oz (83.6 kg)   LMP 02/14/2005 (Approximate)   SpO2 96%   BMI 32.64 kg/m    Orthostatic Vitals for the past 48 hrs (Last 6 readings):  Patient Position BP Pulse  03/10/23 0845 -- 136/64 88  03/10/23 0850 Supine 124/68 --  03/10/23 0854 Standing 104/66 --   CC: 6-8 wk f/u mood  Subjective:   HPI: Sabrina Wright is a 62 y.o. female presenting on 03/10/2023 for Medical Management of Chronic Issues (Here for 6-8 wk mood f/u. Also, c/o ongoing dizziness. )   Ongoing dizziness, fatigue worse with cancer treatment. Orthostatics positive today. She feels she's staying well hydrated. Dizziness may be some worse since med changes.   Worsening depression/anxiety with SI (ideation without plan) - this is despite cymbalta 30mg  BID with xanax PRN.   Ongoing severe L arm neuropathic pain. Also notes swelling to R axilla.  She is going to occupational therapy once weekly.   Last visit we started amitriptyline 50mg  nightly, continued cymbalta but 60mg  in am (from 30mg  BID). She notes she's sleeping better.  OP - worsening osteoporosis based on recent DEXA 02/2023. She stopped fosamax due to significant nausea - was only on this for 6-7 months.   L breast cancer s/p lumpectomy 2003 then chemo and radiation.  R breast cancer 06/2021 s/p chemo followed by breast conserving surgery with SLN biopsy and adjuvant radiation therapy. ER neg - did not take anti-estrogen therapy.      Relevant past medical, surgical, family and social history reviewed and updated as indicated. Interim medical history since our last visit reviewed. Allergies and medications reviewed and updated. Outpatient Medications Prior to Visit   Medication Sig Dispense Refill   acetaminophen (TYLENOL) 325 MG tablet Take 2 tablets (650 mg total) by mouth every 6 (six) hours as needed for fever, headache or moderate pain.     alendronate (FOSAMAX) 70 MG tablet Take 1 tablet (70 mg total) by mouth every 7 (seven) days. Take with a full glass of water on an empty stomach. 4 tablet 11   ALPRAZolam (XANAX) 0.5 MG tablet TAKE 1/2 - 1 TABLETS (0.25-0.5 MG TOTAL) BY MOUTH AT BEDTIME. 30 tablet 3   amitriptyline (ELAVIL) 50 MG tablet Take 1 tablet (50 mg total) by mouth at bedtime. 90 tablet 2   Calcium Carb-Cholecalciferol (CALCIUM 600/VITAMIN D3) 600-800 MG-UNIT TABS Take 2 tablets by mouth daily. 60 tablet    Cholecalciferol (VITAMIN D) 50 MCG (2000 UT) CAPS Take 1 capsule (2,000 Units total) by mouth daily. 30 capsule    cyanocobalamin (V-R VITAMIN B-12) 500 MCG tablet Take 2 tablets (1,000 mcg total) by mouth daily.     DULoxetine (CYMBALTA) 30 MG capsule Take 1 capsule (30 mg total) by mouth 2 (two) times daily. 180 capsule 4   gabapentin (NEURONTIN) 300 MG capsule Take 1 capsule (300 mg total) by mouth 3 (three) times daily. 90 capsule 11   Multiple Vitamin (MULTIVITAMIN WITH MINERALS) TABS tablet Take 1 tablet by mouth at bedtime.     Na Sulfate-K Sulfate-Mg Sulf 17.5-3.13-1.6 GM/177ML SOLN Take by mouth  as directed.     SUMAtriptan (IMITREX) 100 MG tablet TAKE 1 TABLET BY MOUTH ONCE FOR 1 DOSE. MAY REPEAT IN 2 HOURS IF HEADACHE PERSISTS OR RECURS. 9 tablet 3   No facility-administered medications prior to visit.     Per HPI unless specifically indicated in ROS section below Review of Systems  Objective:  BP 104/66 (Patient Position: Standing)   Pulse 88   Temp (!) 97.3 F (36.3 C) (Temporal)   Ht 5\' 3"  (1.6 m)   Wt 184 lb 4 oz (83.6 kg)   LMP 02/14/2005 (Approximate)   SpO2 96%   BMI 32.64 kg/m   Wt Readings from Last 3 Encounters:  03/10/23 184 lb 4 oz (83.6 kg)  01/27/23 182 lb 4 oz (82.7 kg)  12/16/22 182 lb 2 oz (82.6  kg)      Physical Exam Vitals and nursing note reviewed.  Constitutional:      Appearance: Normal appearance. She is not ill-appearing.  HENT:     Mouth/Throat:     Mouth: Mucous membranes are moist.     Pharynx: Oropharynx is clear. No oropharyngeal exudate or posterior oropharyngeal erythema.  Eyes:     Extraocular Movements: Extraocular movements intact.     Pupils: Pupils are equal, round, and reactive to light.  Cardiovascular:     Rate and Rhythm: Normal rate and regular rhythm.     Pulses: Normal pulses.     Heart sounds: Normal heart sounds. No murmur heard. Pulmonary:     Effort: Pulmonary effort is normal. No respiratory distress.     Breath sounds: Normal breath sounds. No wheezing, rhonchi or rales.  Musculoskeletal:     Right lower leg: No edema.     Left lower leg: No edema.  Skin:    General: Skin is warm and dry.  Neurological:     Mental Status: She is alert.  Psychiatric:        Mood and Affect: Mood normal.        Behavior: Behavior normal.           03/10/2023    9:34 AM 01/27/2023   10:33 AM 12/16/2022    2:29 PM 06/17/2022    3:00 PM 02/20/2022    2:35 PM  Depression screen PHQ 2/9  Decreased Interest 1 2 2  0 3  Down, Depressed, Hopeless 1 3 2 1 2   PHQ - 2 Score 2 5 4 1 5   Altered sleeping 1 3 3  0 2  Tired, decreased energy 3 3 2 3 3   Change in appetite 0 0 0 0 1  Feeling bad or failure about yourself  2 3 3  0 2  Trouble concentrating 2 3 0 1 2  Moving slowly or fidgety/restless 0 0 0 0 0  Suicidal thoughts 1 2 1  0 0  PHQ-9 Score 11 19 13 5 15   Difficult doing work/chores Somewhat difficult Extremely dIfficult Extremely dIfficult         03/10/2023    9:35 AM 01/27/2023   10:33 AM 12/16/2022    2:29 PM 02/20/2022    2:36 PM  GAD 7 : Generalized Anxiety Score  Nervous, Anxious, on Edge 1 2 2 3   Control/stop worrying 2 3 2 3   Worry too much - different things 2 3 2 3   Trouble relaxing 1 3 2 2   Restless 1 0 0 0  Easily annoyed or irritable 1  3 0 2  Afraid - awful might happen 2 3 1  3  Total GAD 7 Score 10 17 9 16   Anxiety Difficulty Somewhat difficult Extremely difficult Extremely difficult Not difficult at all   Assessment & Plan:   Problem List Items Addressed This Visit     Neuropathic pain, arm    Chronic, overall unchanged despite adding TCA amitriptyline 50mg  nightly.  Also continues gabapentin 300mg  TID.  Dose titration of both limited due to side effects.      Osteoporosis    Discussed latest DEXA results with patient.  She was on fosamax through oncology but stopped several months ago due to worsening nausea.  I asked her to reach out to onc about OP f/u plan - consider pricing out prolia again vs yearly zolendronic acid infusion      MDD (major depressive disorder), recurrent episode, moderate (HCC) - Primary    Chronic, improving with addition of amitriptyline - she notes improvement as she's sleeping better. SI has significantly improved as well.  Continue current regimen.       Orthostatic hypotension    Orthostatics positive in office today.  Encouraged continued good water intake. Discussed ok to liberalize salt intake.  Discussed TCA may worsen orthostasis      Dizziness    Chronic, ongoing. Gabapentin and amitriptyline dose titration limited due to this.         No orders of the defined types were placed in this encounter.   No orders of the defined types were placed in this encounter.   Patient Instructions  Reach out to Dr Pamelia Hoit about osteoporosis plan and let them know you stopped fosamax pill.  I'm glad mood is better. Continue current medicines including amitriptyline 50mg  nightly.  Return in 3 months for follow up visit   Follow up plan: Return in about 3 months (around 06/10/2023) for follow up visit.  Eustaquio Boyden, MD

## 2023-03-10 NOTE — Assessment & Plan Note (Signed)
Chronic, ongoing. Gabapentin and amitriptyline dose titration limited due to this.

## 2023-03-11 NOTE — Telephone Encounter (Signed)
Pt's chart shows rx sent on 12/16/22, #180/4 with dx code: MDD [F33.1] attached.   Spoke with [facility] asking about request and relaying info above. States they have rx and correct info and to disregard request.  Request denied.

## 2023-03-17 ENCOUNTER — Ambulatory Visit: Payer: Medicaid Other | Admitting: Occupational Therapy

## 2023-03-17 DIAGNOSIS — M79601 Pain in right arm: Secondary | ICD-10-CM

## 2023-03-17 DIAGNOSIS — N644 Mastodynia: Secondary | ICD-10-CM

## 2023-03-17 DIAGNOSIS — I890 Lymphedema, not elsewhere classified: Secondary | ICD-10-CM
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
## 2023-03-24 ENCOUNTER — Ambulatory Visit: Payer: Medicaid Other | Admitting: Occupational Therapy

## 2023-03-24 DIAGNOSIS — M79601 Pain in right arm: Secondary | ICD-10-CM | POA: Diagnosis not present

## 2023-03-24 DIAGNOSIS — N644 Mastodynia: Secondary | ICD-10-CM

## 2023-03-24 DIAGNOSIS — Z483 Aftercare following surgery for neoplasm: Secondary | ICD-10-CM

## 2023-03-24 DIAGNOSIS — I890 Lymphedema, not elsewhere classified: Secondary | ICD-10-CM

## 2023-03-24 DIAGNOSIS — Z171 Estrogen receptor negative status [ER-]: Secondary | ICD-10-CM
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
## 2023-04-01 ENCOUNTER — Ambulatory Visit: Payer: Medicaid Other | Admitting: Occupational Therapy

## 2023-04-01 DIAGNOSIS — N644 Mastodynia: Secondary | ICD-10-CM

## 2023-04-01 DIAGNOSIS — Z171 Estrogen receptor negative status [ER-]: Secondary | ICD-10-CM

## 2023-04-01 DIAGNOSIS — I890 Lymphedema, not elsewhere classified: Secondary | ICD-10-CM

## 2023-04-01 DIAGNOSIS — M79601 Pain in right arm: Secondary | ICD-10-CM
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
## 2023-04-15 ENCOUNTER — Ambulatory Visit: Payer: Medicaid Other | Attending: Hematology and Oncology | Admitting: Occupational Therapy

## 2023-04-15 DIAGNOSIS — M79601 Pain in right arm: Secondary | ICD-10-CM | POA: Diagnosis present

## 2023-04-15 DIAGNOSIS — Z483 Aftercare following surgery for neoplasm: Secondary | ICD-10-CM

## 2023-04-15 DIAGNOSIS — C50211 Malignant neoplasm of upper-inner quadrant of right female breast: Secondary | ICD-10-CM | POA: Insufficient documentation

## 2023-04-15 DIAGNOSIS — N644 Mastodynia: Secondary | ICD-10-CM

## 2023-04-15 DIAGNOSIS — I890 Lymphedema, not elsewhere classified: Secondary | ICD-10-CM

## 2023-04-15 DIAGNOSIS — Z171 Estrogen receptor negative status [ER-]: Secondary | ICD-10-CM
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
## 2023-04-28 ENCOUNTER — Other Ambulatory Visit: Payer: Self-pay | Admitting: Hematology and Oncology

## 2023-04-28 DIAGNOSIS — Z9889 Other specified postprocedural states: Secondary | ICD-10-CM

## 2023-05-03 ENCOUNTER — Other Ambulatory Visit: Payer: Self-pay | Admitting: Family Medicine

## 2023-05-03 DIAGNOSIS — F410 Panic disorder [episodic paroxysmal anxiety] without agoraphobia: Secondary | ICD-10-CM

## 2023-05-05 NOTE — Telephone Encounter (Signed)
Name of Medication:  Alprazolam Name of Pharmacy:  [facility] Last Fill or Written Date and Quantity:  04/02/23, #30 Last Office Visit and Type:  03/10/23, 6-8 wk mood f/u Next Office Visit and Type:  06/10/23, 3 mo f/u Last Controlled Substance Agreement Date:  none Last UDS:  none

## 2023-05-06 ENCOUNTER — Ambulatory Visit: Payer: Medicaid Other | Attending: Hematology and Oncology | Admitting: Occupational Therapy

## 2023-05-06 DIAGNOSIS — M79601 Pain in right arm: Secondary | ICD-10-CM | POA: Insufficient documentation

## 2023-05-06 DIAGNOSIS — N644 Mastodynia: Secondary | ICD-10-CM | POA: Diagnosis present

## 2023-05-06 DIAGNOSIS — I890 Lymphedema, not elsewhere classified: Secondary | ICD-10-CM | POA: Insufficient documentation
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
## 2023-05-07 NOTE — Telephone Encounter (Signed)
ERx 

## 2023-05-27 ENCOUNTER — Ambulatory Visit: Payer: Medicaid Other | Admitting: Occupational Therapy

## 2023-05-27 DIAGNOSIS — M79601 Pain in right arm: Secondary | ICD-10-CM

## 2023-05-27 DIAGNOSIS — N644 Mastodynia: Secondary | ICD-10-CM
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
## 2023-06-02 ENCOUNTER — Other Ambulatory Visit: Payer: Self-pay | Admitting: Hematology and Oncology

## 2023-06-02 ENCOUNTER — Ambulatory Visit: Admission: RE | Admit: 2023-06-02 | Payer: Medicaid Other | Source: Ambulatory Visit

## 2023-06-02 ENCOUNTER — Ambulatory Visit
Admission: RE | Admit: 2023-06-02 | Discharge: 2023-06-02 | Disposition: A | Discharge status: Home / Self Care | Payer: Medicaid Other | Source: Ambulatory Visit | Attending: Hematology and Oncology | Admitting: Hematology and Oncology

## 2023-06-02 DIAGNOSIS — Z9889 Other specified postprocedural states: Secondary | ICD-10-CM

## 2023-06-10 ENCOUNTER — Encounter: Payer: Self-pay | Admitting: Family Medicine

## 2023-06-10 ENCOUNTER — Ambulatory Visit: Payer: Medicaid Other | Admitting: Family Medicine

## 2023-06-10 VITALS — BP 112/72 | HR 80 | Temp 97.2°F | Ht 63.0 in | Wt 180.1 lb

## 2023-06-10 DIAGNOSIS — K118 Other diseases of salivary glands: Secondary | ICD-10-CM

## 2023-06-10 DIAGNOSIS — I951 Orthostatic hypotension: Secondary | ICD-10-CM

## 2023-06-10 DIAGNOSIS — R221 Localized swelling, mass and lump, neck: Secondary | ICD-10-CM | POA: Diagnosis not present

## 2023-06-10 DIAGNOSIS — M792 Neuralgia and neuritis, unspecified: Secondary | ICD-10-CM

## 2023-06-10 DIAGNOSIS — F331 Major depressive disorder, recurrent, moderate: Secondary | ICD-10-CM

## 2023-06-10 LAB — CBC WITH DIFFERENTIAL/PLATELET
Basophils Absolute: 0.1 10*3/uL (ref 0.0–0.1)
Basophils Relative: 0.7 % (ref 0.0–3.0)
Eosinophils Absolute: 0.3 10*3/uL (ref 0.0–0.7)
Eosinophils Relative: 3.1 % (ref 0.0–5.0)
HCT: 43 % (ref 36.0–46.0)
Hemoglobin: 13.9 g/dL (ref 12.0–15.0)
Lymphocytes Relative: 15.5 % (ref 12.0–46.0)
Lymphs Abs: 1.3 10*3/uL (ref 0.7–4.0)
MCHC: 32.2 g/dL (ref 30.0–36.0)
MCV: 88.9 fl (ref 78.0–100.0)
Monocytes Absolute: 0.7 10*3/uL (ref 0.1–1.0)
Monocytes Relative: 7.7 % (ref 3.0–12.0)
Neutro Abs: 6.2 10*3/uL (ref 1.4–7.7)
Neutrophils Relative %: 73 % (ref 43.0–77.0)
Platelets: 379 10*3/uL (ref 150.0–400.0)
RBC: 4.84 Mil/uL (ref 3.87–5.11)
RDW: 14.3 % (ref 11.5–15.5)
WBC: 8.5 10*3/uL (ref 4.0–10.5)

## 2023-06-10 LAB — BASIC METABOLIC PANEL
BUN: 9 mg/dL (ref 6–23)
CO2: 26 mEq/L (ref 19–32)
Calcium: 9.6 mg/dL (ref 8.4–10.5)
Chloride: 101 mEq/L (ref 96–112)
Creatinine, Ser: 0.91 mg/dL (ref 0.40–1.20)
GFR: 67.7 mL/min (ref >60.00)
Glucose, Bld: 85 mg/dL (ref 70–99)
Potassium: 4.8 mEq/L (ref 3.5–5.1)
Sodium: 137 mEq/L (ref 135–145)

## 2023-06-10 LAB — AMYLASE: Amylase: 25 U/L — ABNORMAL LOW (ref 27–131)

## 2023-06-10 LAB — SEDIMENTATION RATE: Sed Rate: 34 mm/hr — ABNORMAL HIGH (ref 0–30)

## 2023-06-10 LAB — C-REACTIVE PROTEIN: CRP: 1.1 mg/dL (ref 0.5–20.0)

## 2023-06-10 MED ORDER — NORTRIPTYLINE HCL 50 MG PO CAPS: 50.0000 mg | ORAL_CAPSULE | Freq: Every day | ORAL | 6 refills | Status: DC

## 2023-06-10 MED ORDER — AMOXICILLIN-POT CLAVULANATE 875-125 MG PO TABS: 1.0000 | ORAL_TABLET | Freq: Two times a day (BID) | ORAL | 0 refills | Status: AC

## 2023-06-10 NOTE — Assessment & Plan Note (Signed)
See above- change TCA from amitrip to nortript.

## 2023-06-10 NOTE — Assessment & Plan Note (Signed)
Chronic, pt endorses overall improvement on current regimen.  However notes ongoing difficulty with orthostatic dizziness - will change amitriptyline to nortriptyline. Continue cymbalta 60mg  daily in am.

## 2023-06-10 NOTE — Patient Instructions (Addendum)
Return after 12/17/2023 for physical.  Continue cymbalta 60mg  daily. Change amitriptyline to nortriptyline - in same family but hopefully less dizziness side effect.  For parotitis - take augmentin antibiotic 10d course sent to pharmacy  Labs today  We will order neck ct for further evaluation.

## 2023-06-10 NOTE — Assessment & Plan Note (Addendum)
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  She describes purulent drainage from stensen's duct however unable to reproduce on exam. Rx augmentin 10d course. Check labwork including ESR, CRP, amylase, CBC.  Check contrasted neck CT given recurrent parotitis r/o mass, stone, other cause of recurrence, r/o complication such as fistula/abscess.  To ER if worsening. Consider ENT eval pending response to abx treatment.

## 2023-06-10 NOTE — Assessment & Plan Note (Signed)
Chronic, improvement noted with amitriptyline.  Will change to nortriptyline as per above. Continue gabapentin 300mg  TID

## 2023-06-10 NOTE — Progress Notes (Addendum)
Ph: [phone] Fax: [phone]   Patient ID: Sabrina Wright, female    DOB: 1961/05/05, 62 y.o.   MRN: [id number]  This visit was conducted in person.  BP 112/72   Pulse 80   Temp (!) 97.2 F (36.2 C) (Temporal)   Ht 5\' 3"  (1.6 m)   Wt 180 lb 2 oz (81.7 kg)   LMP 02/14/2005 (Approximate)   SpO2 93%   BMI 31.91 kg/m    CC: 3 mo mood f/u visit  Subjective:   HPI: Sabrina Wright is a 62 y.o. female presenting on 06/10/2023 for Medical Management of Chronic Issues (Here for 3 mo mood f/u.) and Cyst (C/o knot behind L ear. Started about 2 wks ago. Drains pus down throat when area is massaged. H/o same sxs. Seen for previously and told was a clogged saliva gland.)   See prior note for details - seen here 03/2023 with worsening depression with SI, treated with transition from cymbalta 60mg  in am and amitriptyline 50mg  at bedtime.   Ongoing neuropathic pain despite cymbalta, amitriptyline and gabapentin 300mg  TID. Titration limited by side effects.  Continued orthostatic dizziness.   2 wk h/o knot behind left ear that causes drainage down throat when she massages it. Previous episode of this told clogged salivary gland treated with IV decadron 10mg  and IV abx vanc/unasyn, discharged on augmentin to complete 10d course. No fevers/chills, facial pain, trismus.  09/2020 - CT neck with contrasted showed left parotitis, h/o R parotid atrophy since 2006.   No other salivary gland involvement.  H/o strep throat /tonsillitis.  No new joint joint pain, rashes.      Relevant past medical, surgical, family and social history reviewed and updated as indicated. Interim medical history since our last visit reviewed. Allergies and medications reviewed and updated. Outpatient Medications Prior to Visit  Medication Sig Dispense Refill   acetaminophen (TYLENOL) 325 MG tablet Take 2 tablets (650 mg total) by mouth every 6 (six) hours as needed for fever, headache or moderate pain.     alendronate  (FOSAMAX) 70 MG tablet Take 1 tablet (70 mg total) by mouth every 7 (seven) days. Take with a full glass of water on an empty stomach. 4 tablet 11   ALPRAZolam (XANAX) 0.5 MG tablet TAKE 1/2 TO 1 TABLET BY MOUTH AT BEDTIME 30 tablet 0   Calcium Carb-Cholecalciferol (CALCIUM 600/VITAMIN D3) 600-800 MG-UNIT TABS Take 2 tablets by mouth daily. 60 tablet    Cholecalciferol (VITAMIN D) 50 MCG (2000 UT) CAPS Take 1 capsule (2,000 Units total) by mouth daily. 30 capsule    cyanocobalamin (V-R VITAMIN B-12) 500 MCG tablet Take 2 tablets (1,000 mcg total) by mouth daily.     DULoxetine (CYMBALTA) 30 MG capsule Take 1 capsule (30 mg total) by mouth 2 (two) times daily. 180 capsule 4   gabapentin (NEURONTIN) 300 MG capsule Take 1 capsule (300 mg total) by mouth 3 (three) times daily. 90 capsule 11   Multiple Vitamin (MULTIVITAMIN WITH MINERALS) TABS tablet Take 1 tablet by mouth at bedtime.     Na Sulfate-K Sulfate-Mg Sulf 17.5-3.13-1.6 GM/177ML SOLN Take by mouth as directed.     SUMAtriptan (IMITREX) 100 MG tablet TAKE 1 TABLET BY MOUTH ONCE FOR 1 DOSE. MAY REPEAT IN 2 HOURS IF HEADACHE PERSISTS OR RECURS. 9 tablet 3   amitriptyline (ELAVIL) 50 MG tablet Take 1 tablet (50 mg total) by mouth at bedtime. 90 tablet 2   No facility-administered medications prior to visit.  Per HPI unless specifically indicated in ROS section below Review of Systems  Objective:  BP 112/72   Pulse 80   Temp (!) 97.2 F (36.2 C) (Temporal)   Ht 5\' 3"  (1.6 m)   Wt 180 lb 2 oz (81.7 kg)   LMP 02/14/2005 (Approximate)   SpO2 93%   BMI 31.91 kg/m   Wt Readings from Last 3 Encounters:  06/10/23 180 lb 2 oz (81.7 kg)  03/10/23 184 lb 4 oz (83.6 kg)  01/27/23 182 lb 4 oz (82.7 kg)      Physical Exam Vitals and nursing note reviewed.  Constitutional:      Appearance: Normal appearance. She is not ill-appearing.  HENT:     Head: Normocephalic and atraumatic.     Mouth/Throat:     Mouth: Mucous membranes are  moist.     Pharynx: Oropharynx is clear. No oropharyngeal exudate or posterior oropharyngeal erythema.     Comments: No obvious pus draining from stensen's duct with palpation of mass Eyes:     Extraocular Movements: Extraocular movements intact.     Pupils: Pupils are equal, round, and reactive to light.  Neck:     Thyroid: No thyroid mass or thyromegaly.      Comments: Tender swelling inferior to angle of jaw Cardiovascular:     Rate and Rhythm: Normal rate and regular rhythm.     Pulses: Normal pulses.     Heart sounds: Normal heart sounds. No murmur heard. Pulmonary:     Effort: Pulmonary effort is normal. No respiratory distress.     Breath sounds: Normal breath sounds. No wheezing, rhonchi or rales.  Musculoskeletal:     Cervical back: Normal range of motion and neck supple. Tenderness present. No rigidity.  Lymphadenopathy:     Cervical: Cervical adenopathy present.  Skin:    General: Skin is warm and dry.     Findings: No erythema or rash.  Neurological:     Mental Status: She is alert.     Cranial Nerves: Cranial nerves 2-12 are intact.     Sensory: Sensation is intact.     Motor: Motor function is intact.     Comments:  CN 2-12 intact EOMI FTN intact Unsteady on her feet, chronic  Psychiatric:        Mood and Affect: Mood normal.        Behavior: Behavior normal.       Lab Results  Component Value Date   NA 139 10/10/2022   CL 105 10/10/2022   K 3.8 10/10/2022   CO2 26 10/10/2022   BUN 10 10/10/2022   CREATININE 0.78 10/10/2022   GFRNONAA >60 10/10/2022   CALCIUM 9.7 10/10/2022   ALBUMIN 4.1 10/10/2022   GLUCOSE 96 10/10/2022       06/10/2023    9:02 AM 03/10/2023    9:34 AM 01/27/2023   10:33 AM 12/16/2022    2:29 PM 06/17/2022    3:00 PM  Depression screen PHQ 2/9  Decreased Interest 1 1 2 2  0  Down, Depressed, Hopeless 1 1 3 2 1   PHQ - 2 Score 2 2 5 4 1   Altered sleeping 2 1 3 3  0  Tired, decreased energy 2 3 3 2 3   Change in appetite 1 0 0 0 0   Feeling bad or failure about yourself  1 2 3 3  0  Trouble concentrating 2 2 3  0 1  Moving slowly or fidgety/restless 2 0 0 0 0  Suicidal thoughts  1 1 2 1  0  PHQ-9 Score 13 11 19 13 5   Difficult doing work/chores Somewhat difficult Somewhat difficult Extremely dIfficult Extremely dIfficult        06/10/2023    9:02 AM 03/10/2023    9:35 AM 01/27/2023   10:33 AM 12/16/2022    2:29 PM  GAD 7 : Generalized Anxiety Score  Nervous, Anxious, on Edge 1 1 2 2   Control/stop worrying 1 2 3 2   Worry too much - different things 1 2 3 2   Trouble relaxing 1 1 3 2   Restless 0 1 0 0  Easily annoyed or irritable 1 1 3  0  Afraid - awful might happen 1 2 3 1   Total GAD 7 Score 6 10 17 9   Anxiety Difficulty Somewhat difficult Somewhat difficult Extremely difficult Extremely difficult   Assessment & Plan:   Problem List Items Addressed This Visit     Neuropathic pain, arm    Chronic, improvement noted with amitriptyline.  Will change to nortriptyline as per above. Continue gabapentin 300mg  TID      MDD (major depressive disorder), recurrent episode, moderate (HCC)    Chronic, pt endorses overall improvement on current regimen.  However notes ongoing difficulty with orthostatic dizziness - will change amitriptyline to nortriptyline. Continue cymbalta 60mg  daily in am.       Relevant Medications   nortriptyline (PAMELOR) 50 MG capsule   Orthostatic hypotension    See above- change TCA from amitrip to nortript.       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  She describes purulent drainage from stensen's duct however unable to reproduce on exam. Rx augmentin 10d course. Check labwork including ESR, CRP, amylase, CBC.  Check contrasted neck CT given recurrent parotitis r/o mass, stone, other cause of recurrence, r/o complication such as fistula/abscess.  To ER if worsening. Consider ENT eval pending response to abx treatment.        Relevant Orders   CBC with Differential/Platelet   Sedimentation rate   C-reactive protein   ANA   CT Soft Tissue Neck W Contrast   Basic metabolic panel   Amylase     Meds ordered this encounter  Medications   nortriptyline (PAMELOR) 50 MG capsule    Sig: Take 1 capsule (50 mg total) by mouth at bedtime.    Dispense:  30 capsule    Refill:  6   amoxicillin-clavulanate (AUGMENTIN) 875-125 MG tablet    Sig: Take 1 tablet by mouth 2 (two) times daily for 10 days.    Dispense:  20 tablet    Refill:  0    Orders Placed This Encounter  Procedures   CT Soft Tissue Neck W Contrast    Standing Status:   Future    Standing Expiration Date:   06/09/2024    Order Specific Question:   If indicated for the ordered procedure, I authorize the administration of contrast media per Radiology protocol    Answer:   Yes    Order Specific Question:   Does the patient have a contrast media/X-ray dye allergy?    Answer:   No    Order Specific Question:   Preferred imaging location?    Answer:   [facility]   CBC with Differential/Platelet   Sedimentation rate   C-reactive protein   ANA   Basic metabolic panel   Amylase    Patient Instructions  Return  after 12/17/2023 for physical.  Continue cymbalta 60mg  daily. Change amitriptyline to nortriptyline - in same family but hopefully less dizziness side effect.  For parotitis - take augmentin antibiotic 10d course sent to pharmacy  Labs today  We will order neck ct for further evaluation.   Follow up plan: Return in about 6 months (around 12/17/2023) for annual exam, prior fasting for blood work.  Eustaquio Boyden, MD

## 2023-06-11 ENCOUNTER — Ambulatory Visit
Admission: RE | Admit: 2023-06-11 | Discharge: 2023-06-11 | Disposition: A | Discharge status: Home / Self Care | Payer: Medicaid Other | Source: Ambulatory Visit | Attending: Family Medicine | Admitting: Family Medicine

## 2023-06-11 DIAGNOSIS — R221 Localized swelling, mass and lump, neck: Secondary | ICD-10-CM

## 2023-06-11 MED ORDER — IOPAMIDOL (ISOVUE-300) INJECTION 61%
75.0000 mL | Freq: Once | INTRAVENOUS | Status: AC | PRN
  Administered 2023-06-11: 75 mL via INTRAVENOUS

## 2023-06-16 ENCOUNTER — Ambulatory Visit: Payer: Medicaid Other | Admitting: Occupational Therapy

## 2023-07-02 ENCOUNTER — Other Ambulatory Visit: Payer: Self-pay | Admitting: Family Medicine

## 2023-07-02 DIAGNOSIS — F410 Panic disorder [episodic paroxysmal anxiety] without agoraphobia: Secondary | ICD-10-CM

## 2023-07-03 NOTE — Telephone Encounter (Signed)
ERx 

## 2023-07-03 NOTE — Telephone Encounter (Signed)
Name of Medication:  Alprazolam Name of Pharmacy:  [facility] Last Fill or Written Date and Quantity:  05/07/23, #30 Last Office Visit and Type:  06/10/23 3 mo f/u Next Office Visit and Type:  Not scheduled Last Controlled Substance Agreement Date:  none Last UDS:  none

## 2023-08-11 ENCOUNTER — Other Ambulatory Visit: Payer: Self-pay | Admitting: Family Medicine

## 2023-08-11 DIAGNOSIS — F410 Panic disorder [episodic paroxysmal anxiety] without agoraphobia: Secondary | ICD-10-CM

## 2023-08-13 NOTE — Telephone Encounter (Signed)
Name of Medication:  Alprazolam Name of Pharmacy:  [facility] Last Fill or Written Date and Quantity:  07/03/23, #30/ 0 refill Last Office Visit and Type:  06/10/23 3 mo f/u Next Office Visit and Type:  Not scheduled Last Controlled Substance Agreement Date:  none Last UDS:  none

## 2023-08-14 NOTE — Telephone Encounter (Signed)
ERx 

## 2023-08-22 ENCOUNTER — Encounter: Payer: Self-pay | Admitting: Hematology and Oncology

## 2023-08-22 NOTE — Telephone Encounter (Signed)
TC
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
## 2023-09-18 ENCOUNTER — Other Ambulatory Visit: Payer: Self-pay | Admitting: Family Medicine

## 2023-09-18 ENCOUNTER — Ambulatory Visit: Payer: Medicaid Other | Attending: Cardiology | Admitting: Cardiology

## 2023-09-18 ENCOUNTER — Encounter: Payer: Self-pay | Admitting: Cardiology

## 2023-09-18 VITALS — BP 102/74 | HR 85 | Ht 64.0 in | Wt 187.6 lb

## 2023-09-18 DIAGNOSIS — R9431 Abnormal electrocardiogram [ECG] [EKG]: Secondary | ICD-10-CM | POA: Diagnosis not present

## 2023-09-18 DIAGNOSIS — R002 Palpitations: Secondary | ICD-10-CM | POA: Diagnosis not present

## 2023-09-18 DIAGNOSIS — I951 Orthostatic hypotension: Secondary | ICD-10-CM | POA: Diagnosis not present

## 2023-09-18 DIAGNOSIS — F410 Panic disorder [episodic paroxysmal anxiety] without agoraphobia: Secondary | ICD-10-CM

## 2023-09-18 DIAGNOSIS — I447 Left bundle-branch block, unspecified: Secondary | ICD-10-CM | POA: Diagnosis not present

## 2023-09-18 NOTE — Telephone Encounter (Signed)
Last filled 08-14-23 #30 Last OV 06-10-23 No Future OV [facility]

## 2023-09-18 NOTE — Patient Instructions (Addendum)
Medication Instructions:  Continue current medication  *If you need a refill on your cardiac medications before your next appointment, please call your pharmacy*   Lab Work: none If you have labs (blood work) drawn today and your tests are completely normal, you will receive your results only by: MyChart Message (if you have MyChart) OR A paper copy in the mail If you have any lab test that is abnormal or we need to change your treatment, we will call you to review the results.   Testing/Procedures: ECHO  to be done in one  year   Your physician has requested that you have an echocardiogram. Echocardiography is a painless test that uses sound waves to create images of your heart. It provides your doctor with information about the size and shape of your heart and how well your heart's chambers and valves are working. This procedure takes approximately one hour. There are no restrictions for this procedure. Please do NOT wear cologne, perfume, aftershave, or lotions (deodorant is allowed). Please arrive 15 minutes prior to your appointment time.  Please note: We ask at that you not bring children with you during ultrasound (echo/ vascular) testing. Due to room size and safety concerns, children are not allowed in the ultrasound rooms during exams. Our front office staff cannot provide observation of children in our lobby area while testing is being conducted. An adult accompanying a patient to their appointment will only be allowed in the ultrasound room at the discretion of the ultrasound technician under special circumstances. We apologize for any inconvenience.    Follow-Up: At [facility], you and your health needs are our priority.  As part of our continuing mission to provide you with exceptional heart care, we have created designated Provider Care Teams.  These Care Teams include your primary Cardiologist (physician) and Advanced Practice Providers (APPs -  Physician Assistants  and Nurse Practitioners) who all work together to provide you with the care you need, when you need it.  We recommend signing up for the patient portal called "MyChart".  Sign up information is provided on this After Visit Summary.  MyChart is used to connect with patients for Virtual Visits (Telemedicine).  Patients are able to view lab/test results, encounter notes, upcoming appointments, etc.  Non-urgent messages can be sent to your provider as well.   To learn more about what you can do with MyChart, go to [email or web address].    Your next appointment:   1 year(s)  Provider:   Dr. Bjorn Pippin

## 2023-09-19 NOTE — Telephone Encounter (Signed)
ERx 

## 2023-09-25 ENCOUNTER — Encounter: Payer: Self-pay | Admitting: Primary Care

## 2023-09-25 ENCOUNTER — Ambulatory Visit: Payer: Medicaid Other | Admitting: Primary Care

## 2023-09-25 VITALS — BP 100/78 | HR 76 | Temp 97.8°F | Ht 64.0 in | Wt 185.0 lb

## 2023-09-25 DIAGNOSIS — R221 Localized swelling, mass and lump, neck: Secondary | ICD-10-CM | POA: Diagnosis not present

## 2023-09-25 NOTE — Patient Instructions (Signed)
 You will either be contacted via phone regarding your referral to ENT, or you may receive a letter on your MyChart portal from our referral team with instructions for scheduling an appointment. Please let us know if you have not been contacted by anyone within two weeks.  It was a pleasure to see you today!
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
## 2023-09-25 NOTE — Assessment & Plan Note (Signed)
Continued. No evidence of acute parotitis on exam today.  Reviewed CT soft tissue neck from August 2024. Reviewed labs from August 2024.  Given ongoing symptoms will refer to ENT for further evaluation.  She agrees. Referral placed.

## 2023-09-30 ENCOUNTER — Encounter: Payer: Self-pay | Admitting: *Deleted

## 2023-10-13 ENCOUNTER — Inpatient Hospital Stay: Payer: Medicaid Other | Attending: Hematology and Oncology

## 2023-10-13 ENCOUNTER — Other Ambulatory Visit: Payer: Self-pay | Admitting: Hematology and Oncology

## 2023-10-13 ENCOUNTER — Inpatient Hospital Stay (HOSPITAL_BASED_OUTPATIENT_CLINIC_OR_DEPARTMENT_OTHER): Payer: Medicaid Other | Admitting: Hematology and Oncology

## 2023-10-13 VITALS — BP 111/66 | HR 83 | Temp 97.2°F | Resp 18 | Ht 64.0 in | Wt 184.7 lb

## 2023-10-13 DIAGNOSIS — M79603 Pain in arm, unspecified: Secondary | ICD-10-CM | POA: Insufficient documentation

## 2023-10-13 DIAGNOSIS — C50211 Malignant neoplasm of upper-inner quadrant of right female breast: Secondary | ICD-10-CM

## 2023-10-13 DIAGNOSIS — Z171 Estrogen receptor negative status [ER-]: Secondary | ICD-10-CM

## 2023-10-13 DIAGNOSIS — M810 Age-related osteoporosis without current pathological fracture: Secondary | ICD-10-CM | POA: Insufficient documentation

## 2023-10-13 DIAGNOSIS — I951 Orthostatic hypotension: Secondary | ICD-10-CM | POA: Insufficient documentation

## 2023-10-13 DIAGNOSIS — G8929 Other chronic pain: Secondary | ICD-10-CM | POA: Insufficient documentation

## 2023-10-13 DIAGNOSIS — M25519 Pain in unspecified shoulder: Secondary | ICD-10-CM | POA: Diagnosis not present

## 2023-10-13 DIAGNOSIS — Z853 Personal history of malignant neoplasm of breast: Secondary | ICD-10-CM | POA: Insufficient documentation

## 2023-10-13 DIAGNOSIS — Z923 Personal history of irradiation: Secondary | ICD-10-CM | POA: Diagnosis not present

## 2023-10-13 DIAGNOSIS — Z9221 Personal history of antineoplastic chemotherapy: Secondary | ICD-10-CM | POA: Insufficient documentation

## 2023-10-13 LAB — CBC WITH DIFFERENTIAL (CANCER CENTER ONLY)
Abs Immature Granulocytes: 0.03 10*3/uL (ref 0.00–0.07)
Basophils Absolute: 0.1 10*3/uL (ref 0.0–0.1)
Basophils Relative: 1 %
Eosinophils Absolute: 0.3 10*3/uL (ref 0.0–0.5)
Eosinophils Relative: 4 %
HCT: 41.9 % (ref 36.0–46.0)
Hemoglobin: 13.6 g/dL (ref 12.0–15.0)
Immature Granulocytes: 0 %
Lymphocytes Relative: 17 %
Lymphs Abs: 1.3 10*3/uL (ref 0.7–4.0)
MCH: 28.4 pg (ref 26.0–34.0)
MCHC: 32.5 g/dL (ref 30.0–36.0)
MCV: 87.5 fL (ref 80.0–100.0)
Monocytes Absolute: 0.7 10*3/uL (ref 0.1–1.0)
Monocytes Relative: 9 %
Neutro Abs: 5.4 10*3/uL (ref 1.7–7.7)
Neutrophils Relative %: 69 %
Platelet Count: 365 10*3/uL (ref 150–400)
RBC: 4.79 MIL/uL (ref 3.87–5.11)
RDW: 13.8 % (ref 11.5–15.5)
WBC Count: 7.7 10*3/uL (ref 4.0–10.5)
nRBC: 0 % (ref 0.0–0.2)

## 2023-10-13 LAB — CMP (CANCER CENTER ONLY)
ALT: 11 U/L (ref 0–44)
AST: 17 U/L (ref 15–41)
Albumin: 4.1 g/dL (ref 3.5–5.0)
Alkaline Phosphatase: 110 U/L (ref 38–126)
Anion gap: 7 (ref 5–15)
BUN: 10 mg/dL (ref 8–23)
CO2: 27 mmol/L (ref 22–32)
Calcium: 9.5 mg/dL (ref 8.9–10.3)
Chloride: 103 mmol/L (ref 98–111)
Creatinine: 0.95 mg/dL (ref 0.44–1.00)
GFR, Estimated: >60 mL/min (ref >60)
Glucose, Bld: 78 mg/dL (ref 70–99)
Potassium: 4.3 mmol/L (ref 3.5–5.1)
Sodium: 137 mmol/L (ref 135–145)
Total Bilirubin: 0.4 mg/dL (ref <1.2)
Total Protein: 7.3 g/dL (ref 6.5–8.1)

## 2023-10-13 LAB — VITAMIN B12: Vitamin B-12: 302 pg/mL (ref 180–914)

## 2023-10-13 NOTE — Assessment & Plan Note (Addendum)
History of left breast cancer status postlumpectomy 2003 status post chemo with Adriamycin, radiation 06/05/2021: Pain in the right breast: Mammogram and ultrasound revealed indeterminate masses in the right breast spanning 2.4 cm biopsy revealed grade 3 IDC ER 5%, PR 0%, 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  Neoadjuvant chemotherapy with Taxotere and Cytoxan x4 (06/19/21- 08/21/21) 2. 10/05/2021: Breast conserving surgery with sentinel lymph node biopsy: 0.4 cm grade 2 IDC with DCIS 0/6 LN Neg, ER 0%, PR 0%, Ki-67 5%, HER2 0  3.  Adjuvant radiation therapy 11/09/2021-12/24/2021 4.  Based on final pathology being ER negative, I did not recommend antiestrogen therapy.  Capecitabine started 01/02/2022 discontinued 02/21/2022 because of adverse effects especially nausea ----------------------------------------------------------------------------------------------------------------------- Right shoulder and breast pain: Constant in nature.  This is fine of gabapentin and Cymbalta.  I will send a referral to physical therapy for dry needling.  She will also try CBD oil.   Breast cancer surveillance: Breast exam 10/13/2023: Benign Mammogram and ultrasound 06/02/2023: Benign breast density category B Bone density April 2024: T-score -3.4: Profound osteoporosis: It appears patient is not taking bisphosphonates.  I recommended Zometa infusions every 6 months.   Return to clinic in 1 year for follow-up

## 2023-10-13 NOTE — Progress Notes (Signed)
Patient Care Team: Eustaquio Boyden, MD as PCP - General (Family Medicine) Little Ishikawa, MD as PCP - Cardiology (Cardiology) Serena Croissant, MD as Consulting Physician (Hematology and Oncology) Antony Blackbird, MD as Consulting Physician (Radiation Oncology) Griselda Miner, MD as Consulting Physician (General Surgery) Axel Filler Larna Daughters, NP as Nurse Practitioner (Hematology and Oncology)  DIAGNOSIS:  Encounter Diagnosis  Name Primary?   Malignant neoplasm of upper-inner quadrant of right breast in female, estrogen receptor negative (HCC) Yes    SUMMARY OF ONCOLOGIC HISTORY: Oncology History  History of left breast cancer  06/05/2021 Initial Biopsy   History of left breast cancer status postlumpectomy 2003 Pain in the right breast: Mammogram and ultrasound revealed indeterminate masses in the right breast spanning 2.4 cm biopsy revealed grade 3 IDC ER 5%, PR 0%, Ki67 60%, HER2 negative   Pain of left breast (Resolved)  Malignant neoplasm of upper-inner quadrant of right breast in female, estrogen receptor negative (HCC)  06/05/2021 Initial Biopsy   History of left breast cancer status postlumpectomy 2003 Pain in the right breast: Mammogram and ultrasound revealed indeterminate masses in the right breast spanning 2.4 cm biopsy revealed grade 3 IDC ER 5%, PR 0%, Ki67 60%, HER2 negative   06/11/2021 Cancer Staging   Staging form: Breast, AJCC 8th Edition - Clinical stage from 06/11/2021: Stage IIB (cT2, cN0, cM0, G3, ER-, PR-, HER2-) - Signed by Serena Croissant, MD on 06/11/2021 Stage prefix: Initial diagnosis Histologic grading system: 3 grade system   06/19/2021 - 08/11/2021 Chemotherapy   Neoadjuvant chemotherapy: Taxotere and Cytoxan x4 cycles   10/05/2021 Surgery   Right lumpectomy: 0.4 cm grade 2 IDC with DCIS 0/6 LN Neg, ER 0%, PR 0%, Ki-67 5%, HER2 0   11/09/2021 - 12/24/2021 Radiation Therapy   Site Technique Total Dose (Gy) Dose per Fx (Gy) Completed Fx Beam Energies   Breast, Right: Breast_R 3D 50.4/50.4 1.8 28/28 10X  Breast, Right: Breast_R_Bst 3D 10/10 2 5/5 6X, 10X       01/02/2022 - 02/21/2022 Chemotherapy    Adjuvant capecitabine (couldn't tolerate the side effects: Nausea)     CHIEF COMPLIANT: Surveillance of breast cancer, osteoporosis  HISTORY OF PRESENT ILLNESS:  History of Present Illness   The patient, with a history of cancer, presents with persistent pain in the shoulder and arm, described as a burning sensation. Despite increasing the dose of gabapentin last year, the pain has not improved. The patient has not tried CBD oil for pain relief, but is open to trying it. The patient also reports a constant pain in the shoulder, which is described as annoying.  In addition to the persistent pain, the patient reports experiencing dizziness upon standing, which has resulted in falls. The patient's blood pressure drops from laying to sitting and sitting to standing. The patient is also dealing with osteoporosis and has been unable to take Fosamax due to side effects.  The patient also reports a swelling behind the ear that extends down the throat, causing discomfort. The patient is scheduled to see a specialist for this issue.         ALLERGIES:  is allergic to codeine, egg-derived products, and gadavist [gadobutrol].  MEDICATIONS:  Current Outpatient Medications  Medication Sig Dispense Refill   acetaminophen (TYLENOL) 325 MG tablet Take 2 tablets (650 mg total) by mouth every 6 (six) hours as needed for fever, headache or moderate pain.     ALPRAZolam (XANAX) 0.5 MG tablet TAKE 1/2-1 TABLET BY MOUTH AT BEDTIME 30 tablet  0   Calcium Carb-Cholecalciferol (CALCIUM 600/VITAMIN D3) 600-800 MG-UNIT TABS Take 2 tablets by mouth daily. 60 tablet    Cholecalciferol (VITAMIN D) 50 MCG (2000 UT) CAPS Take 1 capsule (2,000 Units total) by mouth daily. 30 capsule    cyanocobalamin (V-R VITAMIN B-12) 500 MCG tablet Take 2 tablets (1,000 mcg total) by  mouth daily.     DULoxetine (CYMBALTA) 30 MG capsule Take 1 capsule (30 mg total) by mouth 2 (two) times daily. 180 capsule 4   gabapentin (NEURONTIN) 300 MG capsule Take 1 capsule (300 mg total) by mouth 3 (three) times daily. 90 capsule 11   Multiple Vitamin (MULTIVITAMIN WITH MINERALS) TABS tablet Take 1 tablet by mouth at bedtime.     Na Sulfate-K Sulfate-Mg Sulf 17.5-3.13-1.6 GM/177ML SOLN Take by mouth as directed.     nortriptyline (PAMELOR) 50 MG capsule TAKE 1 CAPSULE BY MOUTH AT BEDTIME. 90 capsule 3   SUMAtriptan (IMITREX) 100 MG tablet TAKE 1 TABLET BY MOUTH ONCE FOR 1 DOSE. MAY REPEAT IN 2 HOURS IF HEADACHE PERSISTS OR RECURS. 9 tablet 3   No current facility-administered medications for this visit.    PHYSICAL EXAMINATION: ECOG PERFORMANCE STATUS: 1 - Symptomatic but completely ambulatory  Vitals:   10/13/23 0856  BP: 111/66  Pulse: 83  Resp: 18  Temp: (!) 97.2 F (36.2 C)  SpO2: 98%   Filed Weights   10/13/23 0856  Weight: 184 lb 11.2 oz (83.8 kg)    Physical Exam   MUSCULOSKELETAL: Bilateral shoulder pain and swelling, more pronounced on the right side. SKIN: Skin mottled with a marbly texture.      (exam performed in the presence of a chaperone)  LABORATORY DATA:  I have reviewed the data as listed    Latest Ref Rng & Units 10/13/2023    8:43 AM 06/10/2023    9:46 AM 10/10/2022    8:21 AM  CMP  Glucose 70 - 99 mg/dL 78  85  96   BUN 8 - 23 mg/dL 10  9  10    Creatinine 0.44 - 1.00 mg/dL 0.27  2.53  6.64   Sodium 135 - 145 mmol/L 137  137  139   Potassium 3.5 - 5.1 mmol/L 4.3  4.8  3.8   Chloride 98 - 111 mmol/L 103  101  105   CO2 22 - 32 mmol/L 27  26  26    Calcium 8.9 - 10.3 mg/dL 9.5  9.6  9.7   Total Protein 6.5 - 8.1 g/dL 7.3   7.1   Total Bilirubin <1.2 mg/dL 0.4   0.4   Alkaline Phos 38 - 126 U/L 110   94   AST 15 - 41 U/L 17   16   ALT 0 - 44 U/L 11   9     Lab Results  Component Value Date   WBC 7.7 10/13/2023   HGB 13.6 10/13/2023    HCT 41.9 10/13/2023   MCV 87.5 10/13/2023   PLT 365 10/13/2023   NEUTROABS 5.4 10/13/2023    ASSESSMENT & PLAN:  Malignant neoplasm of upper-inner quadrant of right breast in female, estrogen receptor negative (HCC) History of left breast cancer status postlumpectomy 2003 status post chemo with Adriamycin, radiation 06/05/2021: Pain in the right breast: Mammogram and ultrasound revealed indeterminate masses in the right breast spanning 2.4 cm biopsy revealed grade 3 IDC ER 5%, PR 0%, 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  Neoadjuvant chemotherapy with Taxotere and Cytoxan  x4 (06/19/21- 08/21/21) 2. 10/05/2021: Breast conserving surgery with sentinel lymph node biopsy: 0.4 cm grade 2 IDC with DCIS 0/6 LN Neg, ER 0%, PR 0%, Ki-67 5%, HER2 0  3.  Adjuvant radiation therapy 11/09/2021-12/24/2021 4.  Based on final pathology being ER negative, I did not recommend antiestrogen therapy.  Capecitabine started 01/02/2022 discontinued 02/21/2022 because of adverse effects especially nausea ----------------------------------------------------------------------------------------------------------------------- Right shoulder and breast pain: Constant in nature.  This is fine of gabapentin and Cymbalta.  I will send a referral to physical therapy for dry needling.  She will also try CBD oil.   Breast cancer surveillance: Breast exam 10/13/2023: Benign Mammogram and ultrasound 06/02/2023: Benign breast density category B Bone density April 2024: T-score -3.4: Profound osteoporosis: It appears patient is not taking bisphosphonates.  I recommended Zometa infusions every 6 months.    Chronic Pain Persistent pain in the shoulder and arm despite gabapentin and Cymbalta. Discussed the potential benefits of CBD oil and dry needling. -Try CBD oil for pain relief. -Referral to physical therapy for dry needling.  Osteoporosis Previous use of Prolia discontinued due to insurance coverage. Discussed the benefits of  Zometa infusions. -Start Zometa infusions every six months.  Orthostatic Hypotension Reports dizziness upon standing. No current use of blood pressure medications. -Continue current management strategies including increased salt intake and use of compression socks.  Follow-up in six months.      Orders Placed This Encounter  Procedures   Ambulatory referral to Physical Therapy    Referral Priority:   Routine    Referral Type:   Physical Medicine    Referral Reason:   Specialty Services Required    Requested Specialty:   Physical Therapy    Number of Visits Requested:   1   The patient has a good understanding of the overall plan. she agrees with it. she will call with any problems that may develop before the next visit here. Total time spent: 30 mins including face to face time and time spent for planning, charting and co-ordination of care   Tamsen Meek, MD 10/13/23

## 2023-10-24 ENCOUNTER — Other Ambulatory Visit: Payer: Self-pay | Admitting: Family Medicine

## 2023-10-24 ENCOUNTER — Other Ambulatory Visit: Payer: Self-pay | Admitting: Hematology and Oncology

## 2023-10-24 DIAGNOSIS — F410 Panic disorder [episodic paroxysmal anxiety] without agoraphobia: Secondary | ICD-10-CM

## 2023-10-24 NOTE — Telephone Encounter (Signed)
Name of Medication:  Alprazolam Name of Pharmacy:  [facility] Last Fill or Written Date and Quantity:  09/19/23, #30 Last Office Visit and Type:  06/10/23 3 mo f/u Next Office Visit and Type:  None Last Controlled Substance Agreement Date:  none Last UDS:  none

## 2023-10-25 NOTE — Telephone Encounter (Signed)
ERx 

## 2023-11-03 ENCOUNTER — Inpatient Hospital Stay: Payer: Medicaid Other

## 2023-11-03 VITALS — BP 127/74 | HR 75 | Temp 98.2°F | Resp 16

## 2023-11-03 DIAGNOSIS — M810 Age-related osteoporosis without current pathological fracture: Secondary | ICD-10-CM | POA: Diagnosis not present

## 2023-11-03 MED ORDER — ZOLEDRONIC ACID 4 MG/100ML IV SOLN
4.0000 mg | Freq: Once | INTRAVENOUS | Status: AC
  Administered 2023-11-03: 4 mg via INTRAVENOUS
  Filled 2023-11-03: qty 100

## 2023-11-03 MED ORDER — SODIUM CHLORIDE 0.9% FLUSH: 10.0000 mL | Freq: Two times a day (BID) | INTRAVENOUS | Status: DC

## 2023-11-03 MED ORDER — SODIUM CHLORIDE 0.9 % IV SOLN
INTRAVENOUS | Status: DC
Start: 2023-11-03 — End: 2023-11-03

## 2023-11-03 NOTE — Patient Instructions (Signed)

## 2023-11-03 NOTE — Progress Notes (Signed)
Pt presenting for first time Zometa. Calcium on 10/13/23 was 9.5. Pt states she has not seen a dentist since last year. Pt denies dental concerns at the moment but does endorse intermittent jaw pain dt known swollen salivary gland on left side. Lillard Anes, NP notified. Okay to treat today. Pt educated to call if jaw pain increases in frequency/duration or pain level/type. Pt verbalized understanding. Pt educated on importance of regular dental visits (at least Q14months dt risk of osteonecrosis of jaw). Verbalized understanding. Proceeding with Zometa therapy today.

## 2023-12-08 ENCOUNTER — Other Ambulatory Visit: Payer: Self-pay | Admitting: Family Medicine

## 2023-12-08 DIAGNOSIS — F410 Panic disorder [episodic paroxysmal anxiety] without agoraphobia: Secondary | ICD-10-CM

## 2023-12-08 NOTE — Telephone Encounter (Signed)
Name of Medication:  Alprazolam Name of Pharmacy:  [facility] Last Fill or Written Date and Quantity:  10/25/23, #30/ 0 refill Last Office Visit and Type:  06/10/23 3 mo mood f/u Next Office Visit and Type:  None Last Controlled Substance Agreement Date:  none Last UDS:  none

## 2023-12-08 NOTE — Telephone Encounter (Signed)
Copied from CRM [id number]. Topic: Clinical - Medication Refill >> Dec 08, 2023 11:55 AM Steele Sizer wrote: Most Recent Primary Care Visit:  Provider: Doreene Nest  Department: [facility]  Visit Type: SAME DAY  Date: 09/25/2023  Medication: ALPRAZolam (XANAX) 0.5 MG tablet   Has the patient contacted their pharmacy? Yes (Agent: If no, request that the patient contact the pharmacy for the refill. If patient does not wish to contact the pharmacy document the reason why and proceed with request.) (Agent: If yes, when and what did the pharmacy advise?) Pharmacy informed her that she would need a new prescription   Is this the correct pharmacy for this prescription? Yes If no, delete pharmacy and type the correct one.  This is the patient's preferred pharmacy:  [facility] [place], [place] - [address]  [address]  [place] [address] Phone: [phone]   Has the prescription been filled recently? Yes  Is the patient out of the medication? Yes  Has the patient been seen for an appointment in the last year OR does the patient have an upcoming appointment?   Can we respond through MyChart?   Agent: Please be advised that Rx refills may take up to 3 business days. We ask that you follow-up with your pharmacy.

## 2023-12-09 ENCOUNTER — Other Ambulatory Visit: Payer: Self-pay | Admitting: Family Medicine

## 2023-12-09 ENCOUNTER — Telehealth: Payer: Self-pay | Admitting: Family Medicine

## 2023-12-09 DIAGNOSIS — F410 Panic disorder [episodic paroxysmal anxiety] without agoraphobia: Secondary | ICD-10-CM

## 2023-12-09 MED ORDER — ALPRAZOLAM 0.5 MG PO TABS: 0.2500 mg | ORAL_TABLET | Freq: Every day | ORAL | 0 refills | Status: DC

## 2023-12-09 NOTE — Telephone Encounter (Signed)
 Copied from CRM [id number]. Topic: Clinical - Medication Refill >> Dec 09, 2023 12:36 PM Victoria A wrote: Most Recent Primary Care Visit:  Provider: CLARK, KATHERINE K  Department: [facility]  Visit Type: SAME DAY  Date: 09/25/2023  Medication: ALPRAZolam  (XANAX ) 0.5 MG tablet  Has the patient contacted their pharmacy? Yes (Agent: If no, request that the patient contact the pharmacy for the refill. If patient does not wish to contact the pharmacy document the reason why and proceed with request.) (Agent: If yes, when and what did the pharmacy advise?)  Is this the correct pharmacy for this prescription? Yes If no, delete pharmacy and type the correct one.  This is the patient's preferred pharmacy:  [id number] [place], [place] - [address] AT [employer] OF [address] [place] [address] Phone: [phone] Fax: [phone]  [id number] [address] [place] - [address] [place] [address] Phone: [phone] Fax: [phone]   Has the prescription been filled recently? No  Is the patient out of the medication? Yes  Has the patient been seen for an appointment in the last year OR does the patient have an upcoming appointment? Yes  Can we respond through MyChart? Yes  Agent: Please be advised that Rx refills may take up to 3 business days. We ask that you follow-up with your pharmacy.

## 2023-12-09 NOTE — Telephone Encounter (Signed)
Called patient and let know has been sent in. She will call if any questions.

## 2023-12-09 NOTE — Telephone Encounter (Signed)
Prescription sent today, verified received by pharmacy. Duplicate encounter.

## 2023-12-09 NOTE — Telephone Encounter (Addendum)
 Last OV: 06/10/2023 Pending OV: Nothing scheduled Medication: Alprazolam  0.5mg  Directions: Take 0.5-1 tablet QHS Last Refill: 12/09/23 Qty: #30 with 0 refills   Spoke with pt. Her prescription was sent to [facility] but it needs to be sent to [facility]. I have updated her chart to reflect this pharmacy change.

## 2023-12-09 NOTE — Addendum Note (Signed)
Addended by: Eustaquio Boyden on: 12/09/2023 02:07 PM   Modules accepted: Orders

## 2023-12-09 NOTE — Telephone Encounter (Signed)
 Copied from CRM [id number]. Topic: Clinical - Medication Refill >> Dec 09, 2023  5:06 PM Evie B wrote: Most Recent Primary Care Visit:  Provider: CLARK, KATHERINE K  Department: [facility]  Visit Type: SAME DAY  Date: 09/25/2023  Medication: ALPRAZolam  (XANAX ) 0.5 MG tablet  Has the patient contacted their pharmacy? Yes (Agent: If no, request that the patient contact the pharmacy for the refill. If patient does not wish to contact the pharmacy document the reason why and proceed with request.) (Agent: If yes, when and what did the pharmacy advise?)  Is this the correct pharmacy for this prescription? Yes If no, delete pharmacy and type the correct one.  This is the patient's preferred pharmacy:  [id number] [place], [place] - [address] AT [facility] OF [address] [place] [address] Phone: [phone] Fax: [phone]   Has the prescription been filled recently? Yes  Is the patient out of the medication? Yes  Has the patient been seen for an appointment in the last year OR does the patient have an upcoming appointment? Yes  Can we respond through MyChart? Yes  Agent: Please be advised that Rx refills may take up to 3 business days. We ask that you follow-up with your pharmacy.

## 2023-12-09 NOTE — Telephone Encounter (Signed)
 ERx

## 2023-12-12 DIAGNOSIS — K080 Exfoliation of teeth due to systemic causes: Secondary | ICD-10-CM | POA: Diagnosis not present

## 2024-01-12 ENCOUNTER — Other Ambulatory Visit: Payer: Self-pay | Admitting: Family Medicine

## 2024-01-12 DIAGNOSIS — F410 Panic disorder [episodic paroxysmal anxiety] without agoraphobia: Secondary | ICD-10-CM

## 2024-01-12 NOTE — Telephone Encounter (Signed)
 Name of Medication:  Alprazolam Name of Pharmacy:  [facility] [address] Last Lenox Ahr or Written Date and Quantity:  12/09/23, #30 Last Office Visit and Type:  06/10/23 3 mo f/u Next Office Visit and Type:  None Last Controlled Substance Agreement Date:  none Last UDS:  none

## 2024-01-13 NOTE — Telephone Encounter (Signed)
 ERx

## 2024-02-16 ENCOUNTER — Ambulatory Visit (INDEPENDENT_AMBULATORY_CARE_PROVIDER_SITE_OTHER): Admitting: Nurse Practitioner

## 2024-02-16 ENCOUNTER — Ambulatory Visit: Payer: Self-pay | Admitting: *Deleted

## 2024-02-16 ENCOUNTER — Encounter: Payer: Self-pay | Admitting: Hematology and Oncology

## 2024-02-16 VITALS — BP 120/86 | HR 76 | Temp 98.1°F | Ht 64.0 in | Wt 181.8 lb

## 2024-02-16 DIAGNOSIS — B029 Zoster without complications: Secondary | ICD-10-CM | POA: Diagnosis not present

## 2024-02-16 MED ORDER — VALACYCLOVIR HCL 1 G PO TABS: 1000.0000 mg | ORAL_TABLET | Freq: Three times a day (TID) | ORAL | 0 refills | Status: AC

## 2024-02-16 MED ORDER — LEVOCETIRIZINE DIHYDROCHLORIDE 5 MG PO TABS: 5.0000 mg | ORAL_TABLET | Freq: Every evening | ORAL | 0 refills | Status: AC

## 2024-02-16 MED ORDER — TRIAMCINOLONE ACETONIDE 0.1 % EX CREA: 1.0000 | TOPICAL_CREAM | Freq: Two times a day (BID) | CUTANEOUS | 0 refills | Status: DC

## 2024-02-16 NOTE — Patient Instructions (Signed)
 Nice to see you today I have sent medications to the pharmacy  Follow up if you do not improve Do not use the cream more than 10 days in a row

## 2024-02-16 NOTE — Telephone Encounter (Signed)
 Copied from CRM [id number]. Topic: Clinical - Red Word Triage >> Feb 16, 2024  8:01 AM Rosamond Comes wrote: Red Word that prompted transfer to Nurse Triage: patient calling in, rash started Saturday it's getting worse, itchy, red, hot to the touch, lymph node in groin starting to swell. Reason for Disposition  SEVERE itching (i.e., interferes with sleep, normal activities or school)  Answer Assessment - Initial Assessment Questions 1. APPEARANCE of RASH: "Describe the rash." (e.g., spots, blisters, raised areas, skin peeling, scaly)     Rash is red, hot and itchy.   I've had this before.   In the same place.  I've told it's a Shingles or Herpes. 2. SIZE: "How big are the spots?" (e.g., tip of pen, eraser, coin; inches, centimeters)     I have a lymph node on both sides of my groin that are swollen. 3. LOCATION: "Where is the rash located?"     It started Sat.    The rash is on lower back above the buttocks.   Lymph nodes are in the front in the groin area on both sides. 4. COLOR: "What color is the rash?" (Note: It is difficult to assess rash color in people with darker-colored skin. When this situation occurs, simply ask the caller to describe what they see.)     Red, hot to touch and hard 5. ONSET: "When did the rash begin?"     Sat. 6. FEVER: "Do you have a fever?" If Yes, ask: "What is your temperature, how was it measured, and when did it start?"     No 7. ITCHING: "Does the rash itch?" If Yes, ask: "How bad is the itch?" (Scale 1-10; or mild, moderate, severe)     Yes 8. CAUSE: "What do you think is causing the rash?"     Shingles or Herpes 9. MEDICINE FACTORS: "Have you started any new medicines within the last 2 weeks?" (e.g., antibiotics)      I've had this rash before.    I'm using Tylenol 10. OTHER SYMPTOMS: "Do you have any other symptoms?" (e.g., dizziness, headache, sore throat, joint pain)       No 11. PREGNANCY: "Is there any chance you are pregnant?" "When was your last menstrual  period?"       N/A due to age.  Protocols used: Rash or Redness - Widespread-A-AH  Chief Complaint: Rash over lower back and swollen lymph nodes in both groins. Symptoms: History of Shingles in this same area.   Looks the same Frequency: Started Sat. Pertinent Negatives: Patient denies Anything helping the itching or pain. Disposition: [] ED /[] Urgent Care (no appt availability in office) / [x] Appointment(In office/virtual)/ []  [place] [facility]/ [] Home Care/ [] Refused Recommended Disposition /[]  Mobile Bus/ []  Follow-up with PCP Additional Notes: Appt made for today with Winthrop Hawks, NP for 11:00.

## 2024-02-16 NOTE — Telephone Encounter (Signed)
Patient was seen and evaluated in office  

## 2024-02-16 NOTE — Assessment & Plan Note (Signed)
 Clinical presentation consistent with shingles.  Will treat with valacyclovir 1 g 3 times daily for 7 days.  Levocetirizine for itching.  Patient has triamcinolone cream twice daily for 7 to 10 days to also help with itching.  Did let her know that overuse of the cream can complicate healing of the lesions.  She will follow-up if no improvement or if her lymph node does not go down.

## 2024-02-16 NOTE — Progress Notes (Signed)
 Acute Office Visit  Subjective:     Patient ID: Sabrina Wright, female    DOB: 11-Jul-1961, 63 y.o.   MRN: [id number]  Chief Complaint  Patient presents with   Rash    Pt complains of rash on lower back on right side that started on Saturday. Pt has tried OTC ointment and tylenol. Nothing has helped. States its progressively gotten bigger, itchy and more painful. Pt complains of swollen lymph node on lower abdominal area.      Patient is in today for rash with a history of migraine, COPD, osteoporosis, vitamin D, b12 def, breast cancer  States that she noticed it on Saturday. State that  it has spread. It is itchy and painful with scratching. State that no discharge from the spots that she has noticed.  Patient has a history of shingles with similar presentation.  She states last time she had shingles she had a lymph node swell up, at this time. She has tried neosporin with pain relief, tylneol, and bendaryl that did not help  States that she did notice  a lymph node yesterday. States that the lymphnode hurts when she touches it.  She denies any urinary symptoms  Review of Systems  Constitutional:  Negative for chills and fever.  Respiratory:  Negative for shortness of breath.   Cardiovascular:  Negative for chest pain.  Skin:  Positive for itching and rash.  Neurological:  Negative for headaches.        Objective:    BP 120/86   Pulse 76   Temp 98.1 F (36.7 C) (Oral)   Ht 5\' 4"  (1.626 m)   Wt 181 lb 12.8 oz (82.5 kg)   LMP 02/14/2005 (Approximate)   SpO2 99%   BMI 31.21 kg/m    Physical Exam Vitals and nursing note reviewed. Exam conducted with a chaperone present Melina Copa, CMA).  Constitutional:      Appearance: Normal appearance.  Cardiovascular:     Rate and Rhythm: Normal rate and regular rhythm.     Heart sounds: Normal heart sounds.  Pulmonary:     Effort: Pulmonary effort is normal.     Breath sounds: Normal breath sounds.  Lymphadenopathy:      Lower Body: Right inguinal adenopathy present.  Skin:    Findings: Erythema and rash present.          Comments: Crusted lesions linear heading inferiorly    Neurological:     Mental Status: She is alert.     No results found for any visits on 02/16/24.      Assessment & Plan:   Problem List Items Addressed This Visit       Nervous and Auditory   Herpes zoster without complication - Primary   Clinical presentation consistent with shingles.  Will treat with valacyclovir 1 g 3 times daily for 7 days.  Levocetirizine for itching.  Patient has triamcinolone cream twice daily for 7 to 10 days to also help with itching.  Did let her know that overuse of the cream can complicate healing of the lesions.  She will follow-up if no improvement or if her lymph node does not go down.      Relevant Medications   triamcinolone cream (KENALOG) 0.1 %   valACYclovir (VALTREX) 1000 MG tablet   levocetirizine (XYZAL) 5 MG tablet    Meds ordered this encounter  Medications   triamcinolone cream (KENALOG) 0.1 %    Sig: Apply 1 Application topically 2 (two) times  daily.    Dispense:  30 g    Refill:  0    Supervising Provider:   Deri Fleet A [[id number]]   valACYclovir (VALTREX) 1000 MG tablet    Sig: Take 1 tablet (1,000 mg total) by mouth 3 (three) times daily for 7 days.    Dispense:  21 tablet    Refill:  0    Supervising Provider:   Deri Fleet A [[id number]]   levocetirizine (XYZAL) 5 MG tablet    Sig: Take 1 tablet (5 mg total) by mouth every evening.    Dispense:  30 tablet    Refill:  0    Supervising Provider:   Deri Fleet A [[id number]]    Return if symptoms worsen or fail to improve.  Margarie Shay, NP

## 2024-03-01 ENCOUNTER — Other Ambulatory Visit: Payer: Self-pay | Admitting: Hematology and Oncology

## 2024-03-01 DIAGNOSIS — F331 Major depressive disorder, recurrent, moderate: Secondary | ICD-10-CM

## 2024-03-02 ENCOUNTER — Other Ambulatory Visit: Payer: Self-pay | Admitting: Family Medicine

## 2024-03-02 DIAGNOSIS — F410 Panic disorder [episodic paroxysmal anxiety] without agoraphobia: Secondary | ICD-10-CM

## 2024-03-02 DIAGNOSIS — F331 Major depressive disorder, recurrent, moderate: Secondary | ICD-10-CM

## 2024-03-02 NOTE — Telephone Encounter (Signed)
 Copied from CRM [id number]. Topic: Clinical - Medication Refill >> Mar 02, 2024 11:41 AM Howard Macho wrote: Most Recent Primary Care Visit:  Provider: Dorothe Gaster  Department: [facility]  Visit Type: ACUTE  Date: 02/16/2024  Medication: DULoxetine  (CYMBALTA ) 30 MG capsule  Has the patient contacted their pharmacy? Yes (Agent: If no, request that the patient contact the pharmacy for the refill. If patient does not wish to contact the pharmacy document the reason why and proceed with request.) (Agent: If yes, when and what did the pharmacy advise?)  Is this the correct pharmacy for this prescription? Yes If no, delete pharmacy and type the correct one.  This is the patient's preferred pharmacy:  [id number] - [place], [place] - [address] AT [facility] OF [address] [facility] [address] [place] [address] Phone: [phone] Fax: [phone]   Has the prescription been filled recently? No  Is the patient out of the medication? Yes  Has the patient been seen for an appointment in the last year OR does the patient have an upcoming appointment? Yes  Can we respond through MyChart? Yes  Agent: Please be advised that Rx refills may take up to 3 business days. We ask that you follow-up with your pharmacy.

## 2024-03-03 ENCOUNTER — Other Ambulatory Visit: Payer: Self-pay | Admitting: Family Medicine

## 2024-03-03 DIAGNOSIS — F410 Panic disorder [episodic paroxysmal anxiety] without agoraphobia: Secondary | ICD-10-CM

## 2024-03-03 MED ORDER — ALPRAZOLAM 0.5 MG PO TABS: 0.2500 mg | ORAL_TABLET | Freq: Every day | ORAL | 0 refills | Status: DC

## 2024-03-03 NOTE — Telephone Encounter (Signed)
 Copied from CRM [id number]. Topic: Clinical - Medication Refill >> Mar 03, 2024  9:12 AM Allyne Areola wrote: Most Recent Primary Care Visit:  Provider: Dorothe Gaster  Department: [facility]  Visit Type: ACUTE  Date: 02/16/2024  Medication: ALPRAZolam  (XANAX ) 0.5 MG tablet , DULoxetine  (CYMBALTA ) 30 MG capsule  Has the patient contacted their pharmacy? Yes, asked patient to contact primary care provider.  (Agent: If no, request that the patient contact the pharmacy for the refill. If patient does not wish to contact the pharmacy document the reason why and proceed with request.) (Agent: If yes, when and what did the pharmacy advise?)  Is this the correct pharmacy for this prescription? Yes If no, delete pharmacy and type the correct one.  This is the patient's preferred pharmacy:  [id number] - [place], [place] - [address] AT [facility] OF [address] [facility] [address] [place] [address] Phone: [phone] Fax: [phone]   Has the prescription been filled recently? No  Is the patient out of the medication? Yes  Has the patient been seen for an appointment in the last year OR does the patient have an upcoming appointment? Yes  Can we respond through MyChart? Yes  Agent: Please be advised that Rx refills may take up to 3 business days. We ask that you follow-up with your pharmacy.

## 2024-03-03 NOTE — Telephone Encounter (Signed)
 Rx already sent in earlier today, #30/0 refills to [facility] [address]

## 2024-03-03 NOTE — Telephone Encounter (Signed)
 ERx

## 2024-03-03 NOTE — Telephone Encounter (Signed)
 Name of Medication:  Alprazolam  Name of Pharmacy:  [facility] [address] Last Ermelinda Hazard or Written Date and Quantity:  01/13/24, #30 Last Office Visit and Type:  06/10/23 3 mo f/u Next Office Visit and Type:  None Last Controlled Substance Agreement Date:  none Last UDS:  none

## 2024-03-04 MED ORDER — DULOXETINE HCL 30 MG PO CPEP: 30.0000 mg | ORAL_CAPSULE | Freq: Two times a day (BID) | ORAL | 0 refills | Status: DC

## 2024-03-04 NOTE — Telephone Encounter (Signed)
 E-scribed refill  Plz schedule CPE and fasting labs (no food/drink- except water and/or blk coffee 5 hrs prior) for additional refills.

## 2024-03-06 ENCOUNTER — Other Ambulatory Visit: Payer: Self-pay | Admitting: Family Medicine

## 2024-03-06 ENCOUNTER — Other Ambulatory Visit: Payer: Self-pay | Admitting: Hematology and Oncology

## 2024-03-06 DIAGNOSIS — F331 Major depressive disorder, recurrent, moderate: Secondary | ICD-10-CM

## 2024-03-08 NOTE — Telephone Encounter (Signed)
 Too soon. Rx sent 03/04/24, #180/0 refills to [facility] [address]   Request denied.

## 2024-04-12 ENCOUNTER — Other Ambulatory Visit: Payer: Self-pay | Admitting: *Deleted

## 2024-04-12 DIAGNOSIS — C50211 Malignant neoplasm of upper-inner quadrant of right female breast: Secondary | ICD-10-CM

## 2024-04-13 ENCOUNTER — Inpatient Hospital Stay: Payer: Medicaid Other | Attending: Hematology and Oncology

## 2024-04-13 ENCOUNTER — Other Ambulatory Visit: Payer: Self-pay | Admitting: Family Medicine

## 2024-04-13 ENCOUNTER — Inpatient Hospital Stay: Payer: Medicaid Other

## 2024-04-13 VITALS — BP 118/69 | HR 65 | Temp 98.3°F

## 2024-04-13 DIAGNOSIS — M810 Age-related osteoporosis without current pathological fracture: Secondary | ICD-10-CM | POA: Diagnosis not present

## 2024-04-13 DIAGNOSIS — Z853 Personal history of malignant neoplasm of breast: Secondary | ICD-10-CM | POA: Insufficient documentation

## 2024-04-13 DIAGNOSIS — C50211 Malignant neoplasm of upper-inner quadrant of right female breast: Secondary | ICD-10-CM

## 2024-04-13 DIAGNOSIS — F410 Panic disorder [episodic paroxysmal anxiety] without agoraphobia: Secondary | ICD-10-CM

## 2024-04-13 LAB — CMP (CANCER CENTER ONLY)
ALT: 13 U/L (ref 0–44)
AST: 21 U/L (ref 15–41)
Albumin: 4.2 g/dL (ref 3.5–5.0)
Alkaline Phosphatase: 77 U/L (ref 38–126)
Anion gap: 6 (ref 5–15)
BUN: 10 mg/dL (ref 8–23)
CO2: 26 mmol/L (ref 22–32)
Calcium: 9.1 mg/dL (ref 8.9–10.3)
Chloride: 104 mmol/L (ref 98–111)
Creatinine: 0.89 mg/dL (ref 0.44–1.00)
GFR, Estimated: >60 mL/min (ref >60)
Glucose, Bld: 100 mg/dL — ABNORMAL HIGH (ref 70–99)
Potassium: 4.1 mmol/L (ref 3.5–5.1)
Sodium: 136 mmol/L (ref 135–145)
Total Bilirubin: 0.3 mg/dL (ref 0.0–1.2)
Total Protein: 7.3 g/dL (ref 6.5–8.1)

## 2024-04-13 LAB — CBC WITH DIFFERENTIAL (CANCER CENTER ONLY)
Abs Immature Granulocytes: 0.04 10*3/uL (ref 0.00–0.07)
Basophils Absolute: 0.1 10*3/uL (ref 0.0–0.1)
Basophils Relative: 1 %
Eosinophils Absolute: 0.3 10*3/uL (ref 0.0–0.5)
Eosinophils Relative: 3 %
HCT: 38.2 % (ref 36.0–46.0)
Hemoglobin: 12.7 g/dL (ref 12.0–15.0)
Immature Granulocytes: 1 %
Lymphocytes Relative: 24 %
Lymphs Abs: 2.1 10*3/uL (ref 0.7–4.0)
MCH: 28.4 pg (ref 26.0–34.0)
MCHC: 33.2 g/dL (ref 30.0–36.0)
MCV: 85.5 fL (ref 80.0–100.0)
Monocytes Absolute: 0.7 10*3/uL (ref 0.1–1.0)
Monocytes Relative: 8 %
Neutro Abs: 5.6 10*3/uL (ref 1.7–7.7)
Neutrophils Relative %: 63 %
Platelet Count: 370 10*3/uL (ref 150–400)
RBC: 4.47 MIL/uL (ref 3.87–5.11)
RDW: 14.6 % (ref 11.5–15.5)
WBC Count: 8.9 10*3/uL (ref 4.0–10.5)
nRBC: 0 % (ref 0.0–0.2)

## 2024-04-13 MED ORDER — ZOLEDRONIC ACID 4 MG/100ML IV SOLN
4.0000 mg | Freq: Once | INTRAVENOUS | Status: AC
  Administered 2024-04-13: 4 mg via INTRAVENOUS
  Filled 2024-04-13: qty 100

## 2024-04-13 NOTE — Telephone Encounter (Signed)
 Name of Medication:  Alprazolam  Name of Pharmacy:  [facility] [address] Last Ermelinda Hazard or Written Date and Quantity:  03/03/24, #30 Last Office Visit and Type:  06/10/23, 3 mo mood f/u Next Office Visit and Type:  05/19/24, CPE Last Controlled Substance Agreement Date:  none Last UDS:  none

## 2024-04-13 NOTE — Patient Instructions (Signed)
 Zoledronic Acid Injection  What is this medication? ZOLEDRONIC ACID (ZOE le dron ik AS id) treats high calcium levels in the blood caused by cancer. It may also be used with chemotherapy to treat weakened bones caused by cancer. It works by slowing down the release of calcium from bones. This lowers calcium levels in your blood. It also makes your bones stronger and less likely to break (fracture). It belongs to a group of medications called bisphosphonates. This medicine may be used for other purposes; ask your health care provider or pharmacist if you have questions. COMMON BRAND NAME(S): Zometa, Zometa Powder What should I tell my care team before I take this medication? They need to know if you have any of these conditions: Dehydration Dental disease Kidney disease Liver disease Low levels of calcium in the blood Lung or breathing disease, such as asthma Receiving steroids, such as dexamethasone or prednisone An unusual or allergic reaction to zoledronic acid, other medications, foods, dyes, or preservatives Pregnant or trying to get pregnant Breast-feeding How should I use this medication? This medication is injected into a vein. It is given by your care team in a hospital or clinic setting. Talk to your care team about the use of this medication in children. Special care may be needed. Overdosage: If you think you have taken too much of this medicine contact a poison control center or emergency room at once. NOTE: This medicine is only for you. Do not share this medicine with others. What if I miss a dose? Keep appointments for follow-up doses. It is important not to miss your dose. Call your care team if you are unable to keep an appointment. What may interact with this medication? Certain antibiotics given by injection Diuretics, such as bumetanide, furosemide NSAIDs, medications for pain and inflammation, such as ibuprofen or naproxen Teriparatide Thalidomide This list may not  describe all possible interactions. Give your health care provider a list of all the medicines, herbs, non-prescription drugs, or dietary supplements you use. Also tell them if you smoke, drink alcohol, or use illegal drugs. Some items may interact with your medicine. What should I watch for while using this medication? Visit your care team for regular checks on your progress. It may be some time before you see the benefit from this medication. Some people who take this medication have severe bone, joint, or muscle pain. This medication may also increase your risk for jaw problems or a broken thigh bone. Tell your care team right away if you have severe pain in your jaw, bones, joints, or muscles. Tell you care team if you have any pain that does not go away or that gets worse. Tell your dentist and dental surgeon that you are taking this medication. You should not have major dental surgery while on this medication. See your dentist to have a dental exam and fix any dental problems before starting this medication. Take good care of your teeth while on this medication. Make sure you see your dentist for regular follow-up appointments. You should make sure you get enough calcium and vitamin D while you are taking this medication. Discuss the foods you eat and the vitamins you take with your care team. Check with your care team if you have severe diarrhea, nausea, and vomiting, or if you sweat a lot. The loss of too much body fluid may make it dangerous for you to take this medication. You may need bloodwork while taking this medication. Talk to your care team if  you wish to become pregnant or think you might be pregnant. This medication can cause serious birth defects. What side effects may I notice from receiving this medication? Side effects that you should report to your care team as soon as possible: Allergic reactions--skin rash, itching, hives, swelling of the face, lips, tongue, or throat Kidney  injury--decrease in the amount of urine, swelling of the ankles, hands, or feet Low calcium level--muscle pain or cramps, confusion, tingling, or numbness in the hands or feet Osteonecrosis of the jaw--pain, swelling, or redness in the mouth, numbness of the jaw, poor healing after dental work, unusual discharge from the mouth, visible bones in the mouth Severe bone, joint, or muscle pain Side effects that usually do not require medical attention (report to your care team if they continue or are bothersome): Constipation Fatigue Fever Loss of appetite Nausea Stomach pain This list may not describe all possible side effects. Call your doctor for medical advice about side effects. You may report side effects to FDA at 1-800-FDA-1088. Where should I keep my medication? This medication is given in a hospital or clinic. It will not be stored at home. NOTE: This sheet is a summary. It may not cover all possible information. If you have questions about this medicine, talk to your doctor, pharmacist, or health care provider.  2024 Elsevier/Gold Standard (2021-12-14 00:00:00)

## 2024-04-14 NOTE — Telephone Encounter (Signed)
 ERx

## 2024-04-19 ENCOUNTER — Other Ambulatory Visit: Payer: Self-pay | Admitting: Hematology and Oncology

## 2024-04-19 DIAGNOSIS — Z1231 Encounter for screening mammogram for malignant neoplasm of breast: Secondary | ICD-10-CM

## 2024-05-02 IMAGING — CT CT CHEST LUNG CANCER SCREENING LOW DOSE W/O CM
1 series · 10 of 10 positions shown, 13 images · non-contrast
Comparison: 12/05/2020 calcium score CT.  CTA chest 03/10/2008.

CLINICAL DATA: Forty pack-year smoking history/current smoker.
History of breast cancer.



[ct lung segmentation data · axial · 0.74mm/px · z∈[-352,-352]mm · 10 of 299 frames shown]
[frame 1/299  mediastinal]
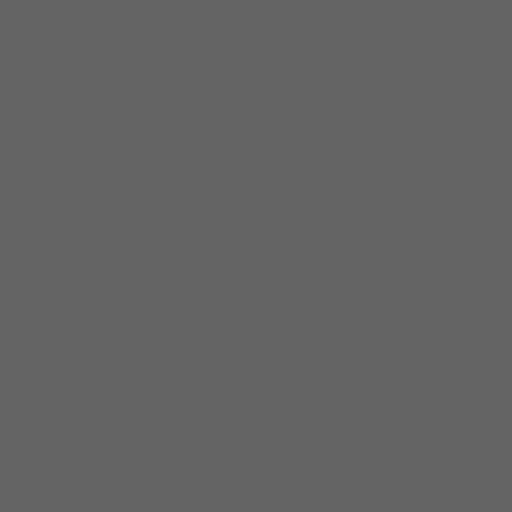
[frame 1/299  lung]
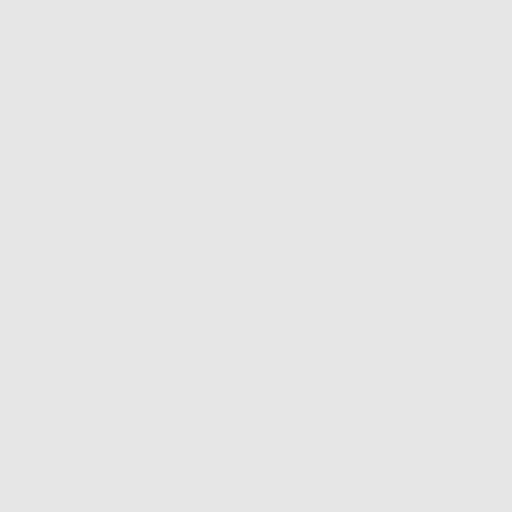
[frame 34/299  lung]
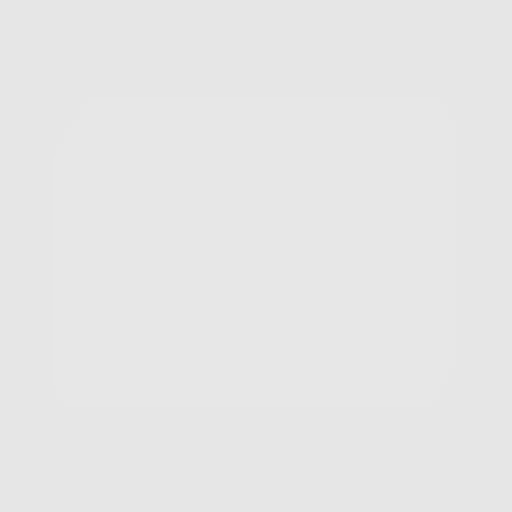
[frame 67/299  lung]
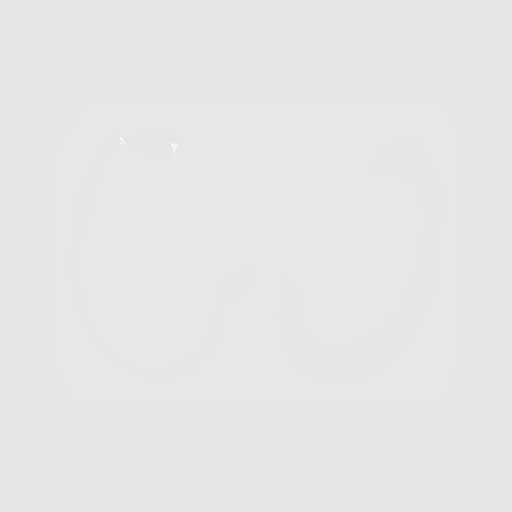
[frame 100/299  lung]
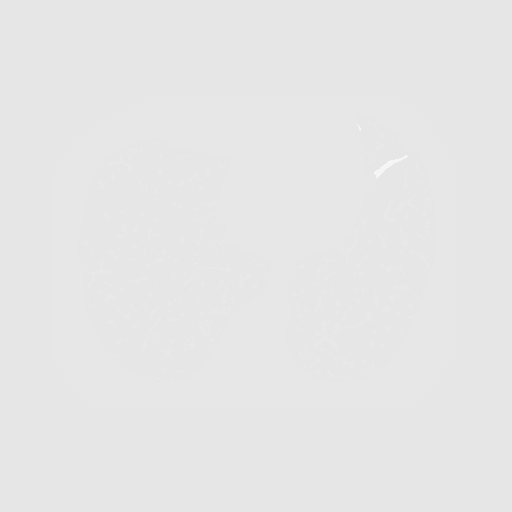
[frame 133/299  mediastinal]
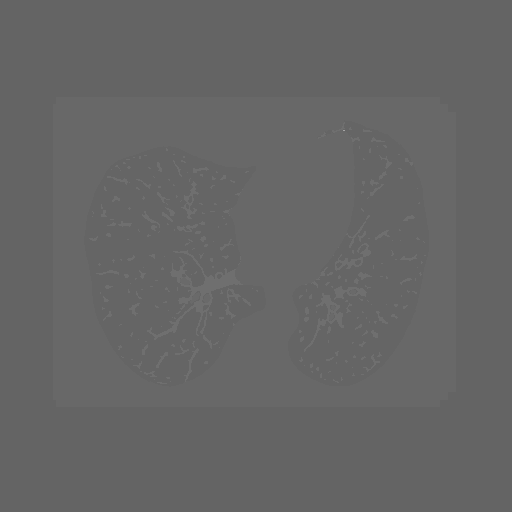
[frame 133/299  lung]
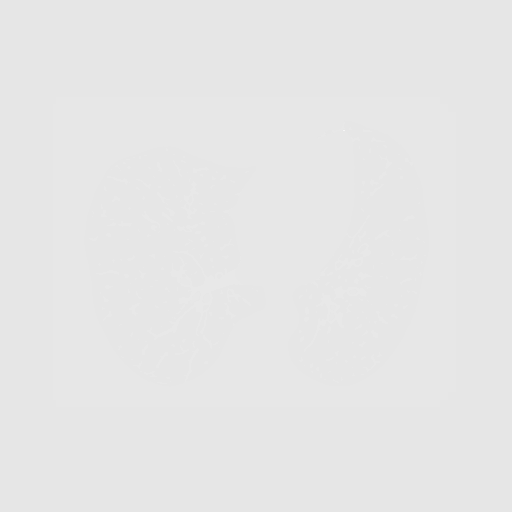
[frame 166/299  lung]
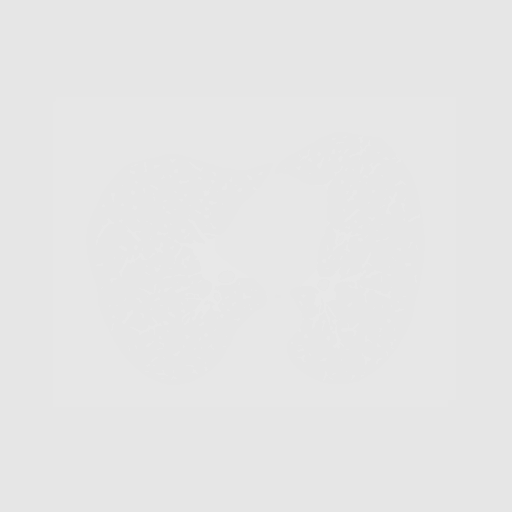
[frame 199/299  lung]
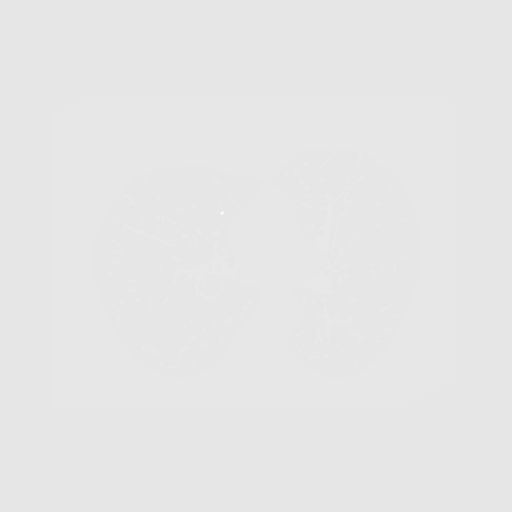
[frame 232/299  lung]
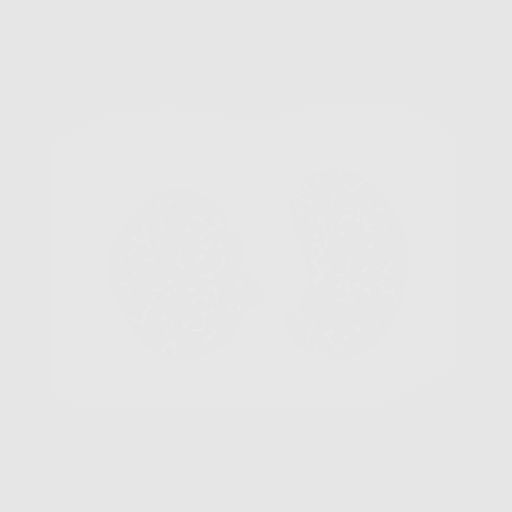
[frame 265/299  mediastinal]
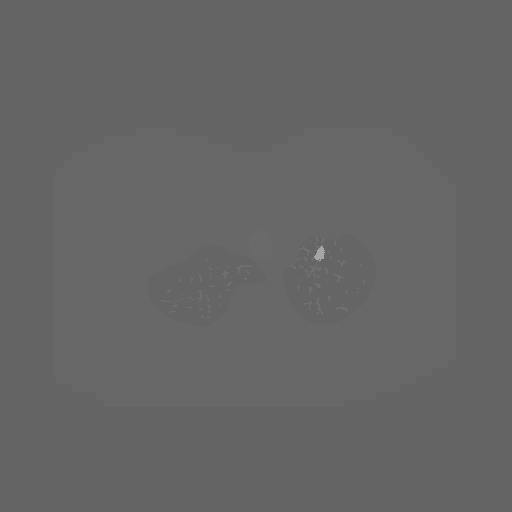
[frame 265/299  lung]
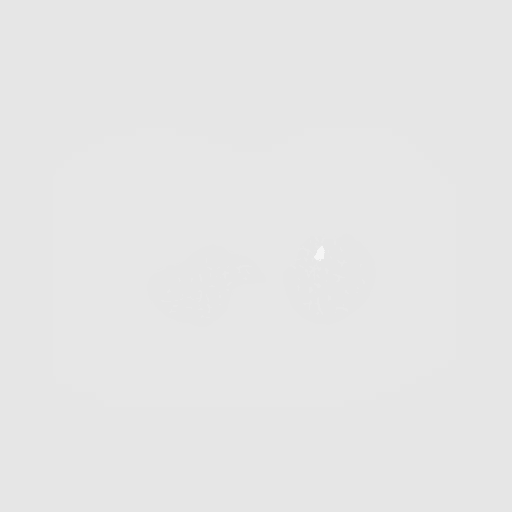
[frame 299/299  lung]
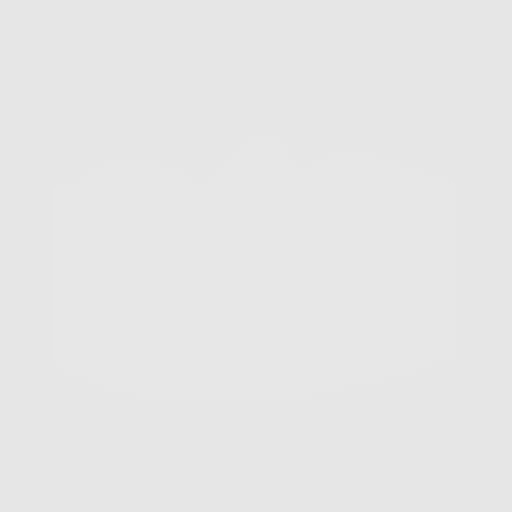

[10 of 10 positions shown; findings below may reference images not displayed]

FINDINGS: Cardiovascular: Right Port-A-Cath tip low SVC. Normal heart size,
without pericardial effusion. Aortic atherosclerosis.

Mediastinum/Nodes: Bilateral axillary node dissection. No axillary
or subpectoral adenopathy. No mediastinal or definite hilar
adenopathy, given limitations of unenhanced CT. No internal mammary
adenopathy.

Lungs/Pleura: No pleural fluid. Mild centrilobular emphysema. Mild
left apical pleural based scarring. Left-sided pulmonary nodules of
maximally volume derived equivalent diameter 4.0 mm.

Upper Abdomen: Normal imaged portions of the liver, spleen, stomach,
pancreas, gallbladder, adrenal glands, kidneys.

Musculoskeletal: Presumably radiation induced right breast skin
thickening. Medial left breast lumpectomy changes. No acute osseous
abnormality. Mild pectus excavatum deformity.
IMPRESSION: Lung-RADS 2, benign appearance or behavior. Continue annual
screening with low-dose chest CT without contrast in 12 months.

Aortic Atherosclerosis (5H7Q8-8FO.O) and Emphysema (5H7Q8-54P.M).

## 2024-05-10 ENCOUNTER — Other Ambulatory Visit: Payer: Self-pay | Admitting: Family Medicine

## 2024-05-10 DIAGNOSIS — E785 Hyperlipidemia, unspecified: Secondary | ICD-10-CM | POA: Insufficient documentation

## 2024-05-10 DIAGNOSIS — E538 Deficiency of other specified B group vitamins: Secondary | ICD-10-CM

## 2024-05-10 DIAGNOSIS — E559 Vitamin D deficiency, unspecified: Secondary | ICD-10-CM

## 2024-05-12 ENCOUNTER — Other Ambulatory Visit (INDEPENDENT_AMBULATORY_CARE_PROVIDER_SITE_OTHER)

## 2024-05-12 DIAGNOSIS — E559 Vitamin D deficiency, unspecified: Secondary | ICD-10-CM

## 2024-05-12 DIAGNOSIS — E538 Deficiency of other specified B group vitamins: Secondary | ICD-10-CM

## 2024-05-12 DIAGNOSIS — E785 Hyperlipidemia, unspecified: Secondary | ICD-10-CM

## 2024-05-12 LAB — LIPID PANEL
Cholesterol: 220 mg/dL — ABNORMAL HIGH (ref 0–200)
HDL: 63.4 mg/dL (ref >39.00)
LDL Cholesterol: 131 mg/dL — ABNORMAL HIGH (ref 0–99)
NonHDL: 156.23
Total CHOL/HDL Ratio: 3
Triglycerides: 127 mg/dL (ref 0.0–149.0)
VLDL: 25.4 mg/dL (ref 0.0–40.0)

## 2024-05-12 LAB — VITAMIN D 25 HYDROXY (VIT D DEFICIENCY, FRACTURES): VITD: 23.43 ng/mL — ABNORMAL LOW (ref 30.00–100.00)

## 2024-05-12 LAB — VITAMIN B12: Vitamin B-12: 443 pg/mL (ref 211–911)

## 2024-05-13 ENCOUNTER — Ambulatory Visit: Payer: Self-pay | Admitting: Family Medicine

## 2024-05-19 ENCOUNTER — Encounter: Payer: Self-pay | Admitting: Family Medicine

## 2024-05-19 ENCOUNTER — Ambulatory Visit (INDEPENDENT_AMBULATORY_CARE_PROVIDER_SITE_OTHER): Admitting: Family Medicine

## 2024-05-19 VITALS — BP 114/82 | HR 79 | Temp 98.3°F | Ht 63.0 in | Wt 183.4 lb

## 2024-05-19 DIAGNOSIS — E559 Vitamin D deficiency, unspecified: Secondary | ICD-10-CM

## 2024-05-19 DIAGNOSIS — F410 Panic disorder [episodic paroxysmal anxiety] without agoraphobia: Secondary | ICD-10-CM | POA: Diagnosis not present

## 2024-05-19 DIAGNOSIS — Z87891 Personal history of nicotine dependence: Secondary | ICD-10-CM

## 2024-05-19 DIAGNOSIS — M810 Age-related osteoporosis without current pathological fracture: Secondary | ICD-10-CM

## 2024-05-19 DIAGNOSIS — M792 Neuralgia and neuritis, unspecified: Secondary | ICD-10-CM

## 2024-05-19 DIAGNOSIS — Z7189 Other specified counseling: Secondary | ICD-10-CM

## 2024-05-19 DIAGNOSIS — F331 Major depressive disorder, recurrent, moderate: Secondary | ICD-10-CM | POA: Diagnosis not present

## 2024-05-19 DIAGNOSIS — Z0000 Encounter for general adult medical examination without abnormal findings: Secondary | ICD-10-CM | POA: Insufficient documentation

## 2024-05-19 DIAGNOSIS — E7800 Pure hypercholesterolemia, unspecified: Secondary | ICD-10-CM

## 2024-05-19 DIAGNOSIS — I447 Left bundle-branch block, unspecified: Secondary | ICD-10-CM

## 2024-05-19 DIAGNOSIS — G43109 Migraine with aura, not intractable, without status migrainosus: Secondary | ICD-10-CM

## 2024-05-19 DIAGNOSIS — Z171 Estrogen receptor negative status [ER-]: Secondary | ICD-10-CM

## 2024-05-19 DIAGNOSIS — J449 Chronic obstructive pulmonary disease, unspecified: Secondary | ICD-10-CM

## 2024-05-19 DIAGNOSIS — E538 Deficiency of other specified B group vitamins: Secondary | ICD-10-CM

## 2024-05-19 MED ORDER — ALPRAZOLAM 0.5 MG PO TABS
0.5000 mg | ORAL_TABLET | Freq: Two times a day (BID) | ORAL | 3 refills | Status: AC | PRN
Start: 2024-05-19 — End: ?

## 2024-05-19 MED ORDER — SUMATRIPTAN SUCCINATE 100 MG PO TABS: ORAL_TABLET | ORAL | 5 refills | Status: DC

## 2024-05-19 MED ORDER — NORTRIPTYLINE HCL 50 MG PO CAPS: 50.0000 mg | ORAL_CAPSULE | Freq: Every day | ORAL | 3 refills | Status: AC

## 2024-05-19 MED ORDER — DULOXETINE HCL 30 MG PO CPEP: 30.0000 mg | ORAL_CAPSULE | Freq: Two times a day (BID) | ORAL | 3 refills | Status: AC

## 2024-05-19 MED ORDER — GABAPENTIN 300 MG PO CAPS: ORAL_CAPSULE | ORAL | 3 refills | Status: DC

## 2024-05-19 MED ORDER — NORTRIPTYLINE HCL 50 MG PO CAPS: 50.0000 mg | ORAL_CAPSULE | Freq: Every day | ORAL | 3 refills | Status: DC

## 2024-05-19 MED ORDER — SUMATRIPTAN SUCCINATE 100 MG PO TABS
ORAL_TABLET | ORAL | 5 refills | Status: AC
Start: 2024-05-19 — End: ?

## 2024-05-19 NOTE — Assessment & Plan Note (Signed)
 She would be interested in repeat lung cancer screening - # provided to call and reschedule appt

## 2024-05-19 NOTE — Assessment & Plan Note (Signed)
 Levels remain low despite regular 2000 international unit daily replacement. Rec increase to 4000-5000 units daily, reassess at f/u visit .

## 2024-05-19 NOTE — Assessment & Plan Note (Signed)
 Advanced directive - unsure if she has this. Husband is HCPOA. Packet provided today.
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
## 2024-05-19 NOTE — Assessment & Plan Note (Signed)
Stable period off respiratory medication. 

## 2024-05-19 NOTE — Assessment & Plan Note (Signed)
 Stable on 1000 mcg daily replacement - continue

## 2024-05-19 NOTE — Assessment & Plan Note (Signed)
 Chronic, continue cymbatla and nortiptyline.

## 2024-05-19 NOTE — Assessment & Plan Note (Signed)
 Chronic, again noted on EKG today

## 2024-05-19 NOTE — Assessment & Plan Note (Deleted)
 Appreciate oncology care.

## 2024-05-19 NOTE — Assessment & Plan Note (Signed)
 Continue PRN xanax  - takes predominantly at night time.

## 2024-05-19 NOTE — Assessment & Plan Note (Signed)
 Chronic issue, managing with cymbalta , nortriptyline , gabapetin. Increase gabapentin  to 300/300/600mg  daily.  RTC 3 mo f/u

## 2024-05-19 NOTE — Assessment & Plan Note (Signed)
 Imitrex refilled.

## 2024-05-19 NOTE — Assessment & Plan Note (Deleted)

## 2024-05-19 NOTE — Patient Instructions (Addendum)
 Increase gabapentin  to 300mg  twice daily and 600mg  at night.  Lung cancer screening program phone number is [phone] - call to schedule repeat CT scan at your convenience.  Increase vitamin D3 to 4000-5000 units daily  Advanced directive packet provided today.  I have placed referral to audiology in [place] for L>R hearing loss  Return in 3 months for follow up visit

## 2024-05-19 NOTE — Assessment & Plan Note (Addendum)
 Now on Zometa  IV infusions, receiving Q6 months through oncology latest 04/2024

## 2024-05-19 NOTE — Assessment & Plan Note (Signed)
 Reviewed chol control, encouraged diet choices to improve levels. Low ASCVD risk but does have fmhx - encouraged goal LDL <130, ideally <100 The 10-year ASCVD risk score (Arnett DK, et al., 2019) is: 3.6%   Values used to calculate the score:     Age: 63 years     Clincally relevant sex: Female     Is Non-Hispanic African American: No     Diabetic: No     Tobacco smoker: No     Systolic Blood Pressure: 114 mmHg     Is BP treated: No     HDL Cholesterol: 63.4 mg/dL     Total Cholesterol: 220 mg/dL

## 2024-05-20 ENCOUNTER — Encounter: Payer: Self-pay | Admitting: Family Medicine

## 2024-06-02 ENCOUNTER — Other Ambulatory Visit: Payer: Self-pay

## 2024-06-02 ENCOUNTER — Ambulatory Visit
Admission: RE | Admit: 2024-06-02 | Discharge: 2024-06-02 | Disposition: A | Discharge status: Home / Self Care | Source: Ambulatory Visit | Attending: Hematology and Oncology | Admitting: Hematology and Oncology

## 2024-06-02 DIAGNOSIS — F17210 Nicotine dependence, cigarettes, uncomplicated: Secondary | ICD-10-CM

## 2024-06-02 DIAGNOSIS — Z1231 Encounter for screening mammogram for malignant neoplasm of breast: Secondary | ICD-10-CM

## 2024-06-02 DIAGNOSIS — Z87891 Personal history of nicotine dependence: Secondary | ICD-10-CM

## 2024-06-02 DIAGNOSIS — Z122 Encounter for screening for malignant neoplasm of respiratory organs: Secondary | ICD-10-CM

## 2024-06-18 ENCOUNTER — Ambulatory Visit (HOSPITAL_COMMUNITY)
Admission: RE | Admit: 2024-06-18 | Discharge: 2024-06-18 | Disposition: A | Discharge status: Home / Self Care | Source: Ambulatory Visit | Attending: Acute Care | Admitting: Acute Care

## 2024-06-18 DIAGNOSIS — Z87891 Personal history of nicotine dependence: Secondary | ICD-10-CM | POA: Insufficient documentation

## 2024-06-18 DIAGNOSIS — Z122 Encounter for screening for malignant neoplasm of respiratory organs: Secondary | ICD-10-CM | POA: Insufficient documentation

## 2024-06-18 DIAGNOSIS — F17210 Nicotine dependence, cigarettes, uncomplicated: Secondary | ICD-10-CM | POA: Diagnosis not present

## 2024-06-29 ENCOUNTER — Encounter: Payer: Self-pay | Admitting: Family Medicine

## 2024-07-06 ENCOUNTER — Other Ambulatory Visit: Payer: Self-pay | Admitting: Acute Care

## 2024-07-06 ENCOUNTER — Encounter: Payer: Self-pay | Admitting: Acute Care

## 2024-07-06 DIAGNOSIS — Z122 Encounter for screening for malignant neoplasm of respiratory organs: Secondary | ICD-10-CM

## 2024-07-06 DIAGNOSIS — F17210 Nicotine dependence, cigarettes, uncomplicated: Secondary | ICD-10-CM

## 2024-07-06 DIAGNOSIS — Z87891 Personal history of nicotine dependence: Secondary | ICD-10-CM

## 2024-08-03 ENCOUNTER — Ambulatory Visit: Payer: Self-pay

## 2024-08-03 NOTE — Telephone Encounter (Signed)
 Appreciate Dr Randeen seeing this pleasant patient tomorrow.

## 2024-08-03 NOTE — Telephone Encounter (Signed)
 FYI Only or Action Required?: FYI only for provider.  Patient was last seen in primary care on 05/19/2024 by Rilla Baller, MD.  Called Nurse Triage reporting Rash.  Symptoms began yesterday.  Symptoms are: gradually worsening.  Triage Disposition: See Physician Within 24 Hours  Patient/caregiver understands and will follow disposition?: Yes     Copied from CRM #[id number]. Topic: Clinical - Red Word Triage >> Aug 03, 2024  4:25 PM Frederich PARAS wrote: [facility] that prompted transfer to Nurse Triage: itchy,pain,burning  pt says she is breaking out with shingles,pt is very itchy, on right hip above her buttocks. PT says it is burning and pain she also says the bumps are bigger      Reason for Disposition  [1] Localized rash is very painful AND [2] no fever  Answer Assessment - Initial Assessment Questions 1. APPEARANCE of RASH: What does the rash look like? (e.g., blisters, dry flaky skin, red spots, redness, sores)     Unable to see 2. LOCATION: Where is the rash located?      Right hip above buttock 3. NUMBER: How many spots are there?      Unable to see 4. SIZE: How big are the spots? (e.g., inches, cm; or compare to size of pinhead, tip of pen, eraser, pea)      Unable to see  5. ONSET: When did the rash start?      Yesterday  6. ITCHING: Does the rash itch? If Yes, ask: How bad is the itch?  (Scale 0-10; or none, mild, moderate, severe)     Moderate to severe  7. PAIN: Does the rash hurt? If Yes, ask: How bad is the pain?  (Scale 0-10; or none, mild, moderate, severe)     Moderate to severe  8. OTHER SYMPTOMS: Do you have any other symptoms? (e.g., fever)     No  Protocols used: Rash or Redness - Localized-A-AH

## 2024-08-04 ENCOUNTER — Ambulatory Visit (INDEPENDENT_AMBULATORY_CARE_PROVIDER_SITE_OTHER): Admitting: Family Medicine

## 2024-08-04 ENCOUNTER — Encounter: Payer: Self-pay | Admitting: Family Medicine

## 2024-08-04 VITALS — BP 142/82 | HR 74 | Temp 98.6°F | Ht 63.0 in | Wt 184.5 lb

## 2024-08-04 DIAGNOSIS — B029 Zoster without complications: Secondary | ICD-10-CM

## 2024-08-04 MED ORDER — VALACYCLOVIR HCL 1 G PO TABS: 1000.0000 mg | ORAL_TABLET | Freq: Three times a day (TID) | ORAL | 0 refills | Status: AC

## 2024-08-04 MED ORDER — TRIAMCINOLONE ACETONIDE 0.1 % EX CREA: 1.0000 | TOPICAL_CREAM | Freq: Two times a day (BID) | CUTANEOUS | 0 refills | Status: AC

## 2024-08-04 NOTE — Patient Instructions (Addendum)
 Keep rash clean and dry (soap and water)  Avoid friction in the area  Take the valtrex  as directed  Use the triamcinolone  cream sparingly to itchy area up to twice daily   A cool compress may help also   An antihistamine like benadryl  or zyrtec may help itching also   Update if not starting to improve in a week or if worsening
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
## 2024-08-04 NOTE — Assessment & Plan Note (Signed)
 Right buttocks , approx S1 dermatome, rash in small area but pain goes down leg, with tender LN in right groin  This has been recurrent despite having the shingrix vaccines  ? Of possible herpes simplex vs zoster given recurrence  (? If that would be less likely to cause dermatomal pain however)   Treatment  Valtrex  1 g tid 7 d Triamcinolone  cream 0.1% bid prn itch (limited/ with caution so not to prolong healing)  Antihistamine prn Cool compress prn   Update if not starting to improve in a week or if worsening  Call back and Er precautions noted in detail today

## 2024-08-20 ENCOUNTER — Ambulatory Visit: Admitting: Family Medicine

## 2024-08-20 ENCOUNTER — Encounter: Payer: Self-pay | Admitting: Family Medicine

## 2024-08-20 VITALS — BP 112/80 | HR 82 | Temp 97.8°F | Ht 63.0 in | Wt 188.0 lb

## 2024-08-20 DIAGNOSIS — R55 Syncope and collapse: Secondary | ICD-10-CM

## 2024-08-20 DIAGNOSIS — F410 Panic disorder [episodic paroxysmal anxiety] without agoraphobia: Secondary | ICD-10-CM | POA: Diagnosis not present

## 2024-08-20 DIAGNOSIS — R42 Dizziness and giddiness: Secondary | ICD-10-CM

## 2024-08-20 DIAGNOSIS — Z23 Encounter for immunization: Secondary | ICD-10-CM

## 2024-08-20 DIAGNOSIS — H9193 Unspecified hearing loss, bilateral: Secondary | ICD-10-CM | POA: Diagnosis not present

## 2024-08-20 DIAGNOSIS — H9192 Unspecified hearing loss, left ear: Secondary | ICD-10-CM

## 2024-08-20 MED ORDER — ALPRAZOLAM 0.5 MG PO TABS: 0.5000 mg | ORAL_TABLET | Freq: Two times a day (BID) | ORAL | 3 refills | Status: AC | PRN

## 2024-08-20 NOTE — Progress Notes (Unsigned)
 Ph: [phone] Fax: [phone]   Patient ID: Sabrina Wright, female    DOB: September 22, 1961, 63 y.o.   MRN: [id number]  This visit was conducted in person.  BP 112/80 (BP Location: Left Arm, Patient Position: Standing, Cuff Size: Normal)   Pulse 82   Temp 97.8 F (36.6 C) (Oral)   Ht 5' 3 (1.6 m)   Wt 188 lb (85.3 kg)   LMP 02/14/2005 (Approximate)   SpO2 96%   BMI 33.30 kg/m   BP Readings from Last 3 Encounters:  08/20/24 112/80  08/04/24 (!) 142/82  05/19/24 114/82    Pulse Readings from Last 3 Encounters:  08/20/24 82  08/04/24 74  05/19/24 79    Orthostatic Vitals for the past 48 hrs (Last 6 readings):  Patient Position Orthostatic BP BP Pulse BP Location BP Method Cuff Size Patient Position (if appropriate)  08/20/24 1112 -- -- 90/60 82 -- -- -- --  08/20/24 1146 -- -- 120/72 -- Right Arm Manual Normal Sitting  08/20/24 1158 Supine 124/84 -- -- Left Arm -- Normal --  08/20/24 1200 Standing 112/82 -- -- Left Arm -- Normal --  08/20/24 1202 Standing -- 112/80 -- Left Arm -- Normal --    Hearing Screening   500Hz  2000Hz   Right ear 40 40  Left ear       CC: 3 month follow up visit  Subjective:   HPI: Sabrina Wright is a 63 y.o. female presenting on 08/20/2024 for Medical Management of Chronic Issues (Pt is here for a 3 mo . Pt states she had 2 dizzy spells.)   Chronic neuropathic pain of left arm - lyrica  was helpful but unaffordable. Gabapentin  ineffective. Now on nortriptyline  50mg  nightly -   Seen earlier this month with ?recurrent shingles to R hip - treated with second valtrex  1gm TID 7d course and topical triamcinolone  cream. Previous shingles diagnosis 02/2024. This has fully resolved.   New dizziness - episode 2 weeks ago walking in kitchen felt ear fullness/pressure bilaterally, decreased hearing she then fell down - doesn't remember hitting the floor. No injury. + ringing in ears. This may have occurred while bending over to get dog food out of  container.  Previous similar episode 3-4 wks ago. She thinks she did black out, lasted a few seconds and she was awake and not post-ictal. No seizure activity.  Notes left sided headache several times a week described as migraine (photosensitivity, phonophobia, activity limiting, but no nausea/vomiting) No vision changes, no unilateral paresthesias numbness or weakness, no slurred speech or drooping.   She requests xanax  refilled.      Relevant past medical, surgical, family and social history reviewed and updated as indicated. Interim medical history since our last visit reviewed. Allergies and medications reviewed and updated. Outpatient Medications Prior to Visit  Medication Sig Dispense Refill   acetaminophen  (TYLENOL ) 325 MG tablet Take 2 tablets (650 mg total) by mouth every 6 (six) hours as needed for fever, headache or moderate pain.     Calcium  Carb-Cholecalciferol  (CALCIUM  600/VITAMIN D3) 600-800 MG-UNIT TABS Take 2 tablets by mouth daily. 60 tablet    Cholecalciferol  (VITAMIN D ) 50 MCG (2000 UT) CAPS Take 1 capsule (2,000 Units total) by mouth daily. 30 capsule    cyanocobalamin  (V-R VITAMIN B-12) 500 MCG tablet Take 2 tablets (1,000 mcg total) by mouth daily.     DULoxetine  (CYMBALTA ) 30 MG capsule Take 1 capsule (30 mg total) by mouth 2 (two) times daily. 180 capsule 3  gabapentin  (NEURONTIN ) 300 MG capsule Take 1 capsule (300 mg total) by mouth 2 (two) times daily AND 2 capsules (600 mg total) at bedtime. 360 capsule 3   levocetirizine (XYZAL ) 5 MG tablet Take 1 tablet (5 mg total) by mouth every evening. 30 tablet 0   Multiple Vitamin (MULTIVITAMIN WITH MINERALS) TABS tablet Take 1 tablet by mouth at bedtime.     nortriptyline  (PAMELOR ) 50 MG capsule Take 1 capsule (50 mg total) by mouth at bedtime. 90 capsule 3   SUMAtriptan  (IMITREX ) 100 MG tablet TAKE 1 TABLET BY MOUTH ONCE FOR 1 DOSE. MAY REPEAT IN 2 HOURS IF HEADACHE PERSISTS OR RECURS. 9 tablet 5   triamcinolone  cream  (KENALOG ) 0.1 % Apply 1 Application topically 2 (two) times daily. To affected itchy area 30 g 0   valACYclovir  (VALTREX ) 1000 MG tablet Take 1 tablet (1,000 mg total) by mouth 3 (three) times daily. 21 tablet 0   ALPRAZolam  (XANAX ) 0.5 MG tablet Take 1 tablet (0.5 mg total) by mouth 2 (two) times daily as needed for anxiety or sleep. 30 tablet 3   No facility-administered medications prior to visit.     Per HPI unless specifically indicated in ROS section below Review of Systems  Objective:  BP 112/80 (BP Location: Left Arm, Patient Position: Standing, Cuff Size: Normal)   Pulse 82   Temp 97.8 F (36.6 C) (Oral)   Ht 5' 3 (1.6 m)   Wt 188 lb (85.3 kg)   LMP 02/14/2005 (Approximate)   SpO2 96%   BMI 33.30 kg/m   Wt Readings from Last 3 Encounters:  08/20/24 188 lb (85.3 kg)  08/04/24 184 lb 8 oz (83.7 kg)  05/19/24 183 lb 6 oz (83.2 kg)      Physical Exam Vitals and nursing note reviewed.  Constitutional:      Appearance: Normal appearance. She is not ill-appearing.  HENT:     Mouth/Throat:     Mouth: Mucous membranes are moist.     Pharynx: Oropharynx is clear. No oropharyngeal exudate or posterior oropharyngeal erythema.  Eyes:     Extraocular Movements: Extraocular movements intact.     Conjunctiva/sclera: Conjunctivae normal.     Pupils: Pupils are equal, round, and reactive to light.  Neck:     Vascular: No carotid bruit.  Cardiovascular:     Rate and Rhythm: Normal rate and regular rhythm.     Pulses: Normal pulses.     Heart sounds: Normal heart sounds. No murmur heard. Pulmonary:     Effort: Pulmonary effort is normal. No respiratory distress.     Breath sounds: Normal breath sounds. No wheezing, rhonchi or rales.  Musculoskeletal:     Right lower leg: No edema.     Left lower leg: No edema.  Skin:    General: Skin is warm and dry.     Findings: No rash.  Neurological:     Mental Status: She is alert.     Comments:  CN 2-12 inact FTN intact EOMI   No ataxia, neg romberg   Psychiatric:        Mood and Affect: Mood normal.        Behavior: Behavior normal.       Results for orders placed or performed in visit on 05/12/24  VITAMIN D  25 Hydroxy (Vit-D Deficiency, Fractures)   Collection Time: 05/12/24  8:26 AM  Result Value Ref Range   VITD 23.43 (L) 30.00 - 100.00 ng/mL  Vitamin B12   Collection Time:  05/12/24  8:26 AM  Result Value Ref Range   Vitamin B-12 443 211 - 911 pg/mL  Lipid panel   Collection Time: 05/12/24  8:26 AM  Result Value Ref Range   Cholesterol 220 (H) 0 - 200 mg/dL   Triglycerides 872.9 0.0 - 149.0 mg/dL   HDL 36.59 >60.99 mg/dL   VLDL 74.5 0.0 - 59.9 mg/dL   LDL Cholesterol 868 (H) 0 - 99 mg/dL   Total CHOL/HDL Ratio 3    NonHDL 156.23       08/20/2024   11:18 AM 08/04/2024    8:00 AM 05/19/2024   12:03 PM 02/16/2024   11:18 AM 09/25/2023    2:56 PM  Depression screen PHQ 2/9  Decreased Interest 1 2 1 1 1   Down, Depressed, Hopeless 1 1 1 1 1   PHQ - 2 Score 2 3 2 2 2   Altered sleeping 3 3 2 2 1   Tired, decreased energy 2 1 2 2 3   Change in appetite 0 1 1 0 0  Feeling bad or failure about yourself  2 1 1 1 1   Trouble concentrating 1 1 1 1 1   Moving slowly or fidgety/restless 0 1 0 0 0  Suicidal thoughts 1 1 0 0 1  PHQ-9 Score 11 12 9 8 9   Difficult doing work/chores Somewhat difficult Somewhat difficult Somewhat difficult Somewhat difficult Somewhat difficult       08/20/2024   11:18 AM 08/04/2024    8:01 AM 05/19/2024   12:03 PM 02/16/2024   11:18 AM  GAD 7 : Generalized Anxiety Score  Nervous, Anxious, on Edge 1 1 0 1  Control/stop worrying 1 1 1 1   Worry too much - different things 1 1 1 1   Trouble relaxing 2 1 1 1   Restless 1 1 0 1  Easily annoyed or irritable 2 2 1 1   Afraid - awful might happen 1 1 1  0  Total GAD 7 Score 9 8 5 6   Anxiety Difficulty Somewhat difficult Somewhat difficult Somewhat difficult Somewhat difficult   Assessment & Plan:   Problem List Items Addressed  This Visit     Anxiety attack   Relevant Medications   ALPRAZolam  (XANAX ) 0.5 MG tablet   Dizziness - Primary   Relevant Orders   Ambulatory referral to ENT   Other Visit Diagnoses       Hearing loss of left ear, unspecified hearing loss type       Relevant Orders   Ambulatory referral to ENT     Encounter for immunization       Relevant Orders   Flu vaccine trivalent PF, 6mos and older(Flulaval,Afluria,Fluarix,Fluzone) (Completed)        Meds ordered this encounter  Medications   ALPRAZolam  (XANAX ) 0.5 MG tablet    Sig: Take 1 tablet (0.5 mg total) by mouth 2 (two) times daily as needed for anxiety or sleep.    Dispense:  30 tablet    Refill:  3    Hold until next refill due    Orders Placed This Encounter  Procedures   Flu vaccine trivalent PF, 6mos and older(Flulaval,Afluria,Fluarix,Fluzone)   Ambulatory referral to ENT    Referral Priority:   Routine    Referral Type:   Consultation    Referral Reason:   Specialty Services Required    Requested Specialty:   Otolaryngology    Number of Visits Requested:   1    Patient Instructions  Flu shot today  Hearing screen today.  Orthostatic vital signs today - overall ok Start tapering off gabapentin  - drop to 300mg  three times daily for 1 week then 300mg  twice daily for 1 week then 300mg  nightly for 1 week then stop.   I do recommend further evaluation with head CT and referral to [facility] ENT .   Follow up plan: No follow-ups on file.  Anton Blas, MD

## 2024-08-20 NOTE — Patient Instructions (Addendum)
 Flu shot today  Hearing screen today.  Orthostatic vital signs today - overall ok Start tapering off gabapentin  - drop to 300mg  three times daily for 1 week then 300mg  twice daily for 1 week then 300mg  nightly for 1 week then stop.   I do recommend further evaluation with head CT and referral to [facility] ENT .

## 2024-08-23 DIAGNOSIS — H9193 Unspecified hearing loss, bilateral: Secondary | ICD-10-CM | POA: Insufficient documentation

## 2024-08-23 DIAGNOSIS — R55 Syncope and collapse: Secondary | ICD-10-CM | POA: Insufficient documentation

## 2024-08-23 NOTE — Assessment & Plan Note (Signed)
 Hearing screen today - marked L hearing loss present.  Refer to ENT, order head CT for further evaluation.

## 2024-08-23 NOTE — Assessment & Plan Note (Addendum)
 Refill PRN xanax .  Continue cymbalta  30mg  bid

## 2024-08-23 NOTE — Assessment & Plan Note (Addendum)
 Endorses episode of syncope lasting seconds associated with bilateral ear fullness and progressive dizziness - will further eval with CT head imaging. Will refer to ENT to eval for drop attacks/meniere's disease.

## 2024-08-23 NOTE — Assessment & Plan Note (Addendum)
 Chronic, ongoing, possibly worsening.  Will start taper off gabapentin  to see if any improvement. Given possible association with syncope, will order head imaging starting with noncontrasted CT.  Given associated hearing loss, will refer to ENT eval for meniere's, although doesn't describe typical vertigo.  Orthostatics today - normal.

## 2024-09-13 ENCOUNTER — Ambulatory Visit: Payer: Medicaid Other | Attending: Cardiology

## 2024-09-13 ENCOUNTER — Ambulatory Visit: Payer: Self-pay | Admitting: Cardiology

## 2024-09-13 DIAGNOSIS — R9431 Abnormal electrocardiogram [ECG] [EKG]: Secondary | ICD-10-CM

## 2024-09-13 LAB — ECHOCARDIOGRAM COMPLETE
AR max vel: 2.67 cm2
AV Area VTI: 2.61 cm2
AV Area mean vel: 2.49 cm2
AV Mean grad: 5 mmHg
AV Peak grad: 9.2 mmHg
Ao pk vel: 1.52 m/s
Area-P 1/2: 3.31 cm2
S' Lateral: 3.32 cm

## 2024-10-13 ENCOUNTER — Other Ambulatory Visit: Payer: Self-pay

## 2024-10-13 DIAGNOSIS — Z171 Estrogen receptor negative status [ER-]: Secondary | ICD-10-CM

## 2024-10-14 ENCOUNTER — Inpatient Hospital Stay: Payer: Medicaid Other | Attending: Hematology and Oncology

## 2024-10-14 ENCOUNTER — Inpatient Hospital Stay: Payer: Medicaid Other | Admitting: Hematology and Oncology

## 2024-10-14 ENCOUNTER — Inpatient Hospital Stay: Payer: Medicaid Other

## 2024-10-14 ENCOUNTER — Ambulatory Visit
Admission: RE | Admit: 2024-10-14 | Discharge: 2024-10-14 | Disposition: A | Discharge status: Home / Self Care | Source: Ambulatory Visit | Attending: Hematology and Oncology | Admitting: Hematology and Oncology

## 2024-10-14 DIAGNOSIS — M810 Age-related osteoporosis without current pathological fracture: Secondary | ICD-10-CM | POA: Insufficient documentation

## 2024-10-14 DIAGNOSIS — Y842 Radiological procedure and radiotherapy as the cause of abnormal reaction of the patient, or of later complication, without mention of misadventure at the time of the procedure: Secondary | ICD-10-CM | POA: Insufficient documentation

## 2024-10-14 DIAGNOSIS — I890 Lymphedema, not elsewhere classified: Secondary | ICD-10-CM | POA: Diagnosis not present

## 2024-10-14 DIAGNOSIS — R42 Dizziness and giddiness: Secondary | ICD-10-CM | POA: Insufficient documentation

## 2024-10-14 DIAGNOSIS — C50211 Malignant neoplasm of upper-inner quadrant of right female breast: Secondary | ICD-10-CM

## 2024-10-14 DIAGNOSIS — T671XXA Heat syncope, initial encounter: Secondary | ICD-10-CM

## 2024-10-14 DIAGNOSIS — Z9221 Personal history of antineoplastic chemotherapy: Secondary | ICD-10-CM | POA: Insufficient documentation

## 2024-10-14 DIAGNOSIS — Z853 Personal history of malignant neoplasm of breast: Secondary | ICD-10-CM | POA: Insufficient documentation

## 2024-10-14 DIAGNOSIS — I951 Orthostatic hypotension: Secondary | ICD-10-CM | POA: Diagnosis not present

## 2024-10-14 DIAGNOSIS — R55 Syncope and collapse: Secondary | ICD-10-CM | POA: Diagnosis present

## 2024-10-14 DIAGNOSIS — Z923 Personal history of irradiation: Secondary | ICD-10-CM | POA: Insufficient documentation

## 2024-10-14 DIAGNOSIS — Z171 Estrogen receptor negative status [ER-]: Secondary | ICD-10-CM | POA: Diagnosis not present

## 2024-10-14 DIAGNOSIS — R29818 Other symptoms and signs involving the nervous system: Secondary | ICD-10-CM | POA: Diagnosis not present

## 2024-10-14 DIAGNOSIS — M25519 Pain in unspecified shoulder: Secondary | ICD-10-CM | POA: Diagnosis not present

## 2024-10-14 DIAGNOSIS — G893 Neoplasm related pain (acute) (chronic): Secondary | ICD-10-CM | POA: Diagnosis not present

## 2024-10-14 DIAGNOSIS — R2681 Unsteadiness on feet: Secondary | ICD-10-CM | POA: Diagnosis not present

## 2024-10-14 DIAGNOSIS — C50919 Malignant neoplasm of unspecified site of unspecified female breast: Secondary | ICD-10-CM | POA: Diagnosis not present

## 2024-10-14 LAB — CBC WITH DIFFERENTIAL (CANCER CENTER ONLY)
Abs Immature Granulocytes: 0.02 K/uL (ref 0.00–0.07)
Basophils Absolute: 0.1 K/uL (ref 0.0–0.1)
Basophils Relative: 2 %
Eosinophils Absolute: 0.3 K/uL (ref 0.0–0.5)
Eosinophils Relative: 3 %
HCT: 42.2 % (ref 36.0–46.0)
Hemoglobin: 13.9 g/dL (ref 12.0–15.0)
Immature Granulocytes: 0 %
Lymphocytes Relative: 21 %
Lymphs Abs: 1.8 K/uL (ref 0.7–4.0)
MCH: 28.7 pg (ref 26.0–34.0)
MCHC: 32.9 g/dL (ref 30.0–36.0)
MCV: 87 fL (ref 80.0–100.0)
Monocytes Absolute: 0.6 K/uL (ref 0.1–1.0)
Monocytes Relative: 7 %
Neutro Abs: 5.6 K/uL (ref 1.7–7.7)
Neutrophils Relative %: 67 %
Platelet Count: 404 K/uL — ABNORMAL HIGH (ref 150–400)
RBC: 4.85 MIL/uL (ref 3.87–5.11)
RDW: 13.7 % (ref 11.5–15.5)
WBC Count: 8.3 K/uL (ref 4.0–10.5)
nRBC: 0 % (ref 0.0–0.2)

## 2024-10-14 LAB — CMP (CANCER CENTER ONLY)
ALT: 13 U/L (ref 0–44)
AST: 27 U/L (ref 15–41)
Albumin: 4.5 g/dL (ref 3.5–5.0)
Alkaline Phosphatase: 84 U/L (ref 38–126)
Anion gap: 9 (ref 5–15)
BUN: 9 mg/dL (ref 8–23)
CO2: 27 mmol/L (ref 22–32)
Calcium: 9.9 mg/dL (ref 8.9–10.3)
Chloride: 102 mmol/L (ref 98–111)
Creatinine: 1.01 mg/dL — ABNORMAL HIGH (ref 0.44–1.00)
GFR, Estimated: >60 mL/min (ref >60)
Glucose, Bld: 91 mg/dL (ref 70–99)
Potassium: 4.7 mmol/L (ref 3.5–5.1)
Sodium: 138 mmol/L (ref 135–145)
Total Bilirubin: 0.4 mg/dL (ref 0.0–1.2)
Total Protein: 7.8 g/dL (ref 6.5–8.1)

## 2024-10-14 MED ORDER — ZOLEDRONIC ACID 4 MG/100ML IV SOLN
4.0000 mg | Freq: Once | INTRAVENOUS | Status: AC
  Administered 2024-10-14: 4 mg via INTRAVENOUS
  Filled 2024-10-14: qty 100

## 2024-10-14 MED ORDER — GADOBUTROL 1 MMOL/ML IV SOLN
8.0000 mL | Freq: Once | INTRAVENOUS | Status: AC | PRN
  Administered 2024-10-14: 8 mL via INTRAVENOUS

## 2024-10-14 NOTE — Progress Notes (Signed)
 Patient Care Team: Rilla Baller, MD as PCP - General (Family Medicine) Kate Lonni CROME, MD as PCP - Cardiology (Cardiology) Odean Potts, MD as Consulting Physician (Hematology and Oncology) Shannon Agent, MD as Consulting Physician (Radiation Oncology) Curvin Deward MOULD, MD as Consulting Physician (General Surgery) Crawford Morna Pickle, NP as Nurse Practitioner (Hematology and Oncology)  DIAGNOSIS:  Encounter Diagnosis  Name Primary?   Malignant neoplasm of upper-inner quadrant of right breast in female, estrogen receptor negative (HCC) Yes    SUMMARY OF ONCOLOGIC HISTORY: Oncology History  History of left breast cancer  06/05/2021 Initial Biopsy   History of left breast cancer status postlumpectomy 2003 Pain in the right breast: Mammogram and ultrasound revealed indeterminate masses in the right breast spanning 2.4 cm biopsy revealed grade 3 IDC ER 5%, PR 0%, Ki67 60%, HER2 negative   Pain of left breast (Resolved)  Malignant neoplasm of upper-inner quadrant of right breast in female, estrogen receptor negative (HCC)  06/05/2021 Initial Biopsy   History of left breast cancer status postlumpectomy 2003 Pain in the right breast: Mammogram and ultrasound revealed indeterminate masses in the right breast spanning 2.4 cm biopsy revealed grade 3 IDC ER 5%, PR 0%, Ki67 60%, HER2 negative   06/11/2021 Cancer Staging   Staging form: Breast, AJCC 8th Edition - Clinical stage from 06/11/2021: Stage IIB (cT2, cN0, cM0, G3, ER-, PR-, HER2-) - Signed by Odean Potts, MD on 06/11/2021 Stage prefix: Initial diagnosis Histologic grading system: 3 grade system   06/19/2021 - 08/11/2021 Chemotherapy   Neoadjuvant chemotherapy: Taxotere  and Cytoxan  x4 cycles   10/05/2021 Surgery   Right lumpectomy: 0.4 cm grade 2 IDC with DCIS 0/6 LN Neg, ER 0%, PR 0%, Ki-67 5%, HER2 0   11/09/2021 - 12/24/2021 Radiation Therapy   Site Technique Total Dose (Gy) Dose per Fx (Gy) Completed Fx Beam Energies   Breast, Right: Breast_R 3D 50.4/50.4 1.8 28/28 10X  Breast, Right: Breast_R_Bst 3D 10/10 2 5/5 6X, 10X       01/02/2022 - 02/21/2022 Chemotherapy    Adjuvant capecitabine  (couldn't tolerate the side effects: Nausea)     CHIEF COMPLIANT: Surveillance of breast cancer, complains of dizziness and syncope  HISTORY OF PRESENT ILLNESS:   History of Present Illness Sabrina Wright is a 63 year old female with 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  Her last mammogram in August was normal with breast density B. She does daily self-exams without new pain or masses. A recent CT for lung nodules was unremarkable.  She has persistent constant right chest wall pain outside the breast that is unchanged for several years and refractory to dry needling and physical therapy. Prior cardiology evaluation was unremarkable.  She has fluctuating right upper extremity swelling consistent with lymphedema. She manages this with self-massage as taught in physical therapy and can now put on her bra independently, though swelling worsens if she is not careful.  She reports recurrent dizziness on standing with three to four episodes in the past year, including one fall and additional near-syncope. Episodes are sometimes associated with transient hearing loss and confusion. She has persistent unilateral hearing loss with prior audiologic evaluation and a pending specialist referral. Gabapentin  was stopped, and she is not taking current medications. She notes ongoing memory difficulties, which she attributes to prior chemotherapy.  She discussed osteoporosis management in the setting of insurance  changes and medication cost concerns. She has previously received zoledronic  acid and denosumab , with coverage influencing choice, and  requested the medication name to verify insurance and consider switching back to denosumab  if covered.  Oct 13, 2023: Surveillance visit for right breast cancer and osteoporosis. Patient remains in remission after neoadjuvant chemotherapy, right lumpectomy, and adjuvant radiation; capecitabine  discontinued due to nausea. Persistent right shoulder and arm pain managed with gabapentin  and Cymbalta ; referral to physical therapy for dry needling. Profound osteoporosis (T-score -3.4), Zometa  infusions recommended every six months; follow-up planned in six months.     ALLERGIES:  is allergic to codeine, egg protein-containing drug products, and gadavist  [gadobutrol ].  MEDICATIONS:  Current Outpatient Medications  Medication Sig Dispense Refill   acetaminophen  (TYLENOL ) 325 MG tablet Take 2 tablets (650 mg total) by mouth every 6 (six) hours as needed for fever, headache or moderate pain.     ALPRAZolam  (XANAX ) 0.5 MG tablet Take 1 tablet (0.5 mg total) by mouth 2 (two) times daily as needed for anxiety or sleep. 30 tablet 3   Calcium  Carb-Cholecalciferol  (CALCIUM  600/VITAMIN D3) 600-800 MG-UNIT TABS Take 2 tablets by mouth daily. 60 tablet    Cholecalciferol  (VITAMIN D ) 50 MCG (2000 UT) CAPS Take 1 capsule (2,000 Units total) by mouth daily. 30 capsule    cyanocobalamin  (V-R VITAMIN B-12) 500 MCG tablet Take 2 tablets (1,000 mcg total) by mouth daily.     DULoxetine  (CYMBALTA ) 30 MG capsule Take 1 capsule (30 mg total) by mouth 2 (two) times daily. 180 capsule 3   levocetirizine (XYZAL ) 5 MG tablet Take 1 tablet (5 mg total) by mouth every evening. 30 tablet 0   Multiple Vitamin (MULTIVITAMIN WITH MINERALS) TABS tablet Take 1 tablet by mouth at bedtime.     nortriptyline  (PAMELOR ) 50 MG capsule Take 1 capsule (50 mg total) by mouth at bedtime. 90 capsule 3   SUMAtriptan  (IMITREX ) 100 MG tablet TAKE 1 TABLET BY MOUTH ONCE FOR 1 DOSE. MAY REPEAT IN 2 HOURS IF HEADACHE PERSISTS OR RECURS. 9 tablet 5    triamcinolone  cream (KENALOG ) 0.1 % Apply 1 Application topically 2 (two) times daily. To affected itchy area 30 g 0   valACYclovir  (VALTREX ) 1000 MG tablet Take 1 tablet (1,000 mg total) by mouth 3 (three) times daily. 21 tablet 0   No current facility-administered medications for this visit.    PHYSICAL EXAMINATION: ECOG PERFORMANCE STATUS: 1 - Symptomatic but completely ambulatory  Vitals:   10/14/24 0941  BP: 119/72  Pulse: 78  Resp: 18  Temp: 97.9 F (36.6 C)  SpO2: 95%   Filed Weights   10/14/24 0941  Weight: 180 lb 3.2 oz (81.7 kg)    Physical Exam BREAST: Mild lymphedema.  (exam performed in the presence of a chaperone)  LABORATORY DATA:  I have reviewed the data as listed    Latest Ref Rng & Units 10/14/2024    9:15 AM 04/13/2024    1:29 PM 10/13/2023    8:43 AM  CMP  Glucose 70 - 99 mg/dL 91  899  78   BUN 8 - 23 mg/dL 9  10  10    Creatinine 0.44 - 1.00 mg/dL 8.98  9.10  9.04   Sodium 135 - 145 mmol/L 138  136  137   Potassium 3.5 - 5.1 mmol/L 4.7  4.1  4.3   Chloride 98 - 111 mmol/L 102  104  103   CO2 22 - 32 mmol/L 27  26  27  Calcium  8.9 - 10.3 mg/dL 9.9  9.1  9.5   Total Protein 6.5 - 8.1 g/dL 7.8  7.3  7.3   Total Bilirubin 0.0 - 1.2 mg/dL 0.4  0.3  0.4   Alkaline Phos 38 - 126 U/L 84  77  110   AST 15 - 41 U/L 27  21  17    ALT 0 - 44 U/L 13  13  11      Lab Results  Component Value Date   WBC 8.3 10/14/2024   HGB 13.9 10/14/2024   HCT 42.2 10/14/2024   MCV 87.0 10/14/2024   PLT 404 (H) 10/14/2024   NEUTROABS 5.6 10/14/2024    ASSESSMENT & PLAN:  Malignant neoplasm of upper-inner quadrant of right breast in female, estrogen receptor negative (HCC)   History of left breast cancer status postlumpectomy 2003 status post chemo with Adriamycin, radiation 06/05/2021: Pain in the right breast: Mammogram and ultrasound revealed indeterminate masses in the right breast spanning 2.4 cm biopsy revealed grade 3 IDC ER 5%, PR 0%, Ki67 60%, HER2  negative   Treatment plan: 1.  Neoadjuvant chemotherapy with Taxotere  and Cytoxan  x4 (06/19/21- 08/21/21) 2. 10/05/2021: Breast conserving surgery with sentinel lymph node biopsy: 0.4 cm grade 2 IDC with DCIS 0/6 LN Neg, ER 0%, PR 0%, Ki-67 5%, HER2 0  3.  Adjuvant radiation therapy 11/09/2021-12/24/2021 4.  Based on final pathology being ER negative, I did not recommend antiestrogen therapy.  Capecitabine  started 01/02/2022 discontinued 02/21/2022 because of adverse effects especially nausea ----------------------------------------------------------------------------------------------------------------------- Right shoulder and breast pain: Constant in nature. Inspite of gabapentin  and Cymbalta .   physical therapy for dry needling.  She will also try CBD oil.   Breast cancer surveillance: Breast exam 10/14/2024: Benign Mammogram 06/04/2024: Benign breast density category B CT chest 06/18/2024: Tiny lung nodules resolved or stable  Bone density April 2024: T-score -3.4: Profound osteoporosis: recommended Zometa  infusions every 6 months.    Chronic Pain Persistent pain in the shoulder and arm     Osteoporosis Previous use of Prolia  discontinued due to insurance coverage.  Currently on Zometa .  Dizziness and syncope: I recommended that we obtain a brain MRI today stat   Assessment & Plan Malignant neoplasm of upper-inner quadrant of right breast, estrogen receptor negative  New neurologic symptoms under evaluation. - Reviewed recent mammogram and CT scan. - Performed breast and axillary examination. - Ordered MRI brain to evaluate for central causes of new neurologic symptoms. - Coordinated MRI scheduling and will communicate results. - Continue breast exam and imaging surveillance every 6 months. - Scheduled follow-up in six months.  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  Lymphedema of  right upper extremity Mild persistent lymphedema with improved function through self-care, though swelling increases without adherence. - Reinforced lymphedema self-massage techniques.  Osteoporosis Managed as part of survivorship care with prior zoledronic  acid. New insurance may allow transition to denosumab . - Provided medication name (zoledronic  acid) for insurance verification. - Discussed denosumab  as an alternative if insurance coverage is preferable and noted potential for improved tolerability. - Scheduled next infusion in six months.  Orthostatic hypotension with recurrent syncope Recurrent dizziness and syncope. - Coordinated MRI scheduling and will communicate results. - Advised continuation of fall precautions.      No orders of the defined types were placed in this encounter.  The patient has a good understanding of the overall plan. she agrees with it. she will call  with any problems that may develop before the next visit here.  I personally spent a total of 30 minutes in the care of the patient today including preparing to see the patient, getting/reviewing separately obtained history, performing a medically appropriate exam/evaluation, counseling and educating, placing orders, referring and communicating with other health care professionals, documenting clinical information in the EHR, independently interpreting results, communicating results, and coordinating care.   Viinay K Aniket Paye, MD 10/14/2024

## 2024-10-14 NOTE — Patient Instructions (Addendum)
 Brain MRI tonight 12/11 at 7 pm at [facility]. Arrive [address].  Call [phone] option 2 to schedule another time if the above doesn't work for you.  Zoledronic  Acid Injection (Cancer) What is this medication? ZOLEDRONIC  ACID (ZOE le dron ik AS id) treats high calcium  levels in the blood caused by cancer. It may also be used with chemotherapy to treat weakened bones caused by cancer. It works by slowing down the release of calcium  from bones. This lowers calcium  levels in your blood. It also makes your bones stronger and less likely to break (fracture). It belongs to a group of medications called bisphosphonates. This medicine may be used for other purposes; ask your health care provider or pharmacist if you have questions. COMMON BRAND NAME(S): Zometa , Zometa  Powder What should I tell my care team before I take this medication? They need to know if you have any of these conditions: Dehydration Dental disease Kidney disease Liver disease Low levels of calcium  in the blood Lung or breathing disease, such as asthma Receiving steroids, such as dexamethasone  or prednisone  An unusual or allergic reaction to zoledronic  acid, other medications, foods, dyes, or preservatives Pregnant or trying to get pregnant Breast-feeding How should I use this medication? This medication is injected into a vein. It is given by your care team in a hospital or clinic setting. Talk to your care team about the use of this medication in children. Special care may be needed. Overdosage: If you think you have taken too much of this medicine contact a poison control center or emergency room at once. NOTE: This medicine is only for you. Do not share this medicine with others. What if I miss a dose? Keep appointments for follow-up doses. It is important not to miss your dose. Call your care team if you are unable to keep an appointment. What may interact with this medication? Certain  antibiotics given by injection Diuretics, such as bumetanide, furosemide NSAIDs, medications for pain and inflammation, such as ibuprofen or naproxen  Teriparatide Thalidomide This list may not describe all possible interactions. Give your health care provider a list of all the medicines, herbs, non-prescription drugs, or dietary supplements you use. Also tell them if you smoke, drink alcohol, or use illegal drugs. Some items may interact with your medicine. What should I watch for while using this medication? Visit your care team for regular checks on your progress. It may be some time before you see the benefit from this medication. Some people who take this medication have severe bone, joint, or muscle pain. This medication may also increase your risk for jaw problems or a broken thigh bone. Tell your care team right away if you have severe pain in your jaw, bones, joints, or muscles. Tell you care team if you have any pain that does not go away or that gets worse. Tell your dentist and dental surgeon that you are taking this medication. You should not have major dental surgery while on this medication. See your dentist to have a dental exam and fix any dental problems before starting this medication. Take good care of your teeth while on this medication. Make sure you see your dentist for regular follow-up appointments. You should make sure you get enough calcium  and vitamin D  while you are taking this medication. Discuss the foods you eat and the vitamins you take with your care team. Check with your care team if you have severe diarrhea, nausea, and vomiting, or if  you sweat a lot. The loss of too much body fluid may make it dangerous for you to take this medication. You may need bloodwork while taking this medication. Talk to your care team if you wish to become pregnant or think you might be pregnant. This medication can cause serious birth defects. What side effects may I notice from receiving  this medication? Side effects that you should report to your care team as soon as possible: Allergic reactions--skin rash, itching, hives, swelling of the face, lips, tongue, or throat Kidney injury--decrease in the amount of urine, swelling of the ankles, hands, or feet Low calcium  level--muscle pain or cramps, confusion, tingling, or numbness in the hands or feet Osteonecrosis of the jaw--pain, swelling, or redness in the mouth, numbness of the jaw, poor healing after dental work, unusual discharge from the mouth, visible bones in the mouth Severe bone, joint, or muscle pain Side effects that usually do not require medical attention (report to your care team if they continue or are bothersome): Constipation Fatigue Fever Loss of appetite Nausea Stomach pain This list may not describe all possible side effects. Call your doctor for medical advice about side effects. You may report side effects to FDA at 1-800-FDA-1088. Where should I keep my medication? This medication is given in a hospital or clinic. It will not be stored at home. NOTE: This sheet is a summary. It may not cover all possible information. If you have questions about this medicine, talk to your doctor, pharmacist, or health care provider.  2024 Elsevier/Gold Standard (2021-12-14 00:00:00)

## 2024-10-14 NOTE — Assessment & Plan Note (Signed)
° °  History of left breast cancer status postlumpectomy 2003 status post chemo with Adriamycin, radiation 06/05/2021: Pain in the right breast: Mammogram and ultrasound revealed indeterminate masses in the right breast spanning 2.4 cm biopsy revealed grade 3 IDC ER 5%, PR 0%, 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  Neoadjuvant chemotherapy with Taxotere  and Cytoxan  x4 (06/19/21- 08/21/21) 2. 10/05/2021: Breast conserving surgery with sentinel lymph node biopsy: 0.4 cm grade 2 IDC with DCIS 0/6 LN Neg, ER 0%, PR 0%, Ki-67 5%, HER2 0  3.  Adjuvant radiation therapy 11/09/2021-12/24/2021 4.  Based on final pathology being ER negative, I did not recommend antiestrogen therapy.  Capecitabine  started 01/02/2022 discontinued 02/21/2022 because of adverse effects especially nausea ----------------------------------------------------------------------------------------------------------------------- Right shoulder and breast pain: Constant in nature.  This is fine of gabapentin  and Cymbalta .  I will send a referral to physical therapy for dry needling.  She will also try CBD oil.   Breast cancer surveillance: Breast exam 10/14/2024: Benign Mammogram 06/04/2024: Benign breast density category B CT chest 06/18/2024: Tiny lung nodules resolved or stable  Bone density April 2024: T-score -3.4: Profound osteoporosis: recommended Zometa  infusions every 6 months.    Chronic Pain Persistent pain in the shoulder and arm despite gabapentin  and Cymbalta . Discussed the potential benefits of CBD oil and dry needling.   Osteoporosis Previous use of Prolia  discontinued due to insurance coverage. Discussed the benefits of Zometa  infusions. -Start Zometa  infusions every six months.

## 2024-10-15 ENCOUNTER — Ambulatory Visit: Payer: Self-pay | Admitting: Hematology and Oncology

## 2024-10-15 ENCOUNTER — Telehealth: Payer: Self-pay | Admitting: Hematology and Oncology

## 2024-10-15 NOTE — Telephone Encounter (Signed)
 I informed the patient of the brain MRI is normal.  She has an ENT appointment coming up next month to evaluate if the cause of the dizziness could be from the ears. She also informed me that the Zometa  will be covered by her new insurance.

## 2024-10-15 NOTE — Telephone Encounter (Signed)
 left vm for pt to call and schedule f/u appts per 12/11 los

## 2024-10-29 ENCOUNTER — Other Ambulatory Visit: Payer: Self-pay | Admitting: Hematology and Oncology

## 2024-11-08 ENCOUNTER — Encounter (INDEPENDENT_AMBULATORY_CARE_PROVIDER_SITE_OTHER): Payer: Self-pay | Admitting: Physician Assistant

## 2024-11-08 ENCOUNTER — Ambulatory Visit (INDEPENDENT_AMBULATORY_CARE_PROVIDER_SITE_OTHER): Admitting: Physician Assistant

## 2024-11-08 ENCOUNTER — Encounter: Payer: Self-pay | Admitting: Hematology and Oncology

## 2024-11-08 VITALS — BP 110/72 | HR 87 | Temp 98.0°F | Ht 65.0 in | Wt 180.0 lb

## 2024-11-08 DIAGNOSIS — H9192 Unspecified hearing loss, left ear: Secondary | ICD-10-CM

## 2024-11-08 DIAGNOSIS — H9042 Sensorineural hearing loss, unilateral, left ear, with unrestricted hearing on the contralateral side: Secondary | ICD-10-CM | POA: Diagnosis not present

## 2024-11-08 NOTE — Progress Notes (Signed)
 Patient explains that her BP usually is lower.
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
## 2024-12-10 ENCOUNTER — Ambulatory Visit (INDEPENDENT_AMBULATORY_CARE_PROVIDER_SITE_OTHER): Admitting: Audiology

## 2024-12-10 ENCOUNTER — Encounter (INDEPENDENT_AMBULATORY_CARE_PROVIDER_SITE_OTHER): Payer: Self-pay | Admitting: Physician Assistant

## 2024-12-10 ENCOUNTER — Ambulatory Visit (INDEPENDENT_AMBULATORY_CARE_PROVIDER_SITE_OTHER): Admitting: Physician Assistant

## 2024-12-10 VITALS — BP 95/66 | HR 80 | Temp 97.2°F

## 2024-12-10 DIAGNOSIS — H903 Sensorineural hearing loss, bilateral: Secondary | ICD-10-CM

## 2024-12-10 NOTE — Progress Notes (Signed)
" °  [address], [address] [place], [place] [address] [phone]  Audiological Evaluation    Name: Sabrina Wright     DOB:   [birth year]      MRN:   [id number]                                                                                     Service Date: 12/10/2024     Accompanied by: self     Patient comes today after Reyes Cohen, PA-C sent a referral for a hearing evaluation due to concerns with hearing loss asymmetry.   Symptoms Yes Details  Hearing loss  [x]  Perceive shearing loss worse in the left ear  Tinnitus  [x]  Sometimes in both ears  Ear pain/ infections/pressure  [x]  Sometimes ear pressure   Balance problems  [x]  Dizzy, onset was 4-5 years ago and reports that trigger may be standing up and the sensation lasts a few seconds. Patient reports she thought it was due to gabapentin   Noise exposure history  []    Previous ear surgeries  []    Family history of hearing loss  []    Amplification  []    Other  []  Reported had chemotherapy after she had breast cancer.    Otoscopy: Right ear: clear external ear canal and notable landmarks visualized on the tympanic membrane. Left ear:  clear external ear canal and notable landmarks visualized on the tympanic membrane.  Tympanometry: Right ear: Type A - Normal external ear canal volume with normal middle ear pressure and normal tympanic membrane compliance. Findings are consistent with normal middle ear function. Left ear: Type A - Normal external ear canal volume with normal middle ear pressure and normal tympanic membrane compliance. Findings are consistent with normal middle ear function.  Hearing Evaluation The hearing test results were completed under headphones and results are deemed to be of good reliability. Test technique:  conventional    Pure tone Audiometry: Right ear- Mild to moderate sensorineural hearing loss from 250 Hz - 8000 Hz. Left ear-  Mild to moderate sensorineural hearing loss from 250 Hz - 8000  Hz.  Speech Audiometry: Right ear- Speech Reception Threshold (SRT) was obtained at 45 dBHL. Left ear-Speech Reception Threshold (SRT) was obtained at 40 dBHL.   Word Recognition Score Tested using NU-6 (recorded) Right ear: 96% was obtained at a presentation level of 85 dBHL with contralateral masking which is deemed as  excellent. Left ear: 100% was obtained at a presentation level of 80 dBHL with contralateral masking which is deemed as  excellent.   Impression: There is not a significant difference in pure-tone thresholds between ears., There is not a significant difference in the word recognition score in between ears.    Recommendations: Follow up with ENT as scheduled. Return for a hearing evaluation if concerns with hearing changes arise or per MD recommendation. Consider a communication needs assessment for amplification after medical clearance is obtained, if needed.   Hunner Garcon MARIE LEROUX-MARTINEZ, AUD  "
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
## 2025-04-15 ENCOUNTER — Inpatient Hospital Stay

## 2025-10-17 ENCOUNTER — Inpatient Hospital Stay: Admitting: Hematology and Oncology
